# Patient Record
Sex: Male | Born: 1955
Health system: Southern US, Community
[De-identification: ages and names within clinical notes are randomized; demographics above are authoritative.]

## PROBLEM LIST (undated history)

## (undated) DIAGNOSIS — F32A Depression, unspecified: Secondary | ICD-10-CM

## (undated) DIAGNOSIS — M109 Gout, unspecified: Secondary | ICD-10-CM

## (undated) DIAGNOSIS — I2510 Atherosclerotic heart disease of native coronary artery without angina pectoris: Secondary | ICD-10-CM

## (undated) DIAGNOSIS — K8020 Calculus of gallbladder without cholecystitis without obstruction: Secondary | ICD-10-CM

## (undated) DIAGNOSIS — I480 Paroxysmal atrial fibrillation: Secondary | ICD-10-CM

## (undated) DIAGNOSIS — N189 Chronic kidney disease, unspecified: Secondary | ICD-10-CM

## (undated) DIAGNOSIS — K219 Gastro-esophageal reflux disease without esophagitis: Secondary | ICD-10-CM

## (undated) DIAGNOSIS — I10 Essential (primary) hypertension: Secondary | ICD-10-CM

## (undated) DIAGNOSIS — J45909 Unspecified asthma, uncomplicated: Secondary | ICD-10-CM

## (undated) DIAGNOSIS — F419 Anxiety disorder, unspecified: Secondary | ICD-10-CM

## (undated) DIAGNOSIS — M1990 Unspecified osteoarthritis, unspecified site: Secondary | ICD-10-CM

## (undated) DIAGNOSIS — T7840XA Allergy, unspecified, initial encounter: Secondary | ICD-10-CM

## (undated) DIAGNOSIS — E785 Hyperlipidemia, unspecified: Secondary | ICD-10-CM

## (undated) HISTORY — PX: CORONARY ANGIOPLASTY WITH STENT PLACEMENT: SHX49

## (undated) HISTORY — DX: Depression, unspecified: F32.A

## (undated) HISTORY — DX: Unspecified osteoarthritis, unspecified site: M19.90

## (undated) HISTORY — DX: Allergy, unspecified, initial encounter: T78.40XA

## (undated) HISTORY — DX: Unspecified asthma, uncomplicated: J45.909

## (undated) HISTORY — DX: Chronic kidney disease, unspecified: N18.9

## (undated) HISTORY — DX: Gastro-esophageal reflux disease without esophagitis: K21.9

## (undated) HISTORY — PX: TONSILLECTOMY: SUR1361

## (undated) HISTORY — PX: BLADDER REPAIR: SHX76

## (undated) HISTORY — DX: Anxiety disorder, unspecified: F41.9

---

## 2009-09-10 DIAGNOSIS — I219 Acute myocardial infarction, unspecified: Secondary | ICD-10-CM

## 2009-09-10 HISTORY — DX: Acute myocardial infarction, unspecified: I21.9

## 2013-11-16 ENCOUNTER — Emergency Department (HOSPITAL_BASED_OUTPATIENT_CLINIC_OR_DEPARTMENT_OTHER): Payer: 59

## 2013-11-16 ENCOUNTER — Emergency Department (HOSPITAL_BASED_OUTPATIENT_CLINIC_OR_DEPARTMENT_OTHER)
Admission: EM | Admit: 2013-11-16 | Discharge: 2013-11-16 | Disposition: A | Discharge status: Home / Self Care | Payer: 59 | Attending: Emergency Medicine | Admitting: Emergency Medicine

## 2013-11-16 ENCOUNTER — Encounter (HOSPITAL_BASED_OUTPATIENT_CLINIC_OR_DEPARTMENT_OTHER): Payer: Self-pay | Admitting: Emergency Medicine

## 2013-11-16 DIAGNOSIS — M79642 Pain in left hand: Secondary | ICD-10-CM

## 2013-11-16 DIAGNOSIS — M109 Gout, unspecified: Secondary | ICD-10-CM

## 2013-11-16 DIAGNOSIS — I10 Essential (primary) hypertension: Secondary | ICD-10-CM | POA: Insufficient documentation

## 2013-11-16 DIAGNOSIS — Z79899 Other long term (current) drug therapy: Secondary | ICD-10-CM | POA: Insufficient documentation

## 2013-11-16 HISTORY — DX: Essential (primary) hypertension: I10

## 2013-11-16 HISTORY — DX: Hyperlipidemia, unspecified: E78.5

## 2013-11-16 HISTORY — DX: Gout, unspecified: M10.9

## 2013-11-16 LAB — BASIC METABOLIC PANEL
BUN: 16 mg/dL (ref 6–23)
CO2: 25 mEq/L (ref 19–32)
Calcium: 10.1 mg/dL (ref 8.4–10.5)
Chloride: 101 mEq/L (ref 96–112)
Creatinine, Ser: 0.8 mg/dL (ref 0.50–1.35)
Glucose, Bld: 97 mg/dL (ref 70–99)
POTASSIUM: 4.7 meq/L (ref 3.7–5.3)
Sodium: 139 mEq/L (ref 137–147)

## 2013-11-16 MED ORDER — PREDNISONE 50 MG PO TABS: 60.0000 mg | ORAL_TABLET | Freq: Once | ORAL | Status: DC

## 2013-11-16 MED ORDER — PREDNISONE 20 MG PO TABS: ORAL_TABLET | ORAL | Status: DC

## 2013-11-16 MED ORDER — COLCHICINE 0.6 MG PO TABS: 1.2000 mg | ORAL_TABLET | Freq: Once | ORAL | Status: DC

## 2013-11-16 MED ORDER — PREDNISONE 50 MG PO TABS
60.0000 mg | ORAL_TABLET | Freq: Once | ORAL | Status: AC
  Administered 2013-11-16: 60 mg via ORAL
  Filled 2013-11-16 (×2): qty 1

## 2013-11-16 NOTE — ED Provider Notes (Signed)
CSN: [id number]     Arrival date & time 11/16/13  [id number] History   First MD Initiated Contact with Patient 11/16/13 [phone]     Chief Complaint  Patient presents with  . left hand pain ?gout      (Consider location/radiation/quality/duration/timing/severity/associated sxs/prior Treatment) HPI Comments: 58 year old male presents with 3 days of left hand and wrist pain and swelling. He states it feels like his prior gout. He's been diagnosed with gout typically has flares in his right foot in his left hand. States his head and his left hand similar to this about 4 or 5 times. He states the pain is currently a 10 out of 10. He has been taking Excedrin and ibuprofen. He's been trying to cut back on the ibuprofen. Typically ibuprofen is enough in his past flares. He states he's been to his doctor before and gotten an injection. Denies any injury. It hurts to move his hand. There's been no erythema or fevers. No wounds.   Past Medical History  Diagnosis Date  . Hypertension   . Gout   . Hyperlipidemia    History reviewed. No pertinent past surgical history. History reviewed. No pertinent family history. History  Substance Use Topics  . Smoking status: Never Smoker   . Smokeless tobacco: Not on file  . Alcohol Use: No    Review of Systems  Constitutional: Negative for fever.  Musculoskeletal: Positive for joint swelling.  Skin: Negative for color change and wound.      Allergies  Review of patient's allergies indicates no known allergies.  Home Medications   Current Outpatient Rx  Name  Route  Sig  Dispense  Refill  . losartan (COZAAR) 50 MG tablet   Oral   Take 50 mg by mouth 2 (two) times daily.          BP 140/92  Pulse 84  Temp(Src) 97.9 F (36.6 C) (Oral)  Resp 20  SpO2 98% Physical Exam  Nursing note and vitals reviewed. Constitutional: He is oriented to person, place, and time. He appears well-developed and well-nourished.  HENT:  Head: Normocephalic and  atraumatic.  Right Ear: External ear normal.  Left Ear: External ear normal.  Nose: Nose normal.  Eyes: Right eye exhibits no discharge. Left eye exhibits no discharge.  Cardiovascular: Normal rate and intact distal pulses.   Pulses:      Radial pulses are 2+ on the right side, and 2+ on the left side.  Pulmonary/Chest: Effort normal.  Abdominal: He exhibits no distension.  Musculoskeletal:       Left wrist: He exhibits tenderness and swelling (mild).       Left hand: He exhibits tenderness and swelling (diffuse).  Neurological: He is alert and oriented to person, place, and time.  Skin: Skin is warm and dry.    ED Course  Procedures (including critical care time) Labs Review Labs Reviewed  BASIC METABOLIC PANEL   Imaging Review Dg Forearm Left  11/16/2013   CLINICAL DATA:  Pain and swelling  EXAM: LEFT FOREARM - 2 VIEW  COMPARISON:  None.  FINDINGS: Frontal and lateral views were obtained. There is no fracture or dislocation. No abnormal periosteal reaction. There is slight osteoarthritic change in the wrist and elbow joints. No erosive change or intra-articular calcification.  IMPRESSION: Areas of mild osteoarthritic change. No erosive change or intra-articular calcification. No fracture or dislocation.   Electronically Signed   By: Bretta BangWilliam  Woodruff M.D.   On: 11/16/2013 09:59   Dg Hand Complete  Left  11/16/2013   CLINICAL DATA:  Pain and swelling  EXAM: LEFT HAND - COMPLETE 3+ VIEW  COMPARISON:  None.  FINDINGS: Frontal, oblique, and lateral views were obtained. There is no fracture or dislocation. There is mild osteoarthritic change in all PIP and DIP joints. There is also mild osteoarthritic change in the midcarpal -metacarpal region and in the radiocarpal joint. There is no erosive change or intra-articular calcification.  IMPRESSION: Multifocal osteoarthritic change. No erosive change or intra-articular calcification. No fracture or dislocation.   Electronically Signed   By: Bretta Bang M.D.   On: 11/16/2013 09:59     EKG Interpretation None      MDM   Final diagnoses:  Left hand pain  Gout    Patient's hand does not have clinical signs of infection. The patient states is a recurrent problem for him and states she's been told his gout has not had formal testing. I do not see any acute indication for arthrocentesis. There is no sign of any joint effusion. Given that his symptoms started over 72 hours ago I do not feel colchicine would be helpful. History taking NSAIDs without significant relief. At this time we'll give burst of prednisone and recommend close followup with his PCP.    Audree Camel, MD 11/16/13 [phone]

## 2013-11-16 NOTE — ED Notes (Signed)
Left hand pain and swelling onset Friday pt states history of gout and is always in left hand or right foot

## 2020-04-09 ENCOUNTER — Observation Stay (HOSPITAL_COMMUNITY)
Admission: EM | Admit: 2020-04-09 | Discharge: 2020-04-11 | Disposition: A | Discharge status: Home / Self Care | Payer: Medicare Other | Attending: Internal Medicine | Admitting: Internal Medicine

## 2020-04-09 ENCOUNTER — Other Ambulatory Visit: Payer: Self-pay

## 2020-04-09 ENCOUNTER — Encounter (HOSPITAL_COMMUNITY): Payer: Self-pay | Admitting: Emergency Medicine

## 2020-04-09 DIAGNOSIS — R1013 Epigastric pain: Secondary | ICD-10-CM | POA: Diagnosis present

## 2020-04-09 DIAGNOSIS — I10 Essential (primary) hypertension: Secondary | ICD-10-CM | POA: Insufficient documentation

## 2020-04-09 DIAGNOSIS — I2510 Atherosclerotic heart disease of native coronary artery without angina pectoris: Secondary | ICD-10-CM | POA: Insufficient documentation

## 2020-04-09 DIAGNOSIS — I160 Hypertensive urgency: Secondary | ICD-10-CM

## 2020-04-09 DIAGNOSIS — Z7982 Long term (current) use of aspirin: Secondary | ICD-10-CM | POA: Diagnosis not present

## 2020-04-09 DIAGNOSIS — R079 Chest pain, unspecified: Secondary | ICD-10-CM

## 2020-04-09 DIAGNOSIS — Z20822 Contact with and (suspected) exposure to covid-19: Secondary | ICD-10-CM | POA: Diagnosis not present

## 2020-04-09 DIAGNOSIS — K625 Hemorrhage of anus and rectum: Secondary | ICD-10-CM | POA: Diagnosis not present

## 2020-04-09 DIAGNOSIS — R109 Unspecified abdominal pain: Secondary | ICD-10-CM | POA: Diagnosis present

## 2020-04-09 HISTORY — DX: Paroxysmal atrial fibrillation: I48.0

## 2020-04-09 HISTORY — DX: Atherosclerotic heart disease of native coronary artery without angina pectoris: I25.10

## 2020-04-09 HISTORY — DX: Calculus of gallbladder without cholecystitis without obstruction: K80.20

## 2020-04-09 LAB — COMPREHENSIVE METABOLIC PANEL
ALT: 48 U/L — ABNORMAL HIGH (ref 0–44)
AST: 47 U/L — ABNORMAL HIGH (ref 15–41)
Albumin: 4 g/dL (ref 3.5–5.0)
Alkaline Phosphatase: 75 U/L (ref 38–126)
Anion gap: 12 (ref 5–15)
BUN: 33 mg/dL — ABNORMAL HIGH (ref 8–23)
CO2: 22 mmol/L (ref 22–32)
Calcium: 9.2 mg/dL (ref 8.9–10.3)
Chloride: 109 mmol/L (ref 98–111)
Creatinine, Ser: 2.06 mg/dL — ABNORMAL HIGH (ref 0.61–1.24)
GFR calc Af Amer: 38 mL/min — ABNORMAL LOW (ref >60)
GFR calc non Af Amer: 33 mL/min — ABNORMAL LOW (ref >60)
Glucose, Bld: 101 mg/dL — ABNORMAL HIGH (ref 70–99)
Potassium: 3.4 mmol/L — ABNORMAL LOW (ref 3.5–5.1)
Sodium: 143 mmol/L (ref 135–145)
Total Bilirubin: 0.8 mg/dL (ref 0.3–1.2)
Total Protein: 6.5 g/dL (ref 6.5–8.1)

## 2020-04-09 LAB — URINALYSIS, ROUTINE W REFLEX MICROSCOPIC
Bilirubin Urine: NEGATIVE
Glucose, UA: NEGATIVE mg/dL
Hgb urine dipstick: NEGATIVE
Ketones, ur: NEGATIVE mg/dL
Leukocytes,Ua: NEGATIVE
Nitrite: NEGATIVE
Protein, ur: NEGATIVE mg/dL
Specific Gravity, Urine: 1.026 (ref 1.005–1.030)
pH: 5 (ref 5.0–8.0)

## 2020-04-09 LAB — CBC
HCT: 45.3 % (ref 39.0–52.0)
Hemoglobin: 14.8 g/dL (ref 13.0–17.0)
MCH: 30 pg (ref 26.0–34.0)
MCHC: 32.7 g/dL (ref 30.0–36.0)
MCV: 91.9 fL (ref 80.0–100.0)
Platelets: 275 10*3/uL (ref 150–400)
RBC: 4.93 MIL/uL (ref 4.22–5.81)
RDW: 12.8 % (ref 11.5–15.5)
WBC: 9.4 10*3/uL (ref 4.0–10.5)
nRBC: 0 % (ref 0.0–0.2)

## 2020-04-09 LAB — LIPASE, BLOOD: Lipase: 27 U/L (ref 11–51)

## 2020-04-09 MED ORDER — SODIUM CHLORIDE 0.9% FLUSH
3.0000 mL | Freq: Once | INTRAVENOUS | Status: AC
  Administered 2020-04-10: 3 mL via INTRAVENOUS

## 2020-04-09 NOTE — ED Triage Notes (Signed)
Pt. Stated, Dean Morris been N/V and stomach pain for a week. My stomach is on fie and I have end stage renal failure nothing coming out but blood.

## 2020-04-10 ENCOUNTER — Encounter (HOSPITAL_COMMUNITY): Payer: Self-pay | Admitting: Internal Medicine

## 2020-04-10 ENCOUNTER — Observation Stay (HOSPITAL_BASED_OUTPATIENT_CLINIC_OR_DEPARTMENT_OTHER): Payer: Medicare Other

## 2020-04-10 ENCOUNTER — Emergency Department (HOSPITAL_COMMUNITY): Payer: Medicare Other

## 2020-04-10 ENCOUNTER — Other Ambulatory Visit: Payer: Self-pay

## 2020-04-10 DIAGNOSIS — E785 Hyperlipidemia, unspecified: Secondary | ICD-10-CM

## 2020-04-10 DIAGNOSIS — I160 Hypertensive urgency: Secondary | ICD-10-CM | POA: Diagnosis not present

## 2020-04-10 DIAGNOSIS — R109 Unspecified abdominal pain: Secondary | ICD-10-CM | POA: Diagnosis present

## 2020-04-10 DIAGNOSIS — K625 Hemorrhage of anus and rectum: Principal | ICD-10-CM

## 2020-04-10 DIAGNOSIS — R079 Chest pain, unspecified: Secondary | ICD-10-CM | POA: Diagnosis not present

## 2020-04-10 DIAGNOSIS — R1013 Epigastric pain: Secondary | ICD-10-CM

## 2020-04-10 DIAGNOSIS — R1030 Lower abdominal pain, unspecified: Secondary | ICD-10-CM | POA: Diagnosis not present

## 2020-04-10 DIAGNOSIS — K8080 Other cholelithiasis without obstruction: Secondary | ICD-10-CM

## 2020-04-10 LAB — CREATININE, SERUM
Creatinine, Ser: 1.51 mg/dL — ABNORMAL HIGH (ref 0.61–1.24)
GFR calc Af Amer: 55 mL/min — ABNORMAL LOW (ref >60)
GFR calc non Af Amer: 48 mL/min — ABNORMAL LOW (ref >60)

## 2020-04-10 LAB — TROPONIN I (HIGH SENSITIVITY)
Troponin I (High Sensitivity): 33 ng/L — ABNORMAL HIGH (ref <18)
Troponin I (High Sensitivity): 27 ng/L — ABNORMAL HIGH (ref <18)

## 2020-04-10 LAB — CBC
HCT: 43.6 % (ref 39.0–52.0)
Hemoglobin: 13.7 g/dL (ref 13.0–17.0)
MCH: 29.1 pg (ref 26.0–34.0)
MCHC: 31.4 g/dL (ref 30.0–36.0)
MCV: 92.6 fL (ref 80.0–100.0)
Platelets: 206 10*3/uL (ref 150–400)
RBC: 4.71 MIL/uL (ref 4.22–5.81)
RDW: 12.8 % (ref 11.5–15.5)
WBC: 6.6 10*3/uL (ref 4.0–10.5)
nRBC: 0 % (ref 0.0–0.2)

## 2020-04-10 LAB — ECHOCARDIOGRAM COMPLETE
AR max vel: 2.83 cm2
AV Area VTI: 2.47 cm2
AV Area mean vel: 2.88 cm2
AV Mean grad: 3.5 mmHg
AV Peak grad: 7.1 mmHg
Ao pk vel: 1.34 m/s
Area-P 1/2: 2.84 cm2
Calc EF: 64.4 %
S' Lateral: 2.5 cm
Single Plane A2C EF: 69.9 %
Single Plane A4C EF: 55.3 %

## 2020-04-10 LAB — PROTIME-INR
INR: 1 (ref 0.8–1.2)
Prothrombin Time: 12.7 seconds (ref 11.4–15.2)

## 2020-04-10 LAB — LACTIC ACID, PLASMA: Lactic Acid, Venous: 1.3 mmol/L (ref 0.5–1.9)

## 2020-04-10 LAB — BASIC METABOLIC PANEL
Anion gap: 9 (ref 5–15)
BUN: 26 mg/dL — ABNORMAL HIGH (ref 8–23)
CO2: 24 mmol/L (ref 22–32)
Calcium: 8.7 mg/dL — ABNORMAL LOW (ref 8.9–10.3)
Chloride: 109 mmol/L (ref 98–111)
Creatinine, Ser: 1.58 mg/dL — ABNORMAL HIGH (ref 0.61–1.24)
GFR calc Af Amer: 52 mL/min — ABNORMAL LOW (ref >60)
GFR calc non Af Amer: 45 mL/min — ABNORMAL LOW (ref >60)
Glucose, Bld: 94 mg/dL (ref 70–99)
Potassium: 4 mmol/L (ref 3.5–5.1)
Sodium: 142 mmol/L (ref 135–145)

## 2020-04-10 LAB — HEPATIC FUNCTION PANEL
ALT: 40 U/L (ref 0–44)
AST: 31 U/L (ref 15–41)
Albumin: 3.5 g/dL (ref 3.5–5.0)
Alkaline Phosphatase: 60 U/L (ref 38–126)
Bilirubin, Direct: 0.1 mg/dL (ref 0.0–0.2)
Indirect Bilirubin: 0.8 mg/dL (ref 0.3–0.9)
Total Bilirubin: 0.9 mg/dL (ref 0.3–1.2)
Total Protein: 5.8 g/dL — ABNORMAL LOW (ref 6.5–8.1)

## 2020-04-10 LAB — TYPE AND SCREEN
ABO/RH(D): O POS
Antibody Screen: NEGATIVE

## 2020-04-10 LAB — HIV ANTIBODY (ROUTINE TESTING W REFLEX): HIV Screen 4th Generation wRfx: NONREACTIVE

## 2020-04-10 LAB — SARS CORONAVIRUS 2 BY RT PCR (HOSPITAL ORDER, PERFORMED IN ~~LOC~~ HOSPITAL LAB): SARS Coronavirus 2: NEGATIVE

## 2020-04-10 LAB — ABO/RH: ABO/RH(D): O POS

## 2020-04-10 LAB — LIPASE, BLOOD: Lipase: 29 U/L (ref 11–51)

## 2020-04-10 LAB — TSH: TSH: 1.235 u[IU]/mL (ref 0.350–4.500)

## 2020-04-10 MED ORDER — ACETAMINOPHEN 325 MG PO TABS: 650.0000 mg | ORAL_TABLET | Freq: Four times a day (QID) | ORAL | Status: DC | PRN

## 2020-04-10 MED ORDER — MORPHINE SULFATE (PF) 4 MG/ML IV SOLN
4.0000 mg | Freq: Once | INTRAVENOUS | Status: AC
  Administered 2020-04-10: 4 mg via INTRAVENOUS
  Filled 2020-04-10: qty 1

## 2020-04-10 MED ORDER — SODIUM CHLORIDE 0.9 % IV BOLUS
500.0000 mL | Freq: Once | INTRAVENOUS | Status: AC
  Administered 2020-04-10: 500 mL via INTRAVENOUS

## 2020-04-10 MED ORDER — ENOXAPARIN SODIUM 40 MG/0.4ML ~~LOC~~ SOLN
40.0000 mg | SUBCUTANEOUS | Status: DC
  Administered 2020-04-10: 40 mg via SUBCUTANEOUS
  Filled 2020-04-10: qty 0.4

## 2020-04-10 MED ORDER — ONDANSETRON HCL 4 MG PO TABS: 4.0000 mg | ORAL_TABLET | Freq: Four times a day (QID) | ORAL | Status: DC | PRN

## 2020-04-10 MED ORDER — ONDANSETRON HCL 4 MG/2ML IJ SOLN: 4.0000 mg | Freq: Four times a day (QID) | INTRAMUSCULAR | Status: DC | PRN

## 2020-04-10 MED ORDER — AMLODIPINE BESYLATE 5 MG PO TABS
5.0000 mg | ORAL_TABLET | Freq: Every day | ORAL | Status: DC
  Administered 2020-04-10: 5 mg via ORAL
  Filled 2020-04-10 (×2): qty 1

## 2020-04-10 MED ORDER — PANTOPRAZOLE SODIUM 40 MG IV SOLR
40.0000 mg | Freq: Two times a day (BID) | INTRAVENOUS | Status: DC
  Administered 2020-04-10 – 2020-04-11 (×3): 40 mg via INTRAVENOUS
  Filled 2020-04-10 (×3): qty 40

## 2020-04-10 MED ORDER — SODIUM CHLORIDE 0.9 % IV SOLN: INTRAVENOUS | Status: AC

## 2020-04-10 MED ORDER — ONDANSETRON HCL 4 MG/2ML IJ SOLN
4.0000 mg | Freq: Once | INTRAMUSCULAR | Status: AC
Start: 2020-04-10 — End: 2020-04-10
  Administered 2020-04-10: 4 mg via INTRAVENOUS
  Filled 2020-04-10: qty 2

## 2020-04-10 MED ORDER — ACETAMINOPHEN 650 MG RE SUPP: 650.0000 mg | Freq: Four times a day (QID) | RECTAL | Status: DC | PRN

## 2020-04-10 MED ORDER — SODIUM CHLORIDE 0.9% FLUSH
3.0000 mL | Freq: Two times a day (BID) | INTRAVENOUS | Status: DC
  Administered 2020-04-10 – 2020-04-11 (×2): 3 mL via INTRAVENOUS

## 2020-04-10 MED ORDER — LABETALOL HCL 5 MG/ML IV SOLN: 10.0000 mg | Freq: Once | INTRAVENOUS | Status: DC

## 2020-04-10 NOTE — Progress Notes (Signed)
  Echocardiogram 2D Echocardiogram has been performed.  Janalyn Harder 04/10/2020, 11:16 AM

## 2020-04-10 NOTE — H&P (Signed)
History and Physical    Dean Morris OMV:[id number] DOB: 1956/08/13 DOA: 04/09/2020  PCP: Patient, No Pcp Per Patient coming from: Home Chief Complaint: Abdominal pain  HPI: Dean Morris is a 64 y.o. male with medical history significant of paroxysmal Afib, heart disease with stent in place, HTN, HLD, gout, cholelithiasis who presents for abdominal pain.  Of note, patient has not been seen by a physician for about 1 year and has been off of all medications beyond a baby aspirin since that time.  [name] reports that he presented to the ED this evening for stomach pain for about 5 days.  The pain is sharp in nature and in the lower quadrants of his abdomen.  He has further had emesis and retching over that time along with fatigue.  He reports night sweats and a 60# weight loss in the last 3 months.  He has also had rectal bleeding, bright red blood which he noted in the toilet bowel.  He has chronic constipation.  He notes that he further has "kidney pain" which causes him to "walk crooked" in the morning.  It takes him most of the day to straighten out.  He attributes this to his chronic kidney disease.  He notes that he used to take too much ibuprofen and this is why he has kidney disease.  He denies fever, chills, emesis while in the ED, heartburn.  He reported to the EDP that he was having chest pain, however, he denied this to me.  He noted some epigastric pain and some right sided chest pain at times, but none at this time.  Of note, his history is tangential and difficult to follow.  He notes recent pain medication as reason for his foggy headedness.  He denies dizziness, headache, rash or wounds on his skin.   ED Course: In the ED, he was noted to have a K of 3.4, BUN of 33, Cr of 2.06 (unknown baseline, 2018 Cr was 1.26 in Care Everywhere), Normal WBC and H/H.  TnI was 33 --> 27.  CT abdomen showed gallstones and small hernia.  US of the abdomen showed gallstones.  EKG showed RBBB and LAFB, no  previous to compare.    Review of Systems: As per HPI otherwise all other systems reviewed and are negative.   Past Medical History:  Diagnosis Date  . CAD (coronary artery disease)    stent  . Cholelithiasis   . Gout   . Hyperlipidemia   . Hypertension   . Paroxysmal A-fib [facility])     Past Surgical History:  Procedure Laterality Date  . BLADDER REPAIR     unknown procedure  . CORONARY ANGIOPLASTY WITH STENT PLACEMENT      Social History  reports that he has never smoked. He has never used smokeless tobacco. He reports that he does not drink alcohol and does not use drugs.  No Known Allergies  Family History  Problem Relation Age of Onset  . Heart failure Mother   . Heart failure Brother    Medications Aspirin 81mg  daily  Physical Exam: Vitals:   04/10/20 0300 04/10/20 0315 04/10/20 0415 04/10/20 0515  BP: (!) 148/90 (!) 141/92 (!) 146/99 (!) 146/96  Pulse: 73 66 66 65  Resp: 17 (!) 10 13 14   Temp:      SpO2: 100% 99% 100% 100%    Constitutional: NAD, calm, comfortable, lethargic Eyes: PERRL, conjunctival injection bilaterally, no icterus ENMT: Mucous membranes are mildly dry Neck: normal, supple Respiratory:  CTAB, no wheezing or rales, normal effort Cardiovascular: RR, NR, no murmur, no peripheral edema Abdomen: + TTP over the epigastrium, lower quadrants, +BS.  He has a small umbilical hernia which is not reducible, tender when palpated.  Musculoskeletal: no clubbing / cyanosis. No contractures.  Skin: no rashes, lesions, ulcers on exposed skin.  Rosacea changes on nose.  Neurologic: Grossly intact, moving easily in bed.  Psychiatric: Poor insight.  Unclear if this is baseline or related to medications.  Alert and oriented.    Labs on Admission: I have personally reviewed following labs and imaging studies  CBC: Recent Labs  Lab 04/09/20 1348  WBC 9.4  HGB 14.8  HCT 45.3  MCV 91.9  PLT 275    Basic Metabolic Panel: Recent Labs  Lab  04/09/20 1348  NA 143  K 3.4*  CL 109  CO2 22  GLUCOSE 101*  BUN 33*  CREATININE 2.06*  CALCIUM 9.2    GFR: CrCl cannot be calculated (Unknown ideal weight.).  Liver Function Tests: Recent Labs  Lab 04/09/20 1348  AST 47*  ALT 48*  ALKPHOS 75  BILITOT 0.8  PROT 6.5  ALBUMIN 4.0    Urine analysis:    Component Value Date/Time   COLORURINE YELLOW 04/09/2020 2027   APPEARANCEUR CLEAR 04/09/2020 2027   LABSPEC 1.026 04/09/2020 2027   PHURINE 5.0 04/09/2020 2027   GLUCOSEU NEGATIVE 04/09/2020 2027   HGBUR NEGATIVE 04/09/2020 2027   BILIRUBINUR NEGATIVE 04/09/2020 2027   KETONESUR NEGATIVE 04/09/2020 2027   PROTEINUR NEGATIVE 04/09/2020 2027   NITRITE NEGATIVE 04/09/2020 2027   LEUKOCYTESUR NEGATIVE 04/09/2020 2027    Radiological Exams on Admission: CT ABDOMEN PELVIS WO CONTRAST  Result Date: 04/10/2020 CLINICAL DATA:  Abdominal pain EXAM: CT ABDOMEN AND PELVIS WITHOUT CONTRAST TECHNIQUE: Multidetector CT imaging of the abdomen and pelvis was performed following the standard protocol without IV contrast. COMPARISON:  None. FINDINGS: Lower chest: The visualized heart size within normal limits. No pericardial fluid/thickening. No hiatal hernia. The visualized portions of the lungs are clear. Hepatobiliary: Although limited due to the lack of intravenous contrast, normal in appearance without gross focal abnormality. Layering calcified gallstones are present. Pancreas:  Unremarkable.  No surrounding inflammatory changes. Spleen: Normal in size. Although limited due to the lack of intravenous contrast, normal in appearance. Adrenals/Urinary Tract: Both adrenal glands appear normal. A low-density 3 cm lesion seen in the upper pole the left kidney. No renal or collecting system calculi are noted. The bladder is unremarkable. Stomach/Bowel: The stomach, small bowel, and colon are normal in appearance. No inflammatory changes or obstructive findings. appendix is normal.  Vascular/Lymphatic: There are no enlarged abdominal or pelvic lymph nodes. Scattered dense aortic atherosclerosis is seen. Reproductive: The prostate is unremarkable. Other: There is a small right-sided fat containing inguinal hernia present. A small anterior umbilical hernia containing fat is noted. Musculoskeletal: No acute or significant osseous findings. IMPRESSION: No acute intra-abdominal or pelvic pathology to explain the patient's symptoms. Cholelithiasis Small fat containing anterior abdominal wall hernia and right inguinal hernia. Aortic Atherosclerosis (ICD10-I70.0). Electronically Signed   By: Jonna Clark M.D.   On: 04/10/2020 02:28   DG Chest Port 1 View  Result Date: 04/10/2020 CLINICAL DATA:  Chest pain EXAM: PORTABLE CHEST 1 VIEW COMPARISON:  09/11/2016 FINDINGS: The heart size and mediastinal contours are within normal limits. Both lungs are clear. The visualized skeletal structures are unremarkable. IMPRESSION: No active disease. Electronically Signed   By: Alcide Clever M.D.   On: 04/10/2020  00:59   US ABDOMEN LIMITED RUQ  Result Date: 04/10/2020 CLINICAL DATA:  Follow-up cholelithiasis and abdominal pain EXAM: ULTRASOUND ABDOMEN LIMITED RIGHT UPPER QUADRANT COMPARISON:  CT from earlier in the same day. FINDINGS: Gallbladder: Gallbladder is well distended with multiple gallstones within. No wall thickening or pericholecystic fluid is noted. Negative sonographic Murphy's sign is elicited. Common bile duct: Diameter: 5.2 mm. Liver: Diffusely increased in echogenicity consistent with fatty infiltration. Portal vein is patent on color Doppler imaging with normal direction of blood flow towards the liver. Other: None. IMPRESSION: Cholelithiasis without complicating factors. Fatty infiltration of the liver. Electronically Signed   By: Alcide Clever M.D.   On: 04/10/2020 03:59    EKG: Independently reviewed. RBBB, LAFB.  Unknown age of changes  Assessment/Plan  Abdominal pain Rectal  bleeding Weight loss - Unclear cause, CT abdomen does not show an anatomical reason - Consider outpatient GI consult for colonoscopy given age - Hemocult was negative in the ED - IVF with NS at 100cc/hr - Nausea control with Zofran - Trend H/H - Lipase normal - Very mild bump in ALT, AST noted  Heart disease Reported chest pain - EKG with changes, unknown age of onset - Troponin flat - Monitor on telemetry overnight  CKD, ? Acute on chronic - IVF with NS at 100cc/hr - UA to check for protein - Outpatient follow up  Hypertensive urgency - Initially very high, EDP saw numbers as high as 220 systolic - Possible chest pain or worsening AKI associated - Start low dose lisinopril and monitor - Set up with a PCP  HLD - Check lipids - Consider starting statin  Gallstones - Consider OP surgery consult  If patient doing well after breakfast, can likely return home with new PCP assigned and assistance getting his medications.    DVT prophylaxis: Lovenox  Code Status:   Full Disposition Plan:   Patient is from:  Home  Anticipated DC to:  Home  Anticipated DC date:  04/10/2020  Anticipated DC barriers: None  Consults called:  None Admission status:  Obs, Tele   Severity of Illness: The appropriate patient status for this patient is OBSERVATION. Observation status is judged to be reasonable and necessary in order to provide the required intensity of service to ensure the patient's safety. The patient's presenting symptoms, physical exam findings, and initial radiographic and laboratory data in the context of their medical condition is felt to place them at decreased risk for further clinical deterioration. Furthermore, it is anticipated that the patient will be medically stable for discharge from the hospital within 2 midnights of admission. The following factors support the patient status of observation.   " The patient's presenting symptoms include abdominal pain. " The physical  exam findings include abdominal pain. " The initial radiographic and laboratory data are CKD, possibly acute on chronic CKD.      Debe Coder MD [facility]  How to contact the [facility] Attending or Consulting provider 7A - 7P or covering provider during after hours 7P -7A, for this patient?   1. Check the care team in [facility] and look for a) attending/consulting [facility] provider listed and b) the [facility] team listed 2. Log into [email or web address] and use [place]'s universal password to access. If you do not have the password, please contact the hospital operator. 3. Locate the [facility] provider you are looking for under [facility] and page to a number that you can be directly reached. 4. If you still have difficulty reaching the  provider, please page the [facility] (Director on Call) for the Hospitalists listed on amion for assistance.  04/10/2020, 6:03 AM

## 2020-04-10 NOTE — ED Notes (Signed)
Patient transported to Ultrasound 

## 2020-04-10 NOTE — ED Provider Notes (Signed)
[facility] EMERGENCY DEPARTMENT Provider Note   CSN: [id number] Arrival date & time: 04/09/20  1305     History Chief Complaint  Patient presents with  . Emesis  . Nausea  . Back Pain  . Abdominal Pain    Dean Morris is a 64 y.o. male.  Patient presents to the emergency department for evaluation of abdominal pain.  Patient reports that has been hurting for approximately a week.  He reports a constant burning pain and distention across his abdomen.  He has had nausea and vomiting.  Vomit has been nonbloody, no coffee grounds.  He has, however, for the last 2 or 3 days been passing bright red blood per rectum.  He reports that he has been having intermittent substernal chest pain as well.  He has a history of hypertension but has been off of his medications for some time because of insurance issues.  He only takes a single low-dose aspirin daily.        Past Medical History:  Diagnosis Date  . Gout   . Hyperlipidemia   . Hypertension     There are no problems to display for this patient.   History reviewed. No pertinent surgical history.     No family history on file.  Social History   Tobacco Use  . Smoking status: Never Smoker  . Smokeless tobacco: Never Used  Substance Use Topics  . Alcohol use: No  . Drug use: No    Home Medications Prior to Admission medications   Medication Sig Start Date End Date Taking? Authorizing Provider  losartan (COZAAR) 50 MG tablet Take 50 mg by mouth 2 (two) times daily.    [provider]  predniSONE (DELTASONE) 20 MG tablet 2 tabs po daily x 4 days 11/17/13   Pricilla Loveless, MD    Allergies    Patient has no known allergies.  Review of Systems   Review of Systems  Cardiovascular: Positive for chest pain.  Gastrointestinal: Positive for abdominal pain, blood in stool, nausea and vomiting.  All other systems reviewed and are negative.   Physical Exam Updated Vital Signs BP (!) 146/99    Pulse 66   Temp 99 F (37.2 C)   Resp 13   SpO2 100%   Physical Exam Vitals and nursing note reviewed.  Constitutional:      General: He is not in acute distress.    Appearance: Normal appearance. He is well-developed.  HENT:     Head: Normocephalic and atraumatic.     Right Ear: Hearing normal.     Left Ear: Hearing normal.     Nose: Nose normal.  Eyes:     Conjunctiva/sclera: Conjunctivae normal.     Pupils: Pupils are equal, round, and reactive to light.  Cardiovascular:     Rate and Rhythm: Regular rhythm.     Heart sounds: S1 normal and S2 normal. No murmur heard.  No friction rub. No gallop.   Pulmonary:     Effort: Pulmonary effort is normal. No respiratory distress.     Breath sounds: Normal breath sounds.  Chest:     Chest wall: No tenderness.  Abdominal:     General: Abdomen is protuberant. Bowel sounds are decreased.     Palpations: Abdomen is soft.     Tenderness: There is generalized abdominal tenderness. There is no guarding or rebound. Negative signs include Murphy's sign and McBurney's sign.     Hernia: A hernia is present. Hernia is present  in the umbilical area.  Genitourinary:    Prostate: Normal.     Rectum: Guaiac result negative. No mass or tenderness.  Musculoskeletal:        General: Normal range of motion.     Cervical back: Normal range of motion and neck supple.  Skin:    General: Skin is warm and dry.     Findings: No rash.  Neurological:     Mental Status: He is alert and oriented to person, place, and time.     GCS: GCS eye subscore is 4. GCS verbal subscore is 5. GCS motor subscore is 6.     Cranial Nerves: No cranial nerve deficit.     Sensory: No sensory deficit.     Coordination: Coordination normal.  Psychiatric:        Speech: Speech normal.        Behavior: Behavior normal.        Thought Content: Thought content normal.     ED Results / Procedures / Treatments   Labs (all labs ordered are listed, but only abnormal results  are displayed) Labs Reviewed  COMPREHENSIVE METABOLIC PANEL - Abnormal; Notable for the following components:      Result Value   Potassium 3.4 (*)    Glucose, Bld 101 (*)    BUN 33 (*)    Creatinine, Ser 2.06 (*)    AST 47 (*)    ALT 48 (*)    GFR calc non Af Amer 33 (*)    GFR calc Af Amer 38 (*)    All other components within normal limits  TROPONIN I (HIGH SENSITIVITY) - Abnormal; Notable for the following components:   Troponin I (High Sensitivity) 33 (*)    All other components within normal limits  TROPONIN I (HIGH SENSITIVITY) - Abnormal; Notable for the following components:   Troponin I (High Sensitivity) 27 (*)    All other components within normal limits  SARS CORONAVIRUS 2 BY RT PCR (HOSPITAL ORDER, PERFORMED IN [facility])  LIPASE, BLOOD  CBC  URINALYSIS, ROUTINE W REFLEX MICROSCOPIC  LACTIC ACID, PLASMA  PROTIME-INR  POC OCCULT BLOOD, ED  TYPE AND SCREEN  ABO/RH    EKG EKG Interpretation  Date/Time:  Sunday April 10 2020 01:14:44 EDT Ventricular Rate:  69 PR Interval:    QRS Duration: 127 QT Interval:  432 QTC Calculation: 463 R Axis:   -41 Text Interpretation: Sinus rhythm Ventricular premature complex RBBB and LAFB Abnrm T, consider ischemia, anterolateral lds No previous tracing Confirmed by Gilda Crease [phone]) on 04/10/2020 2:51:49 AM   Radiology CT ABDOMEN PELVIS WO CONTRAST  Result Date: 04/10/2020 CLINICAL DATA:  Abdominal pain EXAM: CT ABDOMEN AND PELVIS WITHOUT CONTRAST TECHNIQUE: Multidetector CT imaging of the abdomen and pelvis was performed following the standard protocol without IV contrast. COMPARISON:  None. FINDINGS: Lower chest: The visualized heart size within normal limits. No pericardial fluid/thickening. No hiatal hernia. The visualized portions of the lungs are clear. Hepatobiliary: Although limited due to the lack of intravenous contrast, normal in appearance without gross focal abnormality. Layering calcified  gallstones are present. Pancreas:  Unremarkable.  No surrounding inflammatory changes. Spleen: Normal in size. Although limited due to the lack of intravenous contrast, normal in appearance. Adrenals/Urinary Tract: Both adrenal glands appear normal. A low-density 3 cm lesion seen in the upper pole the left kidney. No renal or collecting system calculi are noted. The bladder is unremarkable. Stomach/Bowel: The stomach, small bowel, and colon are  normal in appearance. No inflammatory changes or obstructive findings. appendix is normal. Vascular/Lymphatic: There are no enlarged abdominal or pelvic lymph nodes. Scattered dense aortic atherosclerosis is seen. Reproductive: The prostate is unremarkable. Other: There is a small right-sided fat containing inguinal hernia present. A small anterior umbilical hernia containing fat is noted. Musculoskeletal: No acute or significant osseous findings. IMPRESSION: No acute intra-abdominal or pelvic pathology to explain the patient's symptoms. Cholelithiasis Small fat containing anterior abdominal wall hernia and right inguinal hernia. Aortic Atherosclerosis (ICD10-I70.0). Electronically Signed   By: Jonna Clark M.D.   On: 04/10/2020 02:28   DG Chest Port 1 View  Result Date: 04/10/2020 CLINICAL DATA:  Chest pain EXAM: PORTABLE CHEST 1 VIEW COMPARISON:  09/11/2016 FINDINGS: The heart size and mediastinal contours are within normal limits. Both lungs are clear. The visualized skeletal structures are unremarkable. IMPRESSION: No active disease. Electronically Signed   By: Alcide Clever M.D.   On: 04/10/2020 00:59   US ABDOMEN LIMITED RUQ  Result Date: 04/10/2020 CLINICAL DATA:  Follow-up cholelithiasis and abdominal pain EXAM: ULTRASOUND ABDOMEN LIMITED RIGHT UPPER QUADRANT COMPARISON:  CT from earlier in the same day. FINDINGS: Gallbladder: Gallbladder is well distended with multiple gallstones within. No wall thickening or pericholecystic fluid is noted. Negative sonographic  Murphy's sign is elicited. Common bile duct: Diameter: 5.2 mm. Liver: Diffusely increased in echogenicity consistent with fatty infiltration. Portal vein is patent on color Doppler imaging with normal direction of blood flow towards the liver. Other: None. IMPRESSION: Cholelithiasis without complicating factors. Fatty infiltration of the liver. Electronically Signed   By: Alcide Clever M.D.   On: 04/10/2020 03:59    Procedures Procedures (including critical care time)  Medications Ordered in ED Medications  labetalol (NORMODYNE) injection 10 mg (0 mg Intravenous Hold 04/10/20 0108)  sodium chloride flush (NS) 0.9 % injection 3 mL (3 mLs Intravenous Given 04/10/20 0119)  sodium chloride 0.9 % bolus 500 mL (0 mLs Intravenous Stopped 04/10/20 0256)  morphine 4 MG/ML injection 4 mg (4 mg Intravenous Given 04/10/20 0119)  ondansetron (ZOFRAN) injection 4 mg (4 mg Intravenous Given 04/10/20 0119)    ED Course  I have reviewed the triage vital signs and the nursing notes.  Pertinent labs & imaging results that were available during my care of the patient were reviewed by me and considered in my medical decision making (see chart for details).    MDM Rules/Calculators/A&P                          Patient presents to the emergency department for evaluation of nausea, vomiting, rectal bleeding.  Symptoms been ongoing for several days.  Patient now experiencing chest pain.  Patient does have a history of coronary artery disease, previous stenting to OM1 as well as hypertension and paroxysmal atrial fibrillation.  He is not anticoagulated, takes aspirin daily.  He has been off of his blood pressure medications.  Patient noted to be very hypertensive at arrival.  Highest blood pressure I saw was 230/120.  This was treated with pain management and labetalol.  Patient's troponins are slightly elevated but somewhat flat.  Elevations potentially secondary to elevated blood pressures, but with his previous CAD, will  require further monitoring.  He reports rectal bleeding for several days.  Hemoglobin is stable.  No previous history of GI bleeding, according to patient.  Rectal exam was negative for occult blood.  Stool was black but he has been using Pepto-Bismol.  Patient indicates that he has not had any bowel movement while here in the ED which is a number of hours.  Patient complaining of diffuse lower abdominal discomfort.  CT scan does not show obvious abnormality.  No sign of colitis/diverticulitis, no bowel obstruction.  Umbilical hernia only contains fat.  He does have a gallstone noted by ultrasound does not show any evidence of cholecystitis.  Final Clinical Impression(s) / ED Diagnoses Final diagnoses:  Chest pain, unspecified type  Hypertensive urgency    Rx / DC Orders ED Discharge Orders    None       Analese Sovine, Canary Brimhristopher J, MD 04/10/20 [phone]

## 2020-04-10 NOTE — Progress Notes (Signed)
PROGRESS NOTE    Dean Morris  HFW:[id number] DOB: 08-02-1956 DOA: 04/09/2020 PCP: Patient, No Pcp Per   Brief Narrative: 64 year old with past medical history significant for A. fib, heart disease a status post stent in place, hypertension, hyperlipidemia, gout, cholelithiasis who presents complaining of abdominal pain, lower quadrant, worse after he eats, he report weight loss, small amount of blood in the stool.  He does not have insurance, he does not have a PCP.  Assessment & Plan:   Active Problems:   Abdominal pain   1-abdominal pain lower quadrant, small amount of rectal bleeding, weight loss. The abdomen and pelvis negative. I have consulted GI unclear if patient will be able to follow-up with wine as an outpatient. Lipase normal.  2-history of CAD, status post a stent, reported chest pain. Troponin flat. EKG with mild changes Check echocardiogram. Could be related to hypertension  AKI CKD unclear if acute on chronic: Continue with IV fluids. Check UA. Repeat be met today. Creatinine peaked to 2.  Has decreased to 1.5.  Hypertensive urgency: Lisinopril due to AKI.  I have started Norvasc  Hyperlipidemia Check lipid panel  Gallstone: Need to follow-up with surgery  There is no height or weight on file to calculate BMI.   DVT prophylaxis: SCDs Code Status: Full code Family Communication: Care discussed with patient Disposition Plan:  Status is: Observation  The patient remains OBS appropriate and will d/c before 2 midnights.  Dispo: The patient is from: Home              Anticipated d/c is to: Home              Anticipated d/c date is: 2 days              Patient currently is not medically stable to d/c.        Consultants:   GI  Procedures:   Echo  Antimicrobials:    Subjective: He reports blood in the stool. He report weight loss, lower quadrant abdominal pain.  He reports abdominal pain gets worse after he  eats.  Objective: Vitals:   04/10/20 1231 04/10/20 1330 04/10/20 1415 04/10/20 1430  BP: (!) 132/74 (!) 153/80 (!) 145/83 (!) 142/77  Pulse: 80 73 68 70  Resp: '18 12 15 16  ' Temp:      SpO2: 100% 100% 97% 98%    Intake/Output Summary (Last 24 hours) at 04/10/2020 1517 Last data filed at 04/10/2020 0256 Gross per 24 hour  Intake 500 ml  Output --  Net 500 ml   There were no vitals filed for this visit.  Examination:  General exam: Appears calm and comfortable  Respiratory system: Clear to auscultation. Respiratory effort normal. Cardiovascular system: S1 & S2 heard, RRR. No JVD, murmurs, rubs, gallops or clicks. No pedal edema. Gastrointestinal system: Abdomen is nondistended, soft and nontender. No organomegaly or masses felt. Normal bowel sounds heard. Central nervous system: Alert and oriented. No focal neurological deficits. Extremities: Symmetric 5 x 5 power. Skin: No rashes, lesions or ulcers Psychiatry: Judgement and insight appear normal. Mood & affect appropriate.     Data Reviewed: I have personally reviewed following labs and imaging studies  CBC: Recent Labs  Lab 04/09/20 1348 04/10/20 0822  WBC 9.4 6.6  HGB 14.8 13.7  HCT 45.3 43.6  MCV 91.9 92.6  PLT 275 027   Basic Metabolic Panel: Recent Labs  Lab 04/09/20 1348 04/10/20 0822 04/10/20 1130  NA 143 142  --  K 3.4* 4.0  --   CL 109 109  --   CO2 22 24  --   GLUCOSE 101* 94  --   BUN 33* 26*  --   CREATININE 2.06* 1.58* 1.51*  CALCIUM 9.2 8.7*  --    GFR: CrCl cannot be calculated (Unknown ideal weight.). Liver Function Tests: Recent Labs  Lab 04/09/20 1348 04/10/20 0822  AST 47* 31  ALT 48* 40  ALKPHOS 75 60  BILITOT 0.8 0.9  PROT 6.5 5.8*  ALBUMIN 4.0 3.5   Recent Labs  Lab 04/09/20 1348 04/10/20 1130  LIPASE 27 29   No results for input(s): AMMONIA in the last 168 hours. Coagulation Profile: Recent Labs  Lab 04/10/20 0036  INR 1.0   Cardiac Enzymes: No results for  input(s): CKTOTAL, CKMB, CKMBINDEX, TROPONINI in the last 168 hours. BNP (last 3 results) No results for input(s): PROBNP in the last 8760 hours. HbA1C: No results for input(s): HGBA1C in the last 72 hours. CBG: No results for input(s): GLUCAP in the last 168 hours. Lipid Profile: No results for input(s): CHOL, HDL, LDLCALC, TRIG, CHOLHDL, LDLDIRECT in the last 72 hours. Thyroid Function Tests: Recent Labs    04/10/20 1130  TSH 1.235   Anemia Panel: No results for input(s): VITAMINB12, FOLATE, FERRITIN, TIBC, IRON, RETICCTPCT in the last 72 hours. Sepsis Labs: Recent Labs  Lab 04/10/20 0036  LATICACIDVEN 1.3    Recent Results (from the past 240 hour(s))  SARS Coronavirus 2 by RT PCR (hospital order, performed in [facility] hospital lab) Nasopharyngeal Nasopharyngeal Swab     Status: None   Collection Time: 04/10/20  5:09 AM   Specimen: Nasopharyngeal Swab  Result Value Ref Range Status   SARS Coronavirus 2 NEGATIVE NEGATIVE Final    Comment: (NOTE) SARS-CoV-2 target nucleic acids are NOT DETECTED.  The SARS-CoV-2 RNA is generally detectable in upper and lower respiratory specimens during the acute phase of infection. The lowest concentration of SARS-CoV-2 viral copies this assay can detect is 250 copies / mL. A negative result does not preclude SARS-CoV-2 infection and should not be used as the sole basis for treatment or other patient management decisions.  A negative result may occur with improper specimen collection / handling, submission of specimen other than nasopharyngeal swab, presence of viral mutation(s) within the areas targeted by this assay, and inadequate number of viral copies (<250 copies / mL). A negative result must be combined with clinical observations, patient history, and epidemiological information.  Fact Sheet for Patients:   StrictlyIdeas.no  Fact Sheet for Healthcare  Providers: BankingDealers.co.za  This test is not yet approved or  cleared by the [place] FDA and has been authorized for detection and/or diagnosis of SARS-CoV-2 by FDA under an Emergency Use Authorization (EUA).  This EUA will remain in effect (meaning this test can be used) for the duration of the COVID-19 declaration under Section 564(b)(1) of the Act, 21 U.S.C. section 360bbb-3(b)(1), unless the authorization is terminated or revoked sooner.  Performed at [facility], [place] [address], [place], [place] [address]          Radiology Studies: CT ABDOMEN PELVIS WO CONTRAST  Result Date: 04/10/2020 CLINICAL DATA:  Abdominal pain EXAM: CT ABDOMEN AND PELVIS WITHOUT CONTRAST TECHNIQUE: Multidetector CT imaging of the abdomen and pelvis was performed following the standard protocol without IV contrast. COMPARISON:  None. FINDINGS: Lower chest: The visualized heart size within normal limits. No pericardial fluid/thickening. No hiatal hernia. The visualized  portions of the lungs are clear. Hepatobiliary: Although limited due to the lack of intravenous contrast, normal in appearance without gross focal abnormality. Layering calcified gallstones are present. Pancreas:  Unremarkable.  No surrounding inflammatory changes. Spleen: Normal in size. Although limited due to the lack of intravenous contrast, normal in appearance. Adrenals/Urinary Tract: Both adrenal glands appear normal. A low-density 3 cm lesion seen in the upper pole the left kidney. No renal or collecting system calculi are noted. The bladder is unremarkable. Stomach/Bowel: The stomach, small bowel, and colon are normal in appearance. No inflammatory changes or obstructive findings. appendix is normal. Vascular/Lymphatic: There are no enlarged abdominal or pelvic lymph nodes. Scattered dense aortic atherosclerosis is seen. Reproductive: The prostate is unremarkable. Other: There is a small right-sided fat  containing inguinal hernia present. A small anterior umbilical hernia containing fat is noted. Musculoskeletal: No acute or significant osseous findings. IMPRESSION: No acute intra-abdominal or pelvic pathology to explain the patient's symptoms. Cholelithiasis Small fat containing anterior abdominal wall hernia and right inguinal hernia. Aortic Atherosclerosis (ICD10-I70.0). Electronically Signed   By: Prudencio Pair M.D.   On: 04/10/2020 02:28   DG Chest Port 1 View  Result Date: 04/10/2020 CLINICAL DATA:  Chest pain EXAM: PORTABLE CHEST 1 VIEW COMPARISON:  09/11/2016 FINDINGS: The heart size and mediastinal contours are within normal limits. Both lungs are clear. The visualized skeletal structures are unremarkable. IMPRESSION: No active disease. Electronically Signed   By: Inez Catalina M.D.   On: 04/10/2020 00:59   ECHOCARDIOGRAM COMPLETE  Result Date: 04/10/2020    ECHOCARDIOGRAM REPORT   Patient Name:   ELERY CADENHEAD Date of Exam: 04/10/2020 Medical Rec #:  [id number]    Height:       65.0 in Accession #:    [id number]   Weight:       230.0 lb Date of Birth:  10-28-1955    BSA:          2.099 m Patient Age:    39 years     BP:           159/87 mmHg Patient Gender: M            HR:           65 bpm. Exam Location:  Inpatient Procedure: 2D Echo, Cardiac Doppler and Color Doppler Indications:    Elevated troponin.  History:        Patient has no prior history of Echocardiogram examinations.                 CAD, Arrythmias:Atrial Fibrillation, Signs/Symptoms:Chest Pain;                 Risk Factors:Hypertension and Dyslipidemia. Elevated troponin.  Sonographer:    Roseanna Rainbow RDCS Referring Phys: [id number] [facility] A [employer] Agrusa  Sonographer Comments: Technically difficult study due to poor echo windows. IMPRESSIONS  1. Left ventricular ejection fraction, by estimation, is 60 to 65%. The left ventricle has normal function. The left ventricle has no regional wall motion abnormalities. There is severe left ventricular  hypertrophy. Left ventricular diastolic parameters  are indeterminate.  2. Right ventricular systolic function is normal. The right ventricular size is normal. There is normal pulmonary artery systolic pressure.  3. The mitral valve is normal in structure. No evidence of mitral valve regurgitation. No evidence of mitral stenosis.  4. The aortic valve is tricuspid. Aortic valve regurgitation is not visualized. No aortic stenosis is present.  5. Aortic dilatation noted. There is mild dilatation  of the ascending aorta measuring 40 mm.  6. The inferior vena cava is normal in size with greater than 50% respiratory variability, suggesting right atrial pressure of 3 mmHg. FINDINGS  Left Ventricle: Left ventricular ejection fraction, by estimation, is 60 to 65%. The left ventricle has normal function. The left ventricle has no regional wall motion abnormalities. The left ventricular internal cavity size was normal in size. There is  severe left ventricular hypertrophy. Left ventricular diastolic parameters are indeterminate. Right Ventricle: The right ventricular size is normal. No increase in right ventricular wall thickness. Right ventricular systolic function is normal. There is normal pulmonary artery systolic pressure. The tricuspid regurgitant velocity is 2.05 m/s, and  with an assumed right atrial pressure of 3 mmHg, the estimated right ventricular systolic pressure is 46.2 mmHg. Left Atrium: Left atrial size was normal in size. Right Atrium: Right atrial size was normal in size. Pericardium: There is no evidence of pericardial effusion. Mitral Valve: The mitral valve is normal in structure. No evidence of mitral valve regurgitation. No evidence of mitral valve stenosis. Tricuspid Valve: The tricuspid valve is normal in structure. Tricuspid valve regurgitation is trivial. No evidence of tricuspid stenosis. Aortic Valve: The aortic valve is tricuspid. . There is mild thickening and mild calcification of the aortic  valve. Aortic valve regurgitation is not visualized. No aortic stenosis is present. Mild aortic valve annular calcification. There is mild thickening of the aortic valve. There is mild calcification of the aortic valve. Aortic valve mean gradient measures 3.5 mmHg. Aortic valve peak gradient measures 7.1 mmHg. Aortic valve area, by VTI measures 2.47 cm. Pulmonic Valve: The pulmonic valve was not well visualized. Pulmonic valve regurgitation is not visualized. No evidence of pulmonic stenosis. Aorta: Aortic dilatation noted and the aortic root is normal in size and structure. 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 Pulmonary Artery: Indeterminate PASP, inadequate TR jet. Venous: The inferior vena cava is normal in size with greater than 50% respiratory variability, suggesting right atrial pressure of 3 mmHg. IAS/Shunts: No atrial level shunt detected by color flow Doppler.  LEFT VENTRICLE PLAX 2D LVIDd:         3.90 cm     Diastology LVIDs:         2.50 cm     LV e' lateral:   8.16 cm/s LV PW:         1.60 cm     LV E/e' lateral: 9.1 LV IVS:        1.70 cm     LV e' medial:    7.72 cm/s LVOT diam:     2.10 cm     LV E/e' medial:  9.6 LV SV:         64 LV SV Index:   31 LVOT Area:     3.46 cm  LV Volumes (MOD) LV vol d, MOD A2C: 57.2 ml LV vol d, MOD A4C: 59.7 ml LV vol s, MOD A2C: 17.2 ml LV vol s, MOD A4C: 26.7 ml LV SV MOD A2C:     40.0 ml LV SV MOD A4C:     59.7 ml LV SV MOD BP:      40.2 ml RIGHT VENTRICLE             IVC RV S prime:     11.50 cm/s  IVC diam: 2.00 cm TAPSE (M-mode): 2.4 cm LEFT ATRIUM             Index  RIGHT ATRIUM           Index LA diam:        2.90 cm 1.38 cm/m  RA Area:     13.50 cm LA Vol (A2C):   23.9 ml 11.39 ml/m RA Volume:   29.60 ml  14.10 ml/m LA Vol (A4C):   23.3 ml 11.10 ml/m LA Biplane Vol: 23.8 ml 11.34 ml/m  AORTIC VALVE AV Area (Vmax):    2.83 cm AV Area (Vmean):   2.88 cm AV Area (VTI):     2.47 cm AV Vmax:           133.57 cm/s AV  Vmean:          86.247 cm/s AV VTI:            0.261 m AV Peak Grad:      7.1 mmHg AV Mean Grad:      3.5 mmHg LVOT Vmax:         109.00 cm/s LVOT Vmean:        71.600 cm/s LVOT VTI:          0.186 m LVOT/AV VTI ratio: 0.71  AORTA Ao Root diam: 3.60 cm MITRAL VALVE               TRICUSPID VALVE MV Area (PHT): 2.84 cm    TR Peak grad:   16.8 mmHg MV Decel Time: 268 msec    TR Vmax:        205.00 cm/s MV E velocity: 74.10 cm/s MV A velocity: 60.00 cm/s  SHUNTS MV E/A ratio:  1.24        Systemic VTI:  0.19 m                            Systemic Diam: 2.10 cm Carlyle Dolly MD Electronically signed by Carlyle Dolly MD Signature Date/Time: 04/10/2020/11:23:04 AM    Final    US ABDOMEN LIMITED RUQ  Result Date: 04/10/2020 CLINICAL DATA:  Follow-up cholelithiasis and abdominal pain EXAM: ULTRASOUND ABDOMEN LIMITED RIGHT UPPER QUADRANT COMPARISON:  CT from earlier in the same day. FINDINGS: Gallbladder: Gallbladder is well distended with multiple gallstones within. No wall thickening or pericholecystic fluid is noted. Negative sonographic Murphy's sign is elicited. Common bile duct: Diameter: 5.2 mm. Liver: Diffusely increased in echogenicity consistent with fatty infiltration. Portal vein is patent on color Doppler imaging with normal direction of blood flow towards the liver. Other: None. IMPRESSION: Cholelithiasis without complicating factors. Fatty infiltration of the liver. Electronically Signed   By: Inez Catalina M.D.   On: 04/10/2020 03:59        Scheduled Meds: . amLODipine  5 mg Oral Daily  . enoxaparin (LOVENOX) injection  40 mg Subcutaneous Q24H  . pantoprazole (PROTONIX) IV  40 mg Intravenous Q12H  . sodium chloride flush  3 mL Intravenous Q12H   Continuous Infusions: . sodium chloride 100 mL/hr at 04/10/20 0835     LOS: 0 days    Time spent: 35 minutes.     Elmarie Shiley, MD [facility]   If 7PM-7AM, please contact night-coverage [email or web address]  04/10/2020, 3:17 PM

## 2020-04-10 NOTE — Consult Note (Signed)
Reason for Consult: Abdominal pain bright red blood per rectum Referring Physician: Hospital team  Dean Morris is an 64 y.o. male.  HPI: Patient seen and examined and case discussed with the hospital team and hospital computer chart reviewed and he says he has been trying to lose weight and has lost 30 pounds and he does not work because of his back and he has known he has  gallstones for 10 years and has vague changing abdominal pains which come and go and he says he is ready to have his gallbladder out and his bowels are fairly regular for him and occasionally will see some bright red blood and he says a lot of things run in the family and has never had a GI work-up and he has no other complaints and he is eating fine without problems and has no other complaints  Past Medical History:  Diagnosis Date  . CAD (coronary artery disease)    stent  . Cholelithiasis   . Gout   . Hyperlipidemia   . Hypertension   . Paroxysmal A-fib [facility])     Past Surgical History:  Procedure Laterality Date  . BLADDER REPAIR     unknown procedure  . CORONARY ANGIOPLASTY WITH STENT PLACEMENT      Family History  Problem Relation Age of Onset  . Heart failure Mother   . Heart failure Brother     Social History:  reports that he has never smoked. He has never used smokeless tobacco. He reports that he does not drink alcohol and does not use drugs.  Allergies: No Known Allergies  Medications: I have reviewed the patient's current medications.  Results for orders placed or performed during the hospital encounter of 04/09/20 (from the past 48 hour(s))  Lipase, blood     Status: None   Collection Time: 04/09/20  1:48 PM  Result Value Ref Range   Lipase 27 11 - 51 U/L    Comment: Performed at [facility], [address], [place], [place] [address]  Comprehensive metabolic panel     Status: Abnormal   Collection Time: 04/09/20  1:48 PM  Result Value Ref Range   Sodium 143 135 - 145 mmol/L    Potassium 3.4 (L) 3.5 - 5.1 mmol/L   Chloride 109 98 - 111 mmol/L   CO2 22 22 - 32 mmol/L   Glucose, Bld 101 (H) 70 - 99 mg/dL    Comment: Glucose reference range applies only to samples taken after fasting for at least 8 hours.   BUN 33 (H) 8 - 23 mg/dL   Creatinine, Ser 0.17 (H) 0.61 - 1.24 mg/dL   Calcium 9.2 8.9 - 49.4 mg/dL   Total Protein 6.5 6.5 - 8.1 g/dL   Albumin 4.0 3.5 - 5.0 g/dL   AST 47 (H) 15 - 41 U/L   ALT 48 (H) 0 - 44 U/L   Alkaline Phosphatase 75 38 - 126 U/L   Total Bilirubin 0.8 0.3 - 1.2 mg/dL   GFR calc non Af Amer 33 (L) >60 mL/min   GFR calc Af Amer 38 (L) >60 mL/min   Anion gap 12 5 - 15    Comment: Performed at [facility], [address] [place], [place] [address]  CBC     Status: None   Collection Time: 04/09/20  1:48 PM  Result Value Ref Range   WBC 9.4 4.0 - 10.5 K/uL   RBC 4.93 4.22 - 5.81 MIL/uL  Hemoglobin 14.8 13.0 - 17.0 g/dL   HCT 40.9 39 - 52 %   MCV 91.9 80.0 - 100.0 fL   MCH 30.0 26.0 - 34.0 pg   MCHC 32.7 30.0 - 36.0 g/dL   RDW 81.1 91.4 - 78.2 %   Platelets 275 150 - 400 K/uL   nRBC 0.0 0.0 - 0.2 %    Comment: Performed at [facility], [address], [place], [place] [address]  Urinalysis, Routine w reflex microscopic     Status: None   Collection Time: 04/09/20  8:27 PM  Result Value Ref Range   Color, Urine YELLOW YELLOW   APPearance CLEAR CLEAR   Specific Gravity, Urine 1.026 1.005 - 1.030   pH 5.0 5.0 - 8.0   Glucose, UA NEGATIVE NEGATIVE mg/dL   Hgb urine dipstick NEGATIVE NEGATIVE   Bilirubin Urine NEGATIVE NEGATIVE   Ketones, ur NEGATIVE NEGATIVE mg/dL   Protein, ur NEGATIVE NEGATIVE mg/dL   Nitrite NEGATIVE NEGATIVE   Leukocytes,Ua NEGATIVE NEGATIVE    Comment: Performed at [facility], [address], [place], [place] [address]  Lactic acid, plasma     Status: None   Collection Time: 04/10/20 12:36 AM  Result Value Ref Range   Lactic Acid, Venous 1.3 0.5 - 1.9 mmol/L    Comment: Performed at  [facility], [address] [place], [place] [address]  Troponin I (High Sensitivity)     Status: Abnormal   Collection Time: 04/10/20 12:36 AM  Result Value Ref Range   Troponin I (High Sensitivity) 33 (H) <18 ng/L    Comment: (NOTE) Elevated high sensitivity troponin I (hsTnI) values and significant  changes across serial measurements may suggest ACS but many other  chronic and acute conditions are known to elevate hsTnI results.  Refer to the "Links" section for chest pain algorithms and additional  guidance. Performed at [facility], [address] [place], [place] [address]   Protime-INR     Status: None   Collection Time: 04/10/20 12:36 AM  Result Value Ref Range   Prothrombin Time 12.7 11.4 - 15.2 seconds   INR 1.0 0.8 - 1.2    Comment: (NOTE) INR goal varies based on device and disease states. Performed at [facility], [address] [place], [place] [address]   Type and screen     Status: None   Collection Time: 04/10/20 12:38 AM  Result Value Ref Range   ABO/RH(D) O POS    Antibody Screen NEG    Sample Expiration      04/13/2020,2359 Performed at [facility], [address] [place], [place] [address]   Troponin I (High Sensitivity)     Status: Abnormal   Collection Time: 04/10/20  2:55 AM  Result Value Ref Range   Troponin I (High Sensitivity) 27 (H) <18 ng/L    Comment: (NOTE) Elevated high sensitivity troponin I (hsTnI) values and significant  changes across serial measurements may suggest ACS but many other  chronic and acute conditions are known to elevate hsTnI results.  Refer to the "Links" section for chest pain algorithms and additional  guidance. Performed at [facility], [address], [place], [place] [address]   SARS Coronavirus 2 by RT PCR (hospital order, performed in [facility] hospital lab) Nasopharyngeal Nasopharyngeal Swab     Status: None   Collection Time: 04/10/20  5:09 AM   Specimen:  Nasopharyngeal Swab  Result Value  Ref Range   SARS Coronavirus 2 NEGATIVE NEGATIVE    Comment: (NOTE) SARS-CoV-2 target nucleic acids are NOT DETECTED.  The SARS-CoV-2 RNA is generally detectable in upper and lower respiratory specimens during the acute phase of infection. The lowest concentration of SARS-CoV-2 viral copies this assay can detect is 250 copies / mL. A negative result does not preclude SARS-CoV-2 infection and should not be used as the sole basis for treatment or other patient management decisions.  A negative result may occur with improper specimen collection / handling, submission of specimen other than nasopharyngeal swab, presence of viral mutation(s) within the areas targeted by this assay, and inadequate number of viral copies (<250 copies / mL). A negative result must be combined with clinical observations, patient history, and epidemiological information.  Fact Sheet for Patients:   BoilerBrush.com.cy  Fact Sheet for Healthcare Providers: https://pope.com/  This test is not yet approved or  cleared by the [place] FDA and has been authorized for detection and/or diagnosis of SARS-CoV-2 by FDA under an Emergency Use Authorization (EUA).  This EUA will remain in effect (meaning this test can be used) for the duration of the COVID-19 declaration under Section 564(b)(1) of the Act, 21 U.S.C. section 360bbb-3(b)(1), unless the authorization is terminated or revoked sooner.  Performed at [facility], [address], [place], [place] [address]   ABO/Rh     Status: None   Collection Time: 04/10/20  8:22 AM  Result Value Ref Range   ABO/RH(D)      O POS Performed at [facility], [address] [place], [place] [address]   CBC     Status: None   Collection Time: 04/10/20  8:22 AM  Result Value Ref Range   WBC 6.6 4.0 - 10.5 K/uL   RBC 4.71 4.22 - 5.81 MIL/uL   Hemoglobin 13.7 13.0 - 17.0  g/dL   HCT 09.8 39 - 52 %   MCV 92.6 80.0 - 100.0 fL   MCH 29.1 26.0 - 34.0 pg   MCHC 31.4 30.0 - 36.0 g/dL   RDW 11.9 14.7 - 82.9 %   Platelets 206 150 - 400 K/uL   nRBC 0.0 0.0 - 0.2 %    Comment: Performed at [facility], [address], [place], [place] [address]  Basic metabolic panel     Status: Abnormal   Collection Time: 04/10/20  8:22 AM  Result Value Ref Range   Sodium 142 135 - 145 mmol/L   Potassium 4.0 3.5 - 5.1 mmol/L   Chloride 109 98 - 111 mmol/L   CO2 24 22 - 32 mmol/L   Glucose, Bld 94 70 - 99 mg/dL    Comment: Glucose reference range applies only to samples taken after fasting for at least 8 hours.   BUN 26 (H) 8 - 23 mg/dL   Creatinine, Ser 0.86 (H) 0.61 - 1.24 mg/dL   Calcium 8.7 (L) 8.9 - 10.3 mg/dL   GFR calc non Af Amer 45 (L) >60 mL/min   GFR calc Af Amer 52 (L) >60 mL/min   Anion gap 9 5 - 15    Comment: Performed at [facility], [address] [place], [place] [address]  Hepatic function panel     Status: Abnormal   Collection Time: 04/10/20  8:22 AM  Result Value Ref Range   Total Protein 5.8 (L) 6.5 - 8.1 g/dL   Albumin 3.5 3.5 - 5.0 g/dL   AST  31 15 - 41 U/L   ALT 40 0 - 44 U/L   Alkaline Phosphatase 60 38 - 126 U/L   Total Bilirubin 0.9 0.3 - 1.2 mg/dL   Bilirubin, Direct 0.1 0.0 - 0.2 mg/dL   Indirect Bilirubin 0.8 0.3 - 0.9 mg/dL    Comment: Performed at [facility], [address], [place], [place] [address]  HIV Antibody (routine testing w rflx)     Status: None   Collection Time: 04/10/20 11:30 AM  Result Value Ref Range   HIV Screen 4th Generation wRfx Non Reactive Non Reactive    Comment: Performed at [facility], [address], [place], [place] [address]  Creatinine, serum     Status: Abnormal   Collection Time: 04/10/20 11:30 AM  Result Value Ref Range   Creatinine, Ser 1.51 (H) 0.61 - 1.24 mg/dL   GFR calc non Af Amer 48 (L) >60 mL/min   GFR calc Af Amer 55 (L) >60 mL/min    Comment: Performed at  [facility], [address] [place], [place] [address]  TSH     Status: None   Collection Time: 04/10/20 11:30 AM  Result Value Ref Range   TSH 1.235 0.350 - 4.500 uIU/mL    Comment: Performed by a 3rd Generation assay with a functional sensitivity of <=0.01 uIU/mL. Performed at [facility], [address] [place], [place] [address]   Lipase, blood     Status: None   Collection Time: 04/10/20 11:30 AM  Result Value Ref Range   Lipase 29 11 - 51 U/L    Comment: Performed at [facility], [address], [place], [place] [address]    CT ABDOMEN PELVIS WO CONTRAST  Result Date: 04/10/2020 CLINICAL DATA:  Abdominal pain EXAM: CT ABDOMEN AND PELVIS WITHOUT CONTRAST TECHNIQUE: Multidetector CT imaging of the abdomen and pelvis was performed following the standard protocol without IV contrast. COMPARISON:  None. FINDINGS: Lower chest: The visualized heart size within normal limits. No pericardial fluid/thickening. No hiatal hernia. The visualized portions of the lungs are clear. Hepatobiliary: Although limited due to the lack of intravenous contrast, normal in appearance without gross focal abnormality. Layering calcified gallstones are present. Pancreas:  Unremarkable.  No surrounding inflammatory changes. Spleen: Normal in size. Although limited due to the lack of intravenous contrast, normal in appearance. Adrenals/Urinary Tract: Both adrenal glands appear normal. A low-density 3 cm lesion seen in the upper pole the left kidney. No renal or collecting system calculi are noted. The bladder is unremarkable. Stomach/Bowel: The stomach, small bowel, and colon are normal in appearance. No inflammatory changes or obstructive findings. appendix is normal. Vascular/Lymphatic: There are no enlarged abdominal or pelvic lymph nodes. Scattered dense aortic atherosclerosis is seen. Reproductive: The prostate is unremarkable. Other: There is a small right-sided fat containing inguinal hernia  present. A small anterior umbilical hernia containing fat is noted. Musculoskeletal: No acute or significant osseous findings. IMPRESSION: No acute intra-abdominal or pelvic pathology to explain the patient's symptoms. Cholelithiasis Small fat containing anterior abdominal wall hernia and right inguinal hernia. Aortic Atherosclerosis (ICD10-I70.0). Electronically Signed   By: Jonna Clark M.D.   On: 04/10/2020 02:28   DG Chest Port 1 View  Result Date: 04/10/2020 CLINICAL DATA:  Chest pain EXAM: PORTABLE CHEST 1 VIEW COMPARISON:  09/11/2016 FINDINGS: The heart size and mediastinal contours are within normal limits. Both lungs are clear. The visualized skeletal structures are unremarkable. IMPRESSION: No active disease.  Electronically Signed   By: Alcide Clever M.D.   On: 04/10/2020 00:59   ECHOCARDIOGRAM COMPLETE  Result Date: 04/10/2020    ECHOCARDIOGRAM REPORT   Patient Name:   Dean Morris Date of Exam: 04/10/2020 Medical Rec #:  [id number]    Height:       65.0 in Accession #:    [id number]   Weight:       230.0 lb Date of Birth:  07/24/1956    BSA:          2.099 m Patient Age:    64 years     BP:           159/87 mmHg Patient Gender: M            HR:           65 bpm. Exam Location:  Inpatient Procedure: 2D Echo, Cardiac Doppler and Color Doppler Indications:    Elevated troponin.  History:        Patient has no prior history of Echocardiogram examinations.                 CAD, Arrythmias:Atrial Fibrillation, Signs/Symptoms:Chest Pain;                 Risk Factors:Hypertension and Dyslipidemia. Elevated troponin.  Sonographer:    Sheralyn Boatman RDCS Referring Phys: [id number] [facility] A [place]  Sonographer Comments: Technically difficult study due to poor echo windows. IMPRESSIONS  1. Left ventricular ejection fraction, by estimation, is 60 to 65%. The left ventricle has normal function. The left ventricle has no regional wall motion abnormalities. There is severe left ventricular hypertrophy. Left ventricular  diastolic parameters  are indeterminate.  2. Right ventricular systolic function is normal. The right ventricular size is normal. There is normal pulmonary artery systolic pressure.  3. The mitral valve is normal in structure. No evidence of mitral valve regurgitation. No evidence of mitral stenosis.  4. The aortic valve is tricuspid. Aortic valve regurgitation is not visualized. No aortic stenosis is present.  5. Aortic dilatation noted. 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  6. The inferior vena cava is normal in size with greater than 50% respiratory variability, suggesting right atrial pressure of 3 mmHg. FINDINGS  Left Ventricle: Left ventricular ejection fraction, by estimation, is 60 to 65%. The left ventricle has normal function. The left ventricle has no regional wall motion abnormalities. The left ventricular internal cavity size was normal in size. There is  severe left ventricular hypertrophy. Left ventricular diastolic parameters are indeterminate. Right Ventricle: The right ventricular size is normal. No increase in right ventricular wall thickness. Right ventricular systolic function is normal. There is normal pulmonary artery systolic pressure. The tricuspid regurgitant velocity is 2.05 m/s, and  with an assumed right atrial pressure of 3 mmHg, the estimated right ventricular systolic pressure is 19.8 mmHg. Left Atrium: Left atrial size was normal in size. Right Atrium: Right atrial size was normal in size. Pericardium: There is no evidence of pericardial effusion. Mitral Valve: The mitral valve is normal in structure. No evidence of mitral valve regurgitation. No evidence of mitral valve stenosis. Tricuspid Valve: The tricuspid valve is normal in structure. Tricuspid valve regurgitation is trivial. No evidence of tricuspid stenosis. Aortic Valve: The aortic valve is tricuspid. . There is mild thickening and mild calcification of the aortic valve. Aortic valve regurgitation  is not visualized. No aortic stenosis is present. Mild aortic valve annular calcification. There is mild thickening  of the aortic valve. There is mild calcification of the aortic valve. Aortic valve mean gradient measures 3.5 mmHg. Aortic valve peak gradient measures 7.1 mmHg. Aortic valve area, by VTI measures 2.47 cm. Pulmonic Valve: The pulmonic valve was not well visualized. Pulmonic valve regurgitation is not visualized. No evidence of pulmonic stenosis. Aorta: Aortic dilatation noted and the aortic root is normal in size and structure. 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 Pulmonary Artery: Indeterminate PASP, inadequate TR jet. Venous: The inferior vena cava is normal in size with greater than 50% respiratory variability, suggesting right atrial pressure of 3 mmHg. IAS/Shunts: No atrial level shunt detected by color flow Doppler.  LEFT VENTRICLE PLAX 2D LVIDd:         3.90 cm     Diastology LVIDs:         2.50 cm     LV e' lateral:   8.16 cm/s LV PW:         1.60 cm     LV E/e' lateral: 9.1 LV IVS:        1.70 cm     LV e' medial:    7.72 cm/s LVOT diam:     2.10 cm     LV E/e' medial:  9.6 LV SV:         64 LV SV Index:   31 LVOT Area:     3.46 cm  LV Volumes (MOD) LV vol d, MOD A2C: 57.2 ml LV vol d, MOD A4C: 59.7 ml LV vol s, MOD A2C: 17.2 ml LV vol s, MOD A4C: 26.7 ml LV SV MOD A2C:     40.0 ml LV SV MOD A4C:     59.7 ml LV SV MOD BP:      40.2 ml RIGHT VENTRICLE             IVC RV S prime:     11.50 cm/s  IVC diam: 2.00 cm TAPSE (M-mode): 2.4 cm LEFT ATRIUM             Index       RIGHT ATRIUM           Index LA diam:        2.90 cm 1.38 cm/m  RA Area:     13.50 cm LA Vol (A2C):   23.9 ml 11.39 ml/m RA Volume:   29.60 ml  14.10 ml/m LA Vol (A4C):   23.3 ml 11.10 ml/m LA Biplane Vol: 23.8 ml 11.34 ml/m  AORTIC VALVE AV Area (Vmax):    2.83 cm AV Area (Vmean):   2.88 cm AV Area (VTI):     2.47 cm AV Vmax:           133.57 cm/s AV Vmean:          86.247 cm/s AV VTI:             0.261 m AV Peak Grad:      7.1 mmHg AV Mean Grad:      3.5 mmHg LVOT Vmax:         109.00 cm/s LVOT Vmean:        71.600 cm/s LVOT VTI:          0.186 m LVOT/AV VTI ratio: 0.71  AORTA Ao Root diam: 3.60 cm MITRAL VALVE               TRICUSPID VALVE MV Area (PHT): 2.84 cm    TR Peak grad:  16.8 mmHg MV Decel Time: 268 msec    TR Vmax:        205.00 cm/s MV E velocity: 74.10 cm/s MV A velocity: 60.00 cm/s  SHUNTS MV E/A ratio:  1.24        Systemic VTI:  0.19 m                            Systemic Diam: 2.10 cm Dina RichJonathan Branch MD Electronically signed by Dina RichJonathan Branch MD Signature Date/Time: 04/10/2020/11:23:04 AM    Final    US ABDOMEN LIMITED RUQ  Result Date: 04/10/2020 CLINICAL DATA:  Follow-up cholelithiasis and abdominal pain EXAM: ULTRASOUND ABDOMEN LIMITED RIGHT UPPER QUADRANT COMPARISON:  CT from earlier in the same day. FINDINGS: Gallbladder: Gallbladder is well distended with multiple gallstones within. No wall thickening or pericholecystic fluid is noted. Negative sonographic Murphy's sign is elicited. Common bile duct: Diameter: 5.2 mm. Liver: Diffusely increased in echogenicity consistent with fatty infiltration. Portal vein is patent on color Doppler imaging with normal direction of blood flow towards the liver. Other: None. IMPRESSION: Cholelithiasis without complicating factors. Fatty infiltration of the liver. Electronically Signed   By: Alcide CleverMark  Lukens M.D.   On: 04/10/2020 03:59    Review of Systems negative except above Blood pressure (!) 142/77, pulse 70, temperature 99 F (37.2 C), resp. rate 16, SpO2 98 %. Physical Exam vital signs stable afebrile no acute distress patient sitting comfortably in the bed abdomen is soft no guarding or rebound no obvious tenderness CT normal except gallstones liver tests lipase normal CBC normal  Assessment/Plan: Changing abdominal pain in patient with gallstones and some self-limited occasional bright red blood per rectum Plan: No obvious  reason for inpatient GI work-up happy to see back as outpatient to set up colonoscopy might consider surgical consult for cholecystectomy since it sounds like he is ready for that and please call my hospital partner this week if any further question or problem from a GI standpoint  Tyia Binford E 04/10/2020, 3:20 PM

## 2020-04-11 ENCOUNTER — Other Ambulatory Visit (HOSPITAL_COMMUNITY): Payer: Self-pay | Admitting: Internal Medicine

## 2020-04-11 DIAGNOSIS — I160 Hypertensive urgency: Secondary | ICD-10-CM | POA: Diagnosis not present

## 2020-04-11 LAB — URINE CULTURE: Culture: NO GROWTH

## 2020-04-11 LAB — CBC
HCT: 42.8 % (ref 39.0–52.0)
Hemoglobin: 13.8 g/dL (ref 13.0–17.0)
MCH: 29.1 pg (ref 26.0–34.0)
MCHC: 32.2 g/dL (ref 30.0–36.0)
MCV: 90.3 fL (ref 80.0–100.0)
Platelets: 219 10*3/uL (ref 150–400)
RBC: 4.74 MIL/uL (ref 4.22–5.81)
RDW: 12.6 % (ref 11.5–15.5)
WBC: 7.2 10*3/uL (ref 4.0–10.5)
nRBC: 0 % (ref 0.0–0.2)

## 2020-04-11 LAB — BASIC METABOLIC PANEL
Anion gap: 9 (ref 5–15)
BUN: 17 mg/dL (ref 8–23)
CO2: 23 mmol/L (ref 22–32)
Calcium: 9.3 mg/dL (ref 8.9–10.3)
Chloride: 109 mmol/L (ref 98–111)
Creatinine, Ser: 1.26 mg/dL — ABNORMAL HIGH (ref 0.61–1.24)
GFR calc Af Amer: >60 mL/min (ref >60)
GFR calc non Af Amer: 59 mL/min — ABNORMAL LOW (ref >60)
Glucose, Bld: 102 mg/dL — ABNORMAL HIGH (ref 70–99)
Potassium: 4.2 mmol/L (ref 3.5–5.1)
Sodium: 141 mmol/L (ref 135–145)

## 2020-04-11 LAB — GLUCOSE, CAPILLARY: Glucose-Capillary: 78 mg/dL (ref 70–99)

## 2020-04-11 MED ORDER — AMLODIPINE BESYLATE 10 MG PO TABS
10.0000 mg | ORAL_TABLET | Freq: Every day | ORAL | Status: DC
  Administered 2020-04-11: 10 mg via ORAL
  Filled 2020-04-11: qty 1

## 2020-04-11 MED ORDER — AMLODIPINE BESYLATE 10 MG PO TABS: 10.0000 mg | ORAL_TABLET | Freq: Every day | ORAL | 0 refills | Status: DC

## 2020-04-11 MED ORDER — BREO ELLIPTA 100-25 MCG/INH IN AEPB: 1.0000 | INHALATION_SPRAY | Freq: Every day | RESPIRATORY_TRACT | 0 refills | Status: DC

## 2020-04-11 MED ORDER — PANTOPRAZOLE SODIUM 40 MG PO TBEC
40.0000 mg | DELAYED_RELEASE_TABLET | Freq: Every day | ORAL | 1 refills | Status: DC
Start: 2020-04-11 — End: 2020-08-22

## 2020-04-11 MED FILL — BREO ELLIPTA 100-25 MCG INH: 100-25 | 30 days supply | Qty: 60 | Fill #0

## 2020-04-11 MED FILL — AMLODIPINE BESYLATE 10 MG T: 10 | 30 days supply | Qty: 30 | Fill #0

## 2020-04-11 MED FILL — PANTOPRAZOLE SOD DR 40 MG T: 40 | 30 days supply | Qty: 30 | Fill #0

## 2020-04-11 NOTE — Consult Note (Signed)
[facility] Surgery Consult Note  Cardell Rachel July 23, 1956  [id number].    Requesting MD: Sunnie Nielsen, MD Chief Complaint/Reason for Consult: cholelithiasis, abdominal pain  HPI:  Mr. Zabriskie is a 64 y/o M who presented to the hospital with CC 5-7 days of abdominal pain. Pain described as sharp, lower abdominal pain. Denies similar pain in the past. Associated sxs include nausea, emesis, and dry heaves which occurred about 3 days ago. Also endorses melena. States he was diagnosed with gallstones in [place] about 10 years ago, at that time he was told that he couldn't get a cholecystectomy because there were too many emergent OR cases ahead of him. Since that time he denies RUQ abdominal pain, post-prandial pain, or post-prandial nausea/emeses. He has chronic back pain. Denies history of colonoscopy or upper endoscopy. Reports recent intentional weight loss. States he is a former smoker. states he used to drink beer regularly but has not in about 2 years. Also states he used to smoke marijuana to help with his pain but no longer does. Is currently unemployed which he says is due to back pain caused by his kidneys. States he was living with some "kin folk" but was kicked out so now he is going to have to live in a shelter.   ROS: Review of Systems  All other systems reviewed and are negative.   Family History  Problem Relation Age of Onset  . Heart failure Mother   . Heart failure Brother     Past Medical History:  Diagnosis Date  . CAD (coronary artery disease)    stent  . Cholelithiasis   . Gout   . Hyperlipidemia   . Hypertension   . Paroxysmal A-fib [facility])     Past Surgical History:  Procedure Laterality Date  . BLADDER REPAIR     unknown procedure  . CORONARY ANGIOPLASTY WITH STENT PLACEMENT      Social History:  reports that he has never smoked. He has never used smokeless tobacco. He reports that he does not drink alcohol and does not use drugs.  Allergies:  No Known Allergies  Medications Prior to Admission  Medication Sig Dispense Refill  . aspirin EC 81 MG tablet Take 81 mg by mouth daily. Swallow whole.    . fluticasone furoate-vilanterol (BREO ELLIPTA) 100-25 MCG/INH AEPB Inhale 1 puff into the lungs daily.    Marland Kitchen ibuprofen (ADVIL) 200 MG tablet Take 200 mg by mouth every 6 (six) hours as needed for moderate pain.    . predniSONE (DELTASONE) 20 MG tablet 2 tabs po daily x 4 days (Patient not taking: Reported on 04/10/2020) 8 tablet 0    Blood pressure (!) 155/94, pulse 66, temperature 98.7 F (37.1 C), temperature source Oral, resp. rate 18, height 5\' 6"  (1.676 m), weight (!) 96.8 kg, SpO2 97 %. Physical Exam: Constitutional: NAD; conversant; no deformities Eyes: Moist conjunctiva; no lid lag; anicteric; PERRL Neck: Trachea midline; no thyromegaly Lungs: Normal respiratory effort; no tactile fremitus CV: RRR; no palpable thrills; no pitting edema GI: Abd soft, TTP epigastric region and RLQ without guarding or rebound tenderness; no palpable hepatosplenomegaly MSK: Normal gait; no clubbing/cyanosis Psychiatric: Appropriate affect; alert and oriented x3 Lymphatic: No palpable cervical or axillary lymphadenopathy  Results for orders placed or performed during the hospital encounter of 04/09/20 (from the past 48 hour(s))  Lipase, blood     Status: None   Collection Time: 04/09/20  1:48 PM  Result Value Ref Range   Lipase 27  11 - 51 U/L    Comment: Performed at [facility], [address] [place], [place] [address]  Comprehensive metabolic panel     Status: Abnormal   Collection Time: 04/09/20  1:48 PM  Result Value Ref Range   Sodium 143 135 - 145 mmol/L   Potassium 3.4 (L) 3.5 - 5.1 mmol/L   Chloride 109 98 - 111 mmol/L   CO2 22 22 - 32 mmol/L   Glucose, Bld 101 (H) 70 - 99 mg/dL    Comment: Glucose reference range applies only to samples taken after fasting for at least 8 hours.   BUN 33 (H) 8 - 23 mg/dL   Creatinine, Ser  8.112.06 (H) 0.61 - 1.24 mg/dL   Calcium 9.2 8.9 - 91.410.3 mg/dL   Total Protein 6.5 6.5 - 8.1 g/dL   Albumin 4.0 3.5 - 5.0 g/dL   AST 47 (H) 15 - 41 U/L   ALT 48 (H) 0 - 44 U/L   Alkaline Phosphatase 75 38 - 126 U/L   Total Bilirubin 0.8 0.3 - 1.2 mg/dL   GFR calc non Af Amer 33 (L) >60 mL/min   GFR calc Af Amer 38 (L) >60 mL/min   Anion gap 12 5 - 15    Comment: Performed at [facility], [address] [place], [place] [address]  CBC     Status: None   Collection Time: 04/09/20  1:48 PM  Result Value Ref Range   WBC 9.4 4.0 - 10.5 K/uL   RBC 4.93 4.22 - 5.81 MIL/uL   Hemoglobin 14.8 13.0 - 17.0 g/dL   HCT 62.145.3 39 - 52 %   MCV 91.9 80.0 - 100.0 fL   MCH 30.0 26.0 - 34.0 pg   MCHC 32.7 30.0 - 36.0 g/dL   RDW 30.812.8 65.711.5 - 84.615.5 %   Platelets 275 150 - 400 K/uL   nRBC 0.0 0.0 - 0.2 %    Comment: Performed at [facility], [address] [place], [place] [address]  Urinalysis, Routine w reflex microscopic     Status: None   Collection Time: 04/09/20  8:27 PM  Result Value Ref Range   Color, Urine YELLOW YELLOW   APPearance CLEAR CLEAR   Specific Gravity, Urine 1.026 1.005 - 1.030   pH 5.0 5.0 - 8.0   Glucose, UA NEGATIVE NEGATIVE mg/dL   Hgb urine dipstick NEGATIVE NEGATIVE   Bilirubin Urine NEGATIVE NEGATIVE   Ketones, ur NEGATIVE NEGATIVE mg/dL   Protein, ur NEGATIVE NEGATIVE mg/dL   Nitrite NEGATIVE NEGATIVE   Leukocytes,Ua NEGATIVE NEGATIVE    Comment: Performed at [facility], [address] [place], [place] [address]  Lactic acid, plasma     Status: None   Collection Time: 04/10/20 12:36 AM  Result Value Ref Range   Lactic Acid, Venous 1.3 0.5 - 1.9 mmol/L    Comment: Performed at [facility], [address] [place], [place] [address]  Troponin I (High Sensitivity)     Status: Abnormal   Collection Time: 04/10/20 12:36 AM  Result Value Ref Range   Troponin I (High Sensitivity) 33 (H) <18 ng/L    Comment: (NOTE) Elevated high sensitivity  troponin I (hsTnI) values and significant  changes across serial measurements may suggest ACS but many other  chronic and acute conditions are known to elevate hsTnI results.  Refer to the "Links" section for chest pain algorithms and additional  guidance. Performed at [facility], [address] [place], [place] [address]   Protime-INR     Status: None   Collection Time: 04/10/20 12:36 AM  Result Value Ref Range   Prothrombin Time 12.7 11.4 - 15.2 seconds   INR 1.0 0.8 - 1.2    Comment: (NOTE) INR goal varies based on device and disease states. Performed at [facility], [address], [place], [place] [address]   Type and screen     Status: None   Collection Time: 04/10/20 12:38 AM  Result Value Ref Range   ABO/RH(D) O POS    Antibody Screen NEG    Sample Expiration      04/13/2020,2359 Performed at [facility], [address], [place], [place] [address]   Troponin I (High Sensitivity)     Status: Abnormal   Collection Time: 04/10/20  2:55 AM  Result Value Ref Range   Troponin I (High Sensitivity) 27 (H) <18 ng/L    Comment: (NOTE) Elevated high sensitivity troponin I (hsTnI) values and significant  changes across serial measurements may suggest ACS but many other  chronic and acute conditions are known to elevate hsTnI results.  Refer to the "Links" section for chest pain algorithms and additional  guidance. Performed at [facility], [address], [place], [place] [address]   SARS Coronavirus 2 by RT PCR (hospital order, performed in [facility] hospital lab) Nasopharyngeal Nasopharyngeal Swab     Status: None   Collection Time: 04/10/20  5:09 AM   Specimen: Nasopharyngeal Swab  Result Value Ref Range   SARS Coronavirus 2 NEGATIVE NEGATIVE    Comment: (NOTE) SARS-CoV-2 target nucleic acids are NOT DETECTED.  The SARS-CoV-2 RNA is generally detectable in upper and lower respiratory specimens during the acute phase of infection. The  lowest concentration of SARS-CoV-2 viral copies this assay can detect is 250 copies / mL. A negative result does not preclude SARS-CoV-2 infection and should not be used as the sole basis for treatment or other patient management decisions.  A negative result may occur with improper specimen collection / handling, submission of specimen other than nasopharyngeal swab, presence of viral mutation(s) within the areas targeted by this assay, and inadequate number of viral copies (<250 copies / mL). A negative result must be combined with clinical observations, patient history, and epidemiological information.  Fact Sheet for Patients:   BoilerBrush.com.cy  Fact Sheet for Healthcare Providers: https://pope.com/  This test is not yet approved or  cleared by the [place] FDA and has been authorized for detection and/or diagnosis of SARS-CoV-2 by FDA under an Emergency Use Authorization (EUA).  This EUA will remain in effect (meaning this test can be used) for the duration of the COVID-19 declaration under Section 564(b)(1) of the Act, 21 U.S.C. section 360bbb-3(b)(1), unless the authorization is terminated or revoked sooner.  Performed at [facility], [address], [place], [place] [address]   ABO/Rh     Status: None   Collection Time: 04/10/20  8:22 AM  Result Value Ref Range   ABO/RH(D)      O POS Performed at [facility], [address], [place], [place] [address]   CBC     Status: None   Collection Time: 04/10/20  8:22 AM  Result Value Ref Range   WBC 6.6 4.0 - 10.5 K/uL   RBC 4.71 4.22 - 5.81 MIL/uL   Hemoglobin 13.7 13.0 - 17.0 g/dL   HCT  43.6 39 - 52 %   MCV 92.6 80.0 - 100.0 fL   MCH 29.1 26.0 - 34.0 pg   MCHC 31.4 30.0 - 36.0 g/dL   RDW 01.0 27.2 - 53.6 %   Platelets 206 150 - 400 K/uL   nRBC 0.0 0.0 - 0.2 %    Comment: Performed at [facility], [address], [place], [place] [address]   Basic metabolic panel     Status: Abnormal   Collection Time: 04/10/20  8:22 AM  Result Value Ref Range   Sodium 142 135 - 145 mmol/L   Potassium 4.0 3.5 - 5.1 mmol/L   Chloride 109 98 - 111 mmol/L   CO2 24 22 - 32 mmol/L   Glucose, Bld 94 70 - 99 mg/dL    Comment: Glucose reference range applies only to samples taken after fasting for at least 8 hours.   BUN 26 (H) 8 - 23 mg/dL   Creatinine, Ser 4.74 (H) 0.61 - 1.24 mg/dL   Calcium 8.7 (L) 8.9 - 10.3 mg/dL   GFR calc non Af Amer 45 (L) >60 mL/min   GFR calc Af Amer 52 (L) >60 mL/min   Anion gap 9 5 - 15    Comment: Performed at [facility], [address] [place], [place] [address]  Hepatic function panel     Status: Abnormal   Collection Time: 04/10/20  8:22 AM  Result Value Ref Range   Total Protein 5.8 (L) 6.5 - 8.1 g/dL   Albumin 3.5 3.5 - 5.0 g/dL   AST 31 15 - 41 U/L   ALT 40 0 - 44 U/L   Alkaline Phosphatase 60 38 - 126 U/L   Total Bilirubin 0.9 0.3 - 1.2 mg/dL   Bilirubin, Direct 0.1 0.0 - 0.2 mg/dL   Indirect Bilirubin 0.8 0.3 - 0.9 mg/dL    Comment: Performed at [facility], [address], [place], [place] [address]  Urine Culture     Status: None   Collection Time: 04/10/20  8:43 AM   Specimen: Urine, Random  Result Value Ref Range   Specimen Description URINE, RANDOM    Special Requests NONE    Culture      NO GROWTH Performed at [facility], [address], [place], [place] [address]    Report Status 04/11/2020 FINAL   HIV Antibody (routine testing w rflx)     Status: None   Collection Time: 04/10/20 11:30 AM  Result Value Ref Range   HIV Screen 4th Generation wRfx Non Reactive Non Reactive    Comment: Performed at [facility], [address] [place], [place] [address]  Creatinine, serum     Status: Abnormal   Collection Time: 04/10/20 11:30 AM  Result Value Ref Range   Creatinine, Ser 1.51 (H) 0.61 - 1.24 mg/dL   GFR calc non Af Amer 48 (L) >60 mL/min   GFR calc Af Amer  55 (L) >60 mL/min    Comment: Performed at [facility], [address], [place], [place] [address]  TSH     Status: None   Collection Time: 04/10/20 11:30 AM  Result Value Ref Range   TSH 1.235 0.350 - 4.500 uIU/mL    Comment: Performed by a 3rd Generation assay with a functional sensitivity of <=0.01 uIU/mL. Performed at [facility], [address], [place], [place] [address]   Lipase, blood     Status: None  Collection Time: 04/10/20 11:30 AM  Result Value Ref Range   Lipase 29 11 - 51 U/L    Comment: Performed at [facility], [address], [place], [place] [address]  Glucose, capillary     Status: None   Collection Time: 04/11/20  4:29 AM  Result Value Ref Range   Glucose-Capillary 78 70 - 99 mg/dL    Comment: Glucose reference range applies only to samples taken after fasting for at least 8 hours.   CT ABDOMEN PELVIS WO CONTRAST  Result Date: 04/10/2020 CLINICAL DATA:  Abdominal pain EXAM: CT ABDOMEN AND PELVIS WITHOUT CONTRAST TECHNIQUE: Multidetector CT imaging of the abdomen and pelvis was performed following the standard protocol without IV contrast. COMPARISON:  None. FINDINGS: Lower chest: The visualized heart size within normal limits. No pericardial fluid/thickening. No hiatal hernia. The visualized portions of the lungs are clear. Hepatobiliary: Although limited due to the lack of intravenous contrast, normal in appearance without gross focal abnormality. Layering calcified gallstones are present. Pancreas:  Unremarkable.  No surrounding inflammatory changes. Spleen: Normal in size. Although limited due to the lack of intravenous contrast, normal in appearance. Adrenals/Urinary Tract: Both adrenal glands appear normal. A low-density 3 cm lesion seen in the upper pole the left kidney. No renal or collecting system calculi are noted. The bladder is unremarkable. Stomach/Bowel: The stomach, small bowel, and colon are normal in appearance. No inflammatory changes  or obstructive findings. appendix is normal. Vascular/Lymphatic: There are no enlarged abdominal or pelvic lymph nodes. Scattered dense aortic atherosclerosis is seen. Reproductive: The prostate is unremarkable. Other: There is a small right-sided fat containing inguinal hernia present. A small anterior umbilical hernia containing fat is noted. Musculoskeletal: No acute or significant osseous findings. IMPRESSION: No acute intra-abdominal or pelvic pathology to explain the patient's symptoms. Cholelithiasis Small fat containing anterior abdominal wall hernia and right inguinal hernia. Aortic Atherosclerosis (ICD10-I70.0). Electronically Signed   By: Jonna Clark M.D.   On: 04/10/2020 02:28   DG Chest Port 1 View  Result Date: 04/10/2020 CLINICAL DATA:  Chest pain EXAM: PORTABLE CHEST 1 VIEW COMPARISON:  09/11/2016 FINDINGS: The heart size and mediastinal contours are within normal limits. Both lungs are clear. The visualized skeletal structures are unremarkable. IMPRESSION: No active disease. Electronically Signed   By: Alcide Clever M.D.   On: 04/10/2020 00:59   ECHOCARDIOGRAM COMPLETE  Result Date: 04/10/2020    ECHOCARDIOGRAM REPORT   Patient Name:   Dean Morris Date of Exam: 04/10/2020 Medical Rec #:  [id number]    Height:       65.0 in Accession #:    [id number]   Weight:       230.0 lb Date of Birth:  Feb 06, 1956    BSA:          2.099 m Patient Age:    64 years     BP:           159/87 mmHg Patient Gender: M            HR:           65 bpm. Exam Location:  Inpatient Procedure: 2D Echo, Cardiac Doppler and Color Doppler Indications:    Elevated troponin.  History:        Patient has no prior history of Echocardiogram examinations.                 CAD, Arrythmias:Atrial Fibrillation, Signs/Symptoms:Chest Pain;  Risk Factors:Hypertension and Dyslipidemia. Elevated troponin.  Sonographer:    Sheralyn Boatman RDCS Referring Phys: [id number] [facility] A [place]  Sonographer Comments: Technically difficult study  due to poor echo windows. IMPRESSIONS  1. Left ventricular ejection fraction, by estimation, is 60 to 65%. The left ventricle has normal function. The left ventricle has no regional wall motion abnormalities. There is severe left ventricular hypertrophy. Left ventricular diastolic parameters  are indeterminate.  2. Right ventricular systolic function is normal. The right ventricular size is normal. There is normal pulmonary artery systolic pressure.  3. The mitral valve is normal in structure. No evidence of mitral valve regurgitation. No evidence of mitral stenosis.  4. The aortic valve is tricuspid. Aortic valve regurgitation is not visualized. No aortic stenosis is present.  5. Aortic dilatation noted. 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  6. The inferior vena cava is normal in size with greater than 50% respiratory variability, suggesting right atrial pressure of 3 mmHg. FINDINGS  Left Ventricle: Left ventricular ejection fraction, by estimation, is 60 to 65%. The left ventricle has normal function. The left ventricle has no regional wall motion abnormalities. The left ventricular internal cavity size was normal in size. There is  severe left ventricular hypertrophy. Left ventricular diastolic parameters are indeterminate. Right Ventricle: The right ventricular size is normal. No increase in right ventricular wall thickness. Right ventricular systolic function is normal. There is normal pulmonary artery systolic pressure. The tricuspid regurgitant velocity is 2.05 m/s, and  with an assumed right atrial pressure of 3 mmHg, the estimated right ventricular systolic pressure is 19.8 mmHg. Left Atrium: Left atrial size was normal in size. Right Atrium: Right atrial size was normal in size. Pericardium: There is no evidence of pericardial effusion. Mitral Valve: The mitral valve is normal in structure. No evidence of mitral valve regurgitation. No evidence of mitral valve stenosis.  Tricuspid Valve: The tricuspid valve is normal in structure. Tricuspid valve regurgitation is trivial. No evidence of tricuspid stenosis. Aortic Valve: The aortic valve is tricuspid. . There is mild thickening and mild calcification of the aortic valve. Aortic valve regurgitation is not visualized. No aortic stenosis is present. Mild aortic valve annular calcification. There is mild thickening of the aortic valve. There is mild calcification of the aortic valve. Aortic valve mean gradient measures 3.5 mmHg. Aortic valve peak gradient measures 7.1 mmHg. Aortic valve area, by VTI measures 2.47 cm. Pulmonic Valve: The pulmonic valve was not well visualized. Pulmonic valve regurgitation is not visualized. No evidence of pulmonic stenosis. Aorta: Aortic dilatation noted and the aortic root is normal in size and structure. 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 Pulmonary Artery: Indeterminate PASP, inadequate TR jet. Venous: The inferior vena cava is normal in size with greater than 50% respiratory variability, suggesting right atrial pressure of 3 mmHg. IAS/Shunts: No atrial level shunt detected by color flow Doppler.  LEFT VENTRICLE PLAX 2D LVIDd:         3.90 cm     Diastology LVIDs:         2.50 cm     LV e' lateral:   8.16 cm/s LV PW:         1.60 cm     LV E/e' lateral: 9.1 LV IVS:        1.70 cm     LV e' medial:    7.72 cm/s LVOT diam:     2.10 cm  LV E/e' medial:  9.6 LV SV:         64 LV SV Index:   31 LVOT Area:     3.46 cm  LV Volumes (MOD) LV vol d, MOD A2C: 57.2 ml LV vol d, MOD A4C: 59.7 ml LV vol s, MOD A2C: 17.2 ml LV vol s, MOD A4C: 26.7 ml LV SV MOD A2C:     40.0 ml LV SV MOD A4C:     59.7 ml LV SV MOD BP:      40.2 ml RIGHT VENTRICLE             IVC RV S prime:     11.50 cm/s  IVC diam: 2.00 cm TAPSE (M-mode): 2.4 cm LEFT ATRIUM             Index       RIGHT ATRIUM           Index LA diam:        2.90 cm 1.38 cm/m  RA Area:     13.50 cm LA Vol (A2C):   23.9 ml 11.39  ml/m RA Volume:   29.60 ml  14.10 ml/m LA Vol (A4C):   23.3 ml 11.10 ml/m LA Biplane Vol: 23.8 ml 11.34 ml/m  AORTIC VALVE AV Area (Vmax):    2.83 cm AV Area (Vmean):   2.88 cm AV Area (VTI):     2.47 cm AV Vmax:           133.57 cm/s AV Vmean:          86.247 cm/s AV VTI:            0.261 m AV Peak Grad:      7.1 mmHg AV Mean Grad:      3.5 mmHg LVOT Vmax:         109.00 cm/s LVOT Vmean:        71.600 cm/s LVOT VTI:          0.186 m LVOT/AV VTI ratio: 0.71  AORTA Ao Root diam: 3.60 cm MITRAL VALVE               TRICUSPID VALVE MV Area (PHT): 2.84 cm    TR Peak grad:   16.8 mmHg MV Decel Time: 268 msec    TR Vmax:        205.00 cm/s MV E velocity: 74.10 cm/s MV A velocity: 60.00 cm/s  SHUNTS MV E/A ratio:  1.24        Systemic VTI:  0.19 m                            Systemic Diam: 2.10 cm Dina Rich MD Electronically signed by Dina Rich MD Signature Date/Time: 04/10/2020/11:23:04 AM    Final    US ABDOMEN LIMITED RUQ  Result Date: 04/10/2020 CLINICAL DATA:  Follow-up cholelithiasis and abdominal pain EXAM: ULTRASOUND ABDOMEN LIMITED RIGHT UPPER QUADRANT COMPARISON:  CT from earlier in the same day. FINDINGS: Gallbladder: Gallbladder is well distended with multiple gallstones within. No wall thickening or pericholecystic fluid is noted. Negative sonographic Murphy's sign is elicited. Common bile duct: Diameter: 5.2 mm. Liver: Diffusely increased in echogenicity consistent with fatty infiltration. Portal vein is patent on color Doppler imaging with normal direction of blood flow towards the liver. Other: None. IMPRESSION: Cholelithiasis without complicating factors. Fatty infiltration of the liver. Electronically Signed   By: Alcide Clever M.D.   On: 04/10/2020 03:59  Assessment/Plan Paroxysmal a.fib CAD s/p stent -echo this admission w/ EF 60-65% AKI  HTN HLD Lower abdominal pain Melena   Cholelithiasis without evidence of cholecystitis  This patient has cholelithiasis without  evidence of significant biliary colic or cholecystitis. I agree with GI that he would benefit from an outpatient GI workup including upper and lower endoscopy. At this time I am not convinced that removal of his gallbladder would improve his abdominal sxs. I will arrange follow up in our [facility] office to discuss elective cholecystectomy.  Adam Phenix, PA-C [facility] Please see Amion for pager number during day hours 7:00am-4:30pm 04/11/2020, 9:05 AM

## 2020-04-11 NOTE — Discharge Summary (Signed)
Physician Discharge Summary  Dean Morris YNW:[id number] DOB: July 24, 1956 DOA: 04/09/2020  PCP: Patient, No Pcp Per  Admit date: 04/09/2020 Discharge date: 04/11/2020  Admitted From: Home  Disposition: Home   Recommendations for Outpatient Follow-up:  1. Follow up with PCP in 1-2 weeks 2. Please obtain BMP/CBC in one week 3. Needs to follow-up with Dr. Andrey Campanile, general surgery for further discussion for elective cholecystectomy 4. Need to follow-up with Dr. Glade Nurse for further evaluation of hematochezia and lower quadrant abdominal pain   Discharge Condition: Stable.  CODE STATUS: Full code Diet recommendation: Heart Healthy  Brief/Interim Summary: 64 year old with past medical history significant for A. fib, heart disease a status post stent in place, hypertension, hyperlipidemia, gout, cholelithiasis who presents complaining of abdominal pain, lower quadrant, worse after he eats, he report weight loss, small amount of blood in the stool.  He does not have insurance, he does not have a PCP.  1-Abdominal pain lower quadrant, small amount of rectal bleeding, weight loss. The abdomen and pelvis negative. I have consulted GI unclear if patient will be able to follow-up with wine as an outpatient. Lipase normal. Dr Ewing Schlein is recommending out patient evaluation.   2-History of CAD, status post a stent, reported chest pain. Troponin flat. EKG with mild changes ECHO normal  Could be related to hypertension  AKI CKD unclear if acute on chronic: Continue with IV fluids Resolved/  Creatinine peaked to 2.  Has decreased to 1.5.--1.3  Hypertensive urgency: Lisinopril due to AKI.  I have started Norvasc Improved.   Hyperlipidemia Needs  lipid panel outpatient.   Fatty liver; advised about weight loss and low fat diet.   Gallstone: Need to follow-up with surgery for elective cholecystectomy.   Discharge Diagnoses:  Active Problems:   Abdominal pain    Discharge  Instructions  Discharge Instructions    Diet - low sodium heart healthy   Complete by: As directed    Increase activity slowly   Complete by: As directed      Allergies as of 04/11/2020   No Known Allergies     Medication List    STOP taking these medications   ibuprofen 200 MG tablet Commonly known as: ADVIL   predniSONE 20 MG tablet Commonly known as: DELTASONE     TAKE these medications   amLODipine 10 MG tablet Commonly known as: NORVASC Take 1 tablet (10 mg total) by mouth daily. Start taking on: April 12, 2020   aspirin EC 81 MG tablet Take 81 mg by mouth daily. Swallow whole.   Breo Ellipta 100-25 MCG/INH Aepb Generic drug: fluticasone furoate-vilanterol Inhale 1 puff into the lungs daily. What changed: when to take this   pantoprazole 40 MG tablet Commonly known as: Protonix Take 1 tablet (40 mg total) by mouth daily.       Follow-up Information    Gaynelle Adu, MD. Go to.   Specialty: General Surgery Why: Please call to confirm your appointment time. We are working hard to make this appointment for you.  Contact information: [address] [place] [address] [phone]              No Known Allergies  Consultations:  GI  Surgery    Procedures/Studies: CT ABDOMEN PELVIS WO CONTRAST  Result Date: 04/10/2020 CLINICAL DATA:  Abdominal pain EXAM: CT ABDOMEN AND PELVIS WITHOUT CONTRAST TECHNIQUE: Multidetector CT imaging of the abdomen and pelvis was performed following the standard protocol without IV contrast. COMPARISON:  None. FINDINGS: Lower  chest: The visualized heart size within normal limits. No pericardial fluid/thickening. No hiatal hernia. The visualized portions of the lungs are clear. Hepatobiliary: Although limited due to the lack of intravenous contrast, normal in appearance without gross focal abnormality. Layering calcified gallstones are present. Pancreas:  Unremarkable.  No surrounding inflammatory changes.  Spleen: Normal in size. Although limited due to the lack of intravenous contrast, normal in appearance. Adrenals/Urinary Tract: Both adrenal glands appear normal. A low-density 3 cm lesion seen in the upper pole the left kidney. No renal or collecting system calculi are noted. The bladder is unremarkable. Stomach/Bowel: The stomach, small bowel, and colon are normal in appearance. No inflammatory changes or obstructive findings. appendix is normal. Vascular/Lymphatic: There are no enlarged abdominal or pelvic lymph nodes. Scattered dense aortic atherosclerosis is seen. Reproductive: The prostate is unremarkable. Other: There is a small right-sided fat containing inguinal hernia present. A small anterior umbilical hernia containing fat is noted. Musculoskeletal: No acute or significant osseous findings. IMPRESSION: No acute intra-abdominal or pelvic pathology to explain the patient's symptoms. Cholelithiasis Small fat containing anterior abdominal wall hernia and right inguinal hernia. Aortic Atherosclerosis (ICD10-I70.0). Electronically Signed   By: Jonna Clark M.D.   On: 04/10/2020 02:28   DG Chest Port 1 View  Result Date: 04/10/2020 CLINICAL DATA:  Chest pain EXAM: PORTABLE CHEST 1 VIEW COMPARISON:  09/11/2016 FINDINGS: The heart size and mediastinal contours are within normal limits. Both lungs are clear. The visualized skeletal structures are unremarkable. IMPRESSION: No active disease. Electronically Signed   By: Alcide Clever M.D.   On: 04/10/2020 00:59   ECHOCARDIOGRAM COMPLETE  Result Date: 04/10/2020    ECHOCARDIOGRAM REPORT   Patient Name:   Dean Morris Date of Exam: 04/10/2020 Medical Rec #:  [id number]    Height:       65.0 in Accession #:    [id number]   Weight:       230.0 lb Date of Birth:  06-03-1956    BSA:          2.099 m Patient Age:    64 years     BP:           159/87 mmHg Patient Gender: M            HR:           65 bpm. Exam Location:  Inpatient Procedure: 2D Echo, Cardiac Doppler and  Color Doppler Indications:    Elevated troponin.  History:        Patient has no prior history of Echocardiogram examinations.                 CAD, Arrythmias:Atrial Fibrillation, Signs/Symptoms:Chest Pain;                 Risk Factors:Hypertension and Dyslipidemia. Elevated troponin.  Sonographer:    Sheralyn Boatman RDCS Referring Phys: [id number] [facility] A Thy Gullikson  Sonographer Comments: Technically difficult study due to poor echo windows. IMPRESSIONS  1. Left ventricular ejection fraction, by estimation, is 60 to 65%. The left ventricle has normal function. The left ventricle has no regional wall motion abnormalities. There is severe left ventricular hypertrophy. Left ventricular diastolic parameters  are indeterminate.  2. Right ventricular systolic function is normal. The right ventricular size is normal. There is normal pulmonary artery systolic pressure.  3. The mitral valve is normal in structure. No evidence of mitral valve regurgitation. No evidence of mitral stenosis.  4. The aortic valve is tricuspid. Aortic valve regurgitation is  not visualized. No aortic stenosis is present.  5. Aortic dilatation noted. 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  6. The inferior vena cava is normal in size with greater than 50% respiratory variability, suggesting right atrial pressure of 3 mmHg. FINDINGS  Left Ventricle: Left ventricular ejection fraction, by estimation, is 60 to 65%. The left ventricle has normal function. The left ventricle has no regional wall motion abnormalities. The left ventricular internal cavity size was normal in size. There is  severe left ventricular hypertrophy. Left ventricular diastolic parameters are indeterminate. Right Ventricle: The right ventricular size is normal. No increase in right ventricular wall thickness. Right ventricular systolic function is normal. There is normal pulmonary artery systolic pressure. The tricuspid regurgitant velocity is 2.05 m/s, and  with an  assumed right atrial pressure of 3 mmHg, the estimated right ventricular systolic pressure is 19.8 mmHg. Left Atrium: Left atrial size was normal in size. Right Atrium: Right atrial size was normal in size. Pericardium: There is no evidence of pericardial effusion. Mitral Valve: The mitral valve is normal in structure. No evidence of mitral valve regurgitation. No evidence of mitral valve stenosis. Tricuspid Valve: The tricuspid valve is normal in structure. Tricuspid valve regurgitation is trivial. No evidence of tricuspid stenosis. Aortic Valve: The aortic valve is tricuspid. . There is mild thickening and mild calcification of the aortic valve. Aortic valve regurgitation is not visualized. No aortic stenosis is present. Mild aortic valve annular calcification. There is mild thickening of the aortic valve. There is mild calcification of the aortic valve. Aortic valve mean gradient measures 3.5 mmHg. Aortic valve peak gradient measures 7.1 mmHg. Aortic valve area, by VTI measures 2.47 cm. Pulmonic Valve: The pulmonic valve was not well visualized. Pulmonic valve regurgitation is not visualized. No evidence of pulmonic stenosis. Aorta: Aortic dilatation noted and the aortic root is normal in size and structure. 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 Pulmonary Artery: Indeterminate PASP, inadequate TR jet. Venous: The inferior vena cava is normal in size with greater than 50% respiratory variability, suggesting right atrial pressure of 3 mmHg. IAS/Shunts: No atrial level shunt detected by color flow Doppler.  LEFT VENTRICLE PLAX 2D LVIDd:         3.90 cm     Diastology LVIDs:         2.50 cm     LV e' lateral:   8.16 cm/s LV PW:         1.60 cm     LV E/e' lateral: 9.1 LV IVS:        1.70 cm     LV e' medial:    7.72 cm/s LVOT diam:     2.10 cm     LV E/e' medial:  9.6 LV SV:         64 LV SV Index:   31 LVOT Area:     3.46 cm  LV Volumes (MOD) LV vol d, MOD A2C: 57.2 ml LV vol d, MOD A4C:  59.7 ml LV vol s, MOD A2C: 17.2 ml LV vol s, MOD A4C: 26.7 ml LV SV MOD A2C:     40.0 ml LV SV MOD A4C:     59.7 ml LV SV MOD BP:      40.2 ml RIGHT VENTRICLE             IVC RV S prime:     11.50 cm/s  IVC diam: 2.00 cm TAPSE (M-mode):  2.4 cm LEFT ATRIUM             Index       RIGHT ATRIUM           Index LA diam:        2.90 cm 1.38 cm/m  RA Area:     13.50 cm LA Vol (A2C):   23.9 ml 11.39 ml/m RA Volume:   29.60 ml  14.10 ml/m LA Vol (A4C):   23.3 ml 11.10 ml/m LA Biplane Vol: 23.8 ml 11.34 ml/m  AORTIC VALVE AV Area (Vmax):    2.83 cm AV Area (Vmean):   2.88 cm AV Area (VTI):     2.47 cm AV Vmax:           133.57 cm/s AV Vmean:          86.247 cm/s AV VTI:            0.261 m AV Peak Grad:      7.1 mmHg AV Mean Grad:      3.5 mmHg LVOT Vmax:         109.00 cm/s LVOT Vmean:        71.600 cm/s LVOT VTI:          0.186 m LVOT/AV VTI ratio: 0.71  AORTA Ao Root diam: 3.60 cm MITRAL VALVE               TRICUSPID VALVE MV Area (PHT): 2.84 cm    TR Peak grad:   16.8 mmHg MV Decel Time: 268 msec    TR Vmax:        205.00 cm/s MV E velocity: 74.10 cm/s MV A velocity: 60.00 cm/s  SHUNTS MV E/A ratio:  1.24        Systemic VTI:  0.19 m                            Systemic Diam: 2.10 cm Dina Rich MD Electronically signed by Dina Rich MD Signature Date/Time: 04/10/2020/11:23:04 AM    Final    US ABDOMEN LIMITED RUQ  Result Date: 04/10/2020 CLINICAL DATA:  Follow-up cholelithiasis and abdominal pain EXAM: ULTRASOUND ABDOMEN LIMITED RIGHT UPPER QUADRANT COMPARISON:  CT from earlier in the same day. FINDINGS: Gallbladder: Gallbladder is well distended with multiple gallstones within. No wall thickening or pericholecystic fluid is noted. Negative sonographic Murphy's sign is elicited. Common bile duct: Diameter: 5.2 mm. Liver: Diffusely increased in echogenicity consistent with fatty infiltration. Portal vein is patent on color Doppler imaging with normal direction of blood flow towards the liver. Other:  None. IMPRESSION: Cholelithiasis without complicating factors. Fatty infiltration of the liver. Electronically Signed   By: Alcide Clever M.D.   On: 04/10/2020 03:59      Subjective: He report lower quadrant abdominal pain. Tolerating diet.   Discharge Exam: Vitals:   04/11/20 0808 04/11/20 1101  BP: (!) 155/94 (!) 153/105  Pulse: 66 79  Resp: 18 18  Temp: 98.7 F (37.1 C) 98.6 F (37 C)  SpO2: 97% 100%     General: Pt is alert, awake, not in acute distress Cardiovascular: RRR, S1/S2 +, no rubs, no gallops Respiratory: CTA bilaterally, no wheezing, no rhonchi Abdominal: Soft, NT, ND, bowel sounds + Extremities: no edema, no cyanosis    The results of significant diagnostics from this hospitalization (including imaging, microbiology, ancillary and laboratory) are listed below for reference.     Microbiology: Recent Results (from the past  240 hour(s))  SARS Coronavirus 2 by RT PCR (hospital order, performed in [facility] hospital lab) Nasopharyngeal Nasopharyngeal Swab     Status: None   Collection Time: 04/10/20  5:09 AM   Specimen: Nasopharyngeal Swab  Result Value Ref Range Status   SARS Coronavirus 2 NEGATIVE NEGATIVE Final    Comment: (NOTE) SARS-CoV-2 target nucleic acids are NOT DETECTED.  The SARS-CoV-2 RNA is generally detectable in upper and lower respiratory specimens during the acute phase of infection. The lowest concentration of SARS-CoV-2 viral copies this assay can detect is 250 copies / mL. A negative result does not preclude SARS-CoV-2 infection and should not be used as the sole basis for treatment or other patient management decisions.  A negative result may occur with improper specimen collection / handling, submission of specimen other than nasopharyngeal swab, presence of viral mutation(s) within the areas targeted by this assay, and inadequate number of viral copies (<250 copies / mL). A negative result must be combined with  clinical observations, patient history, and epidemiological information.  Fact Sheet for Patients:   BoilerBrush.com.cy  Fact Sheet for Healthcare Providers: https://pope.com/  This test is not yet approved or  cleared by the [place] FDA and has been authorized for detection and/or diagnosis of SARS-CoV-2 by FDA under an Emergency Use Authorization (EUA).  This EUA will remain in effect (meaning this test can be used) for the duration of the COVID-19 declaration under Section 564(b)(1) of the Act, 21 U.S.C. section 360bbb-3(b)(1), unless the authorization is terminated or revoked sooner.  Performed at [facility], [address], [place], [place] [address]   Urine Culture     Status: None   Collection Time: 04/10/20  8:43 AM   Specimen: Urine, Random  Result Value Ref Range Status   Specimen Description URINE, RANDOM  Final   Special Requests NONE  Final   Culture   Final    NO GROWTH Performed at [facility], [address] [place], [place] [address]    Report Status 04/11/2020 FINAL  Final     Labs: BNP (last 3 results) No results for input(s): BNP in the last 8760 hours. Basic Metabolic Panel: Recent Labs  Lab 04/09/20 1348 04/10/20 0822 04/10/20 1130 04/11/20 0908  NA 143 142  --  141  K 3.4* 4.0  --  4.2  CL 109 109  --  109  CO2 22 24  --  23  GLUCOSE 101* 94  --  102*  BUN 33* 26*  --  17  CREATININE 2.06* 1.58* 1.51* 1.26*  CALCIUM 9.2 8.7*  --  9.3   Liver Function Tests: Recent Labs  Lab 04/09/20 1348 04/10/20 0822  AST 47* 31  ALT 48* 40  ALKPHOS 75 60  BILITOT 0.8 0.9  PROT 6.5 5.8*  ALBUMIN 4.0 3.5   Recent Labs  Lab 04/09/20 1348 04/10/20 1130  LIPASE 27 29   No results for input(s): AMMONIA in the last 168 hours. CBC: Recent Labs  Lab 04/09/20 1348 04/10/20 0822 04/11/20 0908  WBC 9.4 6.6 7.2  HGB 14.8 13.7 13.8  HCT 45.3 43.6 42.8  MCV 91.9 92.6 90.3   PLT 275 206 219   Cardiac Enzymes: No results for input(s): CKTOTAL, CKMB, CKMBINDEX, TROPONINI in the last 168 hours. BNP: Invalid input(s): POCBNP CBG: Recent Labs  Lab 04/11/20 0429  GLUCAP 78   D-Dimer No results for input(s): DDIMER in the last 72 hours. Hgb A1c No results  for input(s): HGBA1C in the last 72 hours. Lipid Profile No results for input(s): CHOL, HDL, LDLCALC, TRIG, CHOLHDL, LDLDIRECT in the last 72 hours. Thyroid function studies Recent Labs    04/10/20 1130  TSH 1.235   Anemia work up No results for input(s): VITAMINB12, FOLATE, FERRITIN, TIBC, IRON, RETICCTPCT in the last 72 hours. Urinalysis    Component Value Date/Time   COLORURINE YELLOW 04/09/2020 2027   APPEARANCEUR CLEAR 04/09/2020 2027   LABSPEC 1.026 04/09/2020 2027   PHURINE 5.0 04/09/2020 2027   GLUCOSEU NEGATIVE 04/09/2020 2027   HGBUR NEGATIVE 04/09/2020 2027   BILIRUBINUR NEGATIVE 04/09/2020 2027   KETONESUR NEGATIVE 04/09/2020 2027   PROTEINUR NEGATIVE 04/09/2020 2027   NITRITE NEGATIVE 04/09/2020 2027   LEUKOCYTESUR NEGATIVE 04/09/2020 2027   Sepsis Labs Invalid input(s): PROCALCITONIN,  WBC,  LACTICIDVEN Microbiology Recent Results (from the past 240 hour(s))  SARS Coronavirus 2 by RT PCR (hospital order, performed in [facility] hospital lab) Nasopharyngeal Nasopharyngeal Swab     Status: None   Collection Time: 04/10/20  5:09 AM   Specimen: Nasopharyngeal Swab  Result Value Ref Range Status   SARS Coronavirus 2 NEGATIVE NEGATIVE Final    Comment: (NOTE) SARS-CoV-2 target nucleic acids are NOT DETECTED.  The SARS-CoV-2 RNA is generally detectable in upper and lower respiratory specimens during the acute phase of infection. The lowest concentration of SARS-CoV-2 viral copies this assay can detect is 250 copies / mL. A negative result does not preclude SARS-CoV-2 infection and should not be used as the sole basis for treatment or other patient management decisions.  A  negative result may occur with improper specimen collection / handling, submission of specimen other than nasopharyngeal swab, presence of viral mutation(s) within the areas targeted by this assay, and inadequate number of viral copies (<250 copies / mL). A negative result must be combined with clinical observations, patient history, and epidemiological information.  Fact Sheet for Patients:   BoilerBrush.com.cy  Fact Sheet for Healthcare Providers: https://pope.com/  This test is not yet approved or  cleared by the [place] FDA and has been authorized for detection and/or diagnosis of SARS-CoV-2 by FDA under an Emergency Use Authorization (EUA).  This EUA will remain in effect (meaning this test can be used) for the duration of the COVID-19 declaration under Section 564(b)(1) of the Act, 21 U.S.C. section 360bbb-3(b)(1), unless the authorization is terminated or revoked sooner.  Performed at [facility], [address] [place], [place] [address]   Urine Culture     Status: None   Collection Time: 04/10/20  8:43 AM   Specimen: Urine, Random  Result Value Ref Range Status   Specimen Description URINE, RANDOM  Final   Special Requests NONE  Final   Culture   Final    NO GROWTH Performed at [facility], [address], [place], [place] [address]    Report Status 04/11/2020 FINAL  Final     Time coordinating discharge: 40 minutes  SIGNED:   Alba Cory, MD  [facility]

## 2020-04-11 NOTE — Care Management Obs Status (Signed)
MEDICARE OBSERVATION STATUS NOTIFICATION   Patient Details  Name: Quenten Nawaz MRN: [id number] Date of Birth: 20-Mar-1956   Medicare Observation Status Notification Given:  Yes    Leone Haven, RN 04/11/2020, 2:37 PM

## 2020-04-11 NOTE — TOC Transition Note (Signed)
Transition of Care [facility]) - CM/SW Discharge Note   Patient Details  Name: Dean Morris MRN: [id number] Date of Birth: Sep 04, 1956  [facility]) CM/SW Contact:  Leone Haven, RN Phone Number: 04/11/2020, 3:06 PM   Clinical Narrative:    Patient is for dc today, NCM spoke with patient at bedside, he states he is homeless , NCM gave him a shelter/housing list of resouces.  TOC is filling his medications, he states he has medicaid and he has some money to pay for his medications.  He states he will need transportation ast, NCM asked what address is he going to , he states to the [facility] [address] [place], [place] [address] [phone].  Patient signed waiver for transportation.    Final next level of care: Homeless Shelter Barriers to Discharge: No Barriers Identified   Patient Goals and CMS Choice     Choice offered to / list presented to : NA  Discharge Placement                       Discharge Plan and Services                  DME Agency: NA       HH Arranged: NA          Social Determinants of Health (SDOH) Interventions     Readmission Risk Interventions No flowsheet data found.

## 2020-04-29 ENCOUNTER — Ambulatory Visit: Payer: Medicare Other | Admitting: Nurse Practitioner

## 2020-06-17 MED FILL — PANTOPRAZOLE SOD DR 40 MG T: 40 | 30 days supply | Qty: 30 | Fill #0

## 2020-06-22 ENCOUNTER — Ambulatory Visit (INDEPENDENT_AMBULATORY_CARE_PROVIDER_SITE_OTHER): Payer: Medicare Other | Admitting: Family Medicine

## 2020-06-22 ENCOUNTER — Other Ambulatory Visit: Payer: Self-pay

## 2020-06-22 ENCOUNTER — Encounter: Payer: Self-pay | Admitting: Family Medicine

## 2020-06-22 VITALS — BP 126/80 | HR 134 | Ht 66.0 in | Wt 223.4 lb

## 2020-06-22 DIAGNOSIS — K8020 Calculus of gallbladder without cholecystitis without obstruction: Secondary | ICD-10-CM | POA: Diagnosis not present

## 2020-06-22 DIAGNOSIS — R7309 Other abnormal glucose: Secondary | ICD-10-CM

## 2020-06-22 DIAGNOSIS — R1084 Generalized abdominal pain: Secondary | ICD-10-CM

## 2020-06-22 DIAGNOSIS — Z8719 Personal history of other diseases of the digestive system: Secondary | ICD-10-CM

## 2020-06-22 DIAGNOSIS — I10 Essential (primary) hypertension: Secondary | ICD-10-CM

## 2020-06-22 DIAGNOSIS — I480 Paroxysmal atrial fibrillation: Secondary | ICD-10-CM | POA: Diagnosis not present

## 2020-06-22 DIAGNOSIS — Z7689 Persons encountering health services in other specified circumstances: Secondary | ICD-10-CM

## 2020-06-22 LAB — POCT GLYCOSYLATED HEMOGLOBIN (HGB A1C): Hemoglobin A1C: 5.6 % (ref 4.0–5.6)

## 2020-06-22 NOTE — Patient Instructions (Addendum)
It was good to see you today.  Thank you for coming in.  I am putting in a referal for you to see GI for a colonoscopy.  They should reach out to you.  If you do not hear back from them in the next 2 weeks, please give [place] a call at [phone].   I am also getting blood tests today to test your blood, kidneys, thyroid and blood sugar and I will call you with the results.  I would like top see you again in 6 weeks.  Be Well, Dr Pecola Leisure

## 2020-06-23 LAB — CBC
Hematocrit: 44.5 % (ref 37.5–51.0)
Hemoglobin: 14.7 g/dL (ref 13.0–17.7)
MCH: 30.1 pg (ref 26.6–33.0)
MCHC: 33 g/dL (ref 31.5–35.7)
MCV: 91 fL (ref 79–97)
Platelets: 301 10*3/uL (ref 150–450)
RBC: 4.88 x10E6/uL (ref 4.14–5.80)
RDW: 12.7 % (ref 11.6–15.4)
WBC: 10.5 10*3/uL (ref 3.4–10.8)

## 2020-06-23 LAB — LIPID PANEL
Chol/HDL Ratio: 9.6 ratio — ABNORMAL HIGH (ref 0.0–5.0)
Cholesterol, Total: 250 mg/dL — ABNORMAL HIGH (ref 100–199)
HDL: 26 mg/dL — ABNORMAL LOW (ref >39)
LDL Chol Calc (NIH): 119 mg/dL — ABNORMAL HIGH (ref 0–99)
Triglycerides: 587 mg/dL (ref 0–149)
VLDL Cholesterol Cal: 105 mg/dL — ABNORMAL HIGH (ref 5–40)

## 2020-06-23 LAB — BASIC METABOLIC PANEL
BUN/Creatinine Ratio: 17 (ref 10–24)
BUN: 19 mg/dL (ref 8–27)
CO2: 21 mmol/L (ref 20–29)
Calcium: 9.4 mg/dL (ref 8.6–10.2)
Chloride: 104 mmol/L (ref 96–106)
Creatinine, Ser: 1.11 mg/dL (ref 0.76–1.27)
GFR calc Af Amer: 80 mL/min/{1.73_m2} (ref >59)
GFR calc non Af Amer: 69 mL/min/{1.73_m2} (ref >59)
Glucose: 88 mg/dL (ref 65–99)
Potassium: 4.6 mmol/L (ref 3.5–5.2)
Sodium: 142 mmol/L (ref 134–144)

## 2020-06-23 LAB — TSH: TSH: 2.06 u[IU]/mL (ref 0.450–4.500)

## 2020-06-26 ENCOUNTER — Encounter: Payer: Self-pay | Admitting: Family Medicine

## 2020-06-26 DIAGNOSIS — I10 Essential (primary) hypertension: Secondary | ICD-10-CM | POA: Insufficient documentation

## 2020-06-26 DIAGNOSIS — I2510 Atherosclerotic heart disease of native coronary artery without angina pectoris: Secondary | ICD-10-CM | POA: Insufficient documentation

## 2020-06-26 DIAGNOSIS — I480 Paroxysmal atrial fibrillation: Secondary | ICD-10-CM | POA: Insufficient documentation

## 2020-06-26 DIAGNOSIS — K8020 Calculus of gallbladder without cholecystitis without obstruction: Secondary | ICD-10-CM | POA: Insufficient documentation

## 2020-06-26 NOTE — Assessment & Plan Note (Signed)
Seen on abdominal CT in hospital in August.  No chnages since hospitalization. concerning findings for Cholecystitis. - Will likely refer to surgery at next visit for possible cholecystectomy at next visit

## 2020-06-28 NOTE — Progress Notes (Signed)
    SUBJECTIVE:   CHIEF COMPLAINT / HPI: Establish care, Chest/Abdominal Pain  Patient indicates he has had chest and abdominal pain for several months now.    Denies any n/v/d or constipation.  Denies difficulty eating or swallowing  Has not had repeat episodes of bleed since leaving hospital.  Indicates pain does not change with position or exertion.  Is exacerbated by foods such as sausage biscuit and drinking with coffee.    Patient seen in August hospital for BRBPR and abdominal/chest pain. Was seen by GI in hospital as well as surgery and recommended to f/u with both outpatient as well as establish care with new PCP.  Indicates was supposed to go to [facility] for colonoscopy but never heard back from them.  Wishes to see different GI than [facility].  Has not had furether episodes since seeing.  PERTINENT  PMH / PSH: Paroxysmal A-Fib, CAD s/p Stent placement, HTN, Cholelithiasis   OBJECTIVE:   BP 126/80   Pulse (!) 134   Ht 5\' 6"  (1.676 m)   Wt 223 lb 6.4 oz (101.3 kg)   SpO2 97%   BMI 36.06 kg/m    Physical Exam Constitutional:      General: He is not in acute distress.    Appearance: Normal appearance. He is not ill-appearing.  Cardiovascular:     Rate and Rhythm: Normal rate. Rhythm irregular.  Pulmonary:     Effort: Pulmonary effort is normal.     Breath sounds: Normal breath sounds.  Abdominal:     General: Abdomen is flat.     Palpations: Abdomen is soft.     Tenderness: There is abdominal tenderness.     Hernia: A hernia is present.     Comments: RUQ tenderness, Murphy's sign negative  Skin:    General: Skin is warm.     Capillary Refill: Capillary refill takes less than 2 seconds.  Neurological:     Mental Status: He is alert.  Psychiatric:        Mood and Affect: Mood normal.        Behavior: Behavior normal.   ASSESSMENT/PLAN:   Cholelithiasis Seen on abdominal CT in hospital in August.  No chnages since hospitalization. concerning findings for  Cholecystitis. - Will likely refer to surgery at next visit for possible cholecystectomy at next visit  Abdominal pain Pain has been consistent over past few months.  Does not change with exertion. Given symptoms and exacerbation with food, likely partially due to GERD and Cholelithiasis.  Less concern for MI or other Cardiac proccess, though patient has had history of Stent placed 10 years ago and previously diagnosed with Paroxysmal A-Fib.  Echo obtained in August normal.  Concern for malignancy given previous bleed and no previous colonoscopy.  Also concern for Thyroid issue given weight gain, fatigue, and temperature insensitivities. - Will refer to GI for Colonoscopy OutPt, and make note that patient does not wish to be seen by [facility] - At next visit, will refer to Cardiology as it has been several years since patient has been seen by - Obtain TSH   Establish Care  - Obtain BMP, A1C, CBC and Lipid Panel  September, MD [facility]

## 2020-06-28 NOTE — Assessment & Plan Note (Signed)
Pain has been consistent over past few months.  Does not change with exertion. Given symptoms and exacerbation with food, likely partially due to GERD and Cholelithiasis.  Less concern for MI or other Cardiac proccess, though patient has had history of Stent placed 10 years ago and previously diagnosed with Paroxysmal A-Fib.  Echo obtained in August normal.  Concern for malignancy given previous bleed and no previous colonoscopy.  Also concern for Thyroid issue given weight gain, fatigue, and temperature insensitivities. - Will refer to GI for Colonoscopy OutPt, and make note that patient does not wish to be seen by [facility] - At next visit, will refer to Cardiology as it has been several years since patient has been seen by - Obtain TSH

## 2020-07-04 ENCOUNTER — Telehealth: Payer: Self-pay

## 2020-07-04 NOTE — Telephone Encounter (Signed)
Patient calls nurse line requesting lab results from 10/13. Please advise.

## 2020-07-06 ENCOUNTER — Encounter: Payer: Self-pay | Admitting: Internal Medicine

## 2020-07-06 NOTE — Telephone Encounter (Signed)
Patient returns call to nurse line requesting to speak with provider regarding lab results. To PCP  Veronda Prude, RN

## 2020-07-06 NOTE — Telephone Encounter (Signed)
Called patient and informed of lab results.  Told to make f/u appointment.

## 2020-07-22 ENCOUNTER — Other Ambulatory Visit: Payer: Self-pay

## 2020-07-22 ENCOUNTER — Encounter: Payer: Self-pay | Admitting: Family Medicine

## 2020-07-22 ENCOUNTER — Other Ambulatory Visit: Payer: Self-pay | Admitting: Family Medicine

## 2020-07-22 ENCOUNTER — Ambulatory Visit (INDEPENDENT_AMBULATORY_CARE_PROVIDER_SITE_OTHER): Payer: Medicare Other | Admitting: Family Medicine

## 2020-07-22 VITALS — BP 134/80 | HR 92 | Wt 231.0 lb

## 2020-07-22 DIAGNOSIS — K8020 Calculus of gallbladder without cholecystitis without obstruction: Secondary | ICD-10-CM

## 2020-07-22 DIAGNOSIS — Z23 Encounter for immunization: Secondary | ICD-10-CM

## 2020-07-22 DIAGNOSIS — I2510 Atherosclerotic heart disease of native coronary artery without angina pectoris: Secondary | ICD-10-CM | POA: Diagnosis not present

## 2020-07-22 MED ORDER — ATORVASTATIN CALCIUM 40 MG PO TABS: 40.0000 mg | ORAL_TABLET | Freq: Every day | ORAL | 3 refills | Status: DC

## 2020-07-22 MED FILL — ATORVASTATIN 40 MG TABLET: 40 | 90 days supply | Qty: 90 | Fill #0

## 2020-07-22 NOTE — Patient Instructions (Addendum)
It was good to see you today.  Thank you for coming in.  I think you have high cholesterol.      I am prescribing you Atorvastatin 40 mg to take every morning.  I am also putting in a referral to Cardiology.  They should call you with an appointment.  I would like to see you again in 1 month.  Please come back sooner if you have any significant chest pain or stomach pain or severe nausea and vomiting.  Be Well, Dean Morris

## 2020-07-24 NOTE — Assessment & Plan Note (Signed)
Seen on abdominal CT in hospital in August.  Denies any abdominal pain at this time.  Non-tender to palpation.   - Will likely refer to surgery outpatient at a later date - f/u in 1 month, return sooner if significant abdominal pain

## 2020-07-24 NOTE — Progress Notes (Signed)
    SUBJECTIVE:   CHIEF COMPLAINT / HPI: f/u appointment  Patient seen 1 month ago after establishing care here to follow-up on multiple medical problems.  Had hospitalization in August for BRBPR and abdominal pain.  Discovered patient had cholelithiasis.  Now has appointment scheduled for later this month for colonoscopy with GI.  Paitient has history of CAD with Stent placement but has not been seen by Cardiologist in 10 years.  Currently denies any abdominal pain or any other issues.    PERTINENT  PMH / PSH: CAD w/ Stent placement, Paroxysmal A-Fib  OBJECTIVE:   BP 134/80   Pulse 92   Wt 231 lb (104.8 kg)   SpO2 96%   BMI 37.28 kg/m    Physical Exam Constitutional:      Appearance: Normal appearance.  HENT:     Head: Normocephalic and atraumatic.     Mouth/Throat:     Mouth: Mucous membranes are moist.  Cardiovascular:     Rate and Rhythm: Normal rate and regular rhythm.     Pulses: Normal pulses.  Pulmonary:     Effort: Pulmonary effort is normal.     Breath sounds: Normal breath sounds.  Abdominal:     General: Abdomen is flat. Bowel sounds are normal. There is no distension.     Palpations: Abdomen is soft.     Tenderness: There is no abdominal tenderness.  Neurological:     Mental Status: He is alert.     ASSESSMENT/PLAN:   CAD (coronary artery disease) Patient has history of CAD and had stent placement 10 years ago and history of paroxysmal A-Fib.  Though he denies any symptoms of chest pain, palpitations, or shortness of breath that would be cause for acute concern, he has not seen cardiologist in over 10 years and feel it would be appropriate.  LDL last month 120, Triglycerides>500 (though not fasting) - Ambulatory referral to Cardiology - Will start Atorvastatin 40 mg  Cholelithiasis Seen on abdominal CT in hospital in August.  Denies any abdominal pain at this time.  Non-tender to palpation.   - Will likely refer to surgery outpatient at a later date -  f/u in 1 month, return sooner if significant abdominal pain     Jovita Kussmaul, MD [facility]

## 2020-07-24 NOTE — Assessment & Plan Note (Signed)
Patient has history of CAD and had stent placement 10 years ago and history of paroxysmal A-Fib.  Though he denies any symptoms of chest pain, palpitations, or shortness of breath that would be cause for acute concern, he has not seen cardiologist in over 10 years and feel it would be appropriate.  LDL last month 120, Triglycerides>500 (though not fasting) - Ambulatory referral to Cardiology - Will start Atorvastatin 40 mg

## 2020-08-17 ENCOUNTER — Ambulatory Visit (INDEPENDENT_AMBULATORY_CARE_PROVIDER_SITE_OTHER): Payer: Medicare Other | Admitting: Cardiology

## 2020-08-17 ENCOUNTER — Other Ambulatory Visit: Payer: Self-pay

## 2020-08-17 ENCOUNTER — Encounter: Payer: Self-pay | Admitting: Cardiology

## 2020-08-17 VITALS — BP 150/91 | HR 75 | Temp 98.4°F | Ht 66.0 in | Wt 237.4 lb

## 2020-08-17 DIAGNOSIS — Z7189 Other specified counseling: Secondary | ICD-10-CM | POA: Diagnosis not present

## 2020-08-17 DIAGNOSIS — E782 Mixed hyperlipidemia: Secondary | ICD-10-CM | POA: Insufficient documentation

## 2020-08-17 DIAGNOSIS — I10 Essential (primary) hypertension: Secondary | ICD-10-CM

## 2020-08-17 DIAGNOSIS — Z955 Presence of coronary angioplasty implant and graft: Secondary | ICD-10-CM | POA: Diagnosis not present

## 2020-08-17 DIAGNOSIS — I480 Paroxysmal atrial fibrillation: Secondary | ICD-10-CM

## 2020-08-17 DIAGNOSIS — I2510 Atherosclerotic heart disease of native coronary artery without angina pectoris: Secondary | ICD-10-CM | POA: Diagnosis not present

## 2020-08-17 NOTE — Progress Notes (Signed)
Cardiology Office Note:    Date:  08/17/2020   ID:  Dean Morris, DOB 1956-05-20, MRN [id number]  PCP:  Dean Kussmaul, MD  Cardiologist:  Dean Red, MD  Referring MD: Dean Morris, *   CC: new patient evaluation for history of CAD, atrial fibrillation  History of Present Illness:    Dean Morris is a 64 y.o. male with a hx of paroxysmal atrial fibrillation, coronary artery diease s/p stent in distant past, hypertension, hyperlipidemia who is seen as a new consult at the request of Dean Morris, * for the evaluation and management of atrial fibrillation, coronary artery disease.  Note from Dr. Pecola Morris, precepted by Dr. Deirdre Morris, reviewed from 07/22/20. Hasn't seen cardiologist in >10 years, referred to establish care. Lipids noted to be elevated, started on atorvastatin 40 mg daily at that visit.  I was able to review some records in Care Everywhere. Last seen 10/09/2016 at [facility] cardiology. Noted to have OM stent in 2013. Was in NSR but noted prior history of afib, only on aspirin. Per Care Everywhere, had afib RVR initially diagnosed 09/2016 when he was very ill.   Today: Generally doing well. Intermittent gallbladder pain, but no chest pain. Limited activity, walks with a cane. Notes palpitations about once every 2 mos, lasts only seconds. Doesn't recall what his afib felt like before when was in RVR. Has never been on anticoagulation.  Denies chest pain, shortness of breath at rest or with normal exertion. No PND, orthopnea, LE edema or unexpected weight gain. No syncope or palpitations.  Took his statin, aspirin, and amlodipine this AM. Has pain this AM. BP recently well controlled at office visit.  No family history of heart disease that he knows of.  No GI bleeding since the event that brought him into the hospital.  Past Medical History:  Diagnosis Date  . CAD (coronary artery disease)    stent  . Cholelithiasis   . Gout   . Hyperlipidemia   .  Hypertension   . Paroxysmal A-fib [facility])     Past Surgical History:  Procedure Laterality Date  . BLADDER REPAIR     unknown procedure  . CORONARY ANGIOPLASTY WITH STENT PLACEMENT      Current Medications: Current Outpatient Medications on File Prior to Visit  Medication Sig  . amLODipine (NORVASC) 10 MG tablet Take 1 tablet (10 mg total) by mouth daily.  Marland Kitchen aspirin EC 81 MG tablet Take 81 mg by mouth daily. Swallow whole.  Marland Kitchen atorvastatin (LIPITOR) 40 MG tablet Take 1 tablet (40 mg total) by mouth daily.  . fluticasone furoate-vilanterol (BREO ELLIPTA) 100-25 MCG/INH AEPB Inhale 1 puff into the lungs daily.  . pantoprazole (PROTONIX) 40 MG tablet Take 1 tablet (40 mg total) by mouth daily.   No current facility-administered medications on file prior to visit.     Allergies:   Patient has no known allergies.   Social History   Tobacco Use  . Smoking status: Former Smoker    Packs/day: 0.10    Years: 30.00    Pack years: 3.00    Types: Cigarettes    Quit date: 12/10/2019    Years since quitting: 0.6  . Smokeless tobacco: Never Used  Substance Use Topics  . Alcohol use: No  . Drug use: No    Family History: family history includes Heart failure in his brother and mother.  ROS:   Please see the history of present illness.  Additional pertinent ROS: Constitutional: Negative for chills, fever,  night sweats, unintentional weight loss  HENT: Negative for ear pain and hearing loss.   Eyes: Negative for loss of vision and eye pain.  Respiratory: Negative for cough, sputum, wheezing.   Cardiovascular: See HPI. Gastrointestinal: Negative for abdominal pain, melena, and hematochezia.  Genitourinary: Negative for dysuria and hematuria.  Musculoskeletal: Negative for falls and myalgias.  Skin: Negative for itching and rash.  Neurological: Negative for focal weakness, focal sensory changes and loss of consciousness.  Endo/Heme/Allergies: Does not bruise/bleed easily.      EKGs/Labs/Other Studies Reviewed:    The following studies were reviewed today: Echo 04/10/20 1. Left ventricular ejection fraction, by estimation, is 60 to 65%. The  left ventricle has normal function. The left ventricle has no regional  wall motion abnormalities. There is severe left ventricular hypertrophy.  Left ventricular diastolic parameters  are indeterminate.  2. Right ventricular systolic function is normal. The right ventricular  size is normal. There is normal pulmonary artery systolic pressure.  3. The mitral valve is normal in structure. No evidence of mitral valve  regurgitation. No evidence of mitral stenosis.  4. The aortic valve is tricuspid. Aortic valve regurgitation is not  visualized. No aortic stenosis is present.  5. Aortic dilatation noted. 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  6. The inferior vena cava is normal in size with greater than 50%  respiratory variability, suggesting right atrial pressure of 3 mmHg.   EKG:  EKG is personally reviewed.  The ekg ordered today demonstrates NSR at 75 bpm, IVCD, nonspecific ST-T wave pattern. Similar to ECG from 04/11/20. Of note, the ECG from 04/11/20 is labeled atrial fibrillation, but it is NSR with PVC  Recent Labs: 04/10/2020: ALT 40 06/22/2020: BUN 19; Creatinine, Ser 1.11; Hemoglobin 14.7; Platelets 301; Potassium 4.6; Sodium 142; TSH 2.060  Recent Lipid Panel    Component Value Date/Time   CHOL 250 (H) 06/22/2020 1503   TRIG 587 (HH) 06/22/2020 1503   HDL 26 (L) 06/22/2020 1503   CHOLHDL 9.6 (H) 06/22/2020 1503   LDLCALC 119 (H) 06/22/2020 1503    Physical Exam:    VS:  BP (!) 150/91   Pulse 75   Temp 98.4 F (36.9 C)   Ht 5\' 6"  (1.676 m)   Wt 237 lb 6.4 oz (107.7 kg)   SpO2 93%   BMI 38.32 kg/m     Wt Readings from Last 3 Encounters:  08/17/20 237 lb 6.4 oz (107.7 kg)  07/22/20 231 lb (104.8 kg)  06/22/20 223 lb 6.4 oz (101.3 kg)    GEN: Well nourished, well  developed in no acute distress HEENT: Normal, moist mucous membranes NECK: No JVD CARDIAC: regular rhythm, normal S1 and S2, no rubs or gallops. No murmur. VASCULAR: Radial and DP pulses 2+ bilaterally. No carotid bruits RESPIRATORY:  Clear to auscultation without rales, wheezing or rhonchi  ABDOMEN: Soft, non-tender, non-distended MUSCULOSKELETAL:  Ambulates independently SKIN: Warm and dry, no edema NEUROLOGIC:  Alert and oriented x 3. No focal neuro deficits noted. PSYCHIATRIC:  Normal affect    ASSESSMENT:    1. Paroxysmal A-fib (HCC)   2. Coronary artery disease involving native coronary artery of native heart without angina pectoris   3. History of coronary angioplasty with insertion of stent   4. Cardiac risk counseling   5. Counseling on health promotion and disease prevention   6. Mixed hyperlipidemia   7. Essential hypertension    PLAN:    History of paroxysmal  atrial fibrillation CHA2DS2/VAS Stroke Risk Points=3 -he reports only being told of the single episode when he was very ill and in the hospital -rare fleeting palpitations, very brief -we discussed the following: Afib is abnormal rhythm of the top chamber of the heart. It can be triggered by many different things, including stress, infection, surgery, age, etc. The rhythm itself, when not fast, is not life threatening. We make sure the heart rate is not fast, and we talk about the risk of small blood clots forming in the heart. When afib lasts for >48 hours, there is a risk of small clots forming and breaking loose, causing a stroke. We prevent this with blood thinning medication, but these medications also increase risk for bleeding. We discuss the balance between bleeding and stroke with each individual patient. -we discussed options, including empiric anticoagulation, 30 day monitor, or no change to therapy. He would like to continue with his current medical therapies, but if he has recurrent or persistent events he  will contact me.  History of CAD s/p OM stent in 2013 -continue aspirin 81 mg -recently started on atorvastatin 40 mg daily. Recheck lipids at his PCP follow up -normal EF as per echo -reviewed Morris flag warning signs that need immediate medical attention  Mixed hyperlipidemia: -recent lipid panel reviewed. Unknown if fasting. Notable for TG 587, LDL 119. -he was started on atorvastatin recently. For his recheck, recommend fasting labs given elevated TG -we discussed diet and exercise recommendations, as below  Hypertension: -elevated today, but reports he is in pain this AM -goal <130/80 -recent office checks have been better -Continue amlodipine, monitor  Cardiac risk counseling and prevention recommendations: -recommend heart healthy/Mediterranean diet, with whole grains, fruits, vegetable, fish, lean meats, nuts, and olive oil. Limit salt. -recommend moderate walking, 3-5 times/week for 30-50 minutes each session. Aim for at least 150 minutes.week. Goal should be pace of 3 miles/hours, or walking 1.5 miles in 30 minutes -recommend avoidance of tobacco products. Avoid excess alcohol. -Additional risk factor control:  -Diabetes risk: A1c is 5.6  -Weight: BMI 38, would benefit from weight loss  Plan for follow up: 1 year or sooner as needed  Dean Red, MD, PhD [place]  [facility]    Medication Adjustments/Labs and Tests Ordered: Current medicines are reviewed at length with the patient today.  Concerns regarding medicines are outlined above.  Orders Placed This Encounter  Procedures  . EKG 12-Lead   No orders of the defined types were placed in this encounter.   Patient Instructions  Medication Instructions:  Your Physician recommend you continue on your current medication as directed.    *If you need a refill on your cardiac medications before your next appointment, please call your pharmacy*   Lab  Work: None   Testing/Procedures: None   Follow-Up: At [facility], you and your health needs are our priority.  As part of our continuing mission to provide you with exceptional heart care, we have created designated Provider Care Teams.  These Care Teams include your primary Cardiologist (physician) and Advanced Practice Providers (APPs -  Physician Assistants and Nurse Practitioners) who all work together to provide you with the care you need, when you need it.  We recommend signing up for the patient portal called "MyChart".  Sign up information is provided on this After Visit Summary.  MyChart is used to connect with patients for Virtual Visits (Telemedicine).  Patients are able to view lab/test results, encounter notes, upcoming appointments, etc.  Non-urgent messages can be sent to your provider as well.   To learn more about what you can do with MyChart, go to [email or web address].    Your next appointment:   1 year(s)  The format for your next appointment:   In Person  Provider:   Jodelle Red, MD     Signed, Dean Red, MD PhD 08/17/2020 12:44 PM    [facility]

## 2020-08-17 NOTE — Patient Instructions (Signed)

## 2020-08-22 ENCOUNTER — Ambulatory Visit (AMBULATORY_SURGERY_CENTER): Payer: Self-pay

## 2020-08-22 ENCOUNTER — Ambulatory Visit (INDEPENDENT_AMBULATORY_CARE_PROVIDER_SITE_OTHER): Payer: Medicare Other | Admitting: Family Medicine

## 2020-08-22 ENCOUNTER — Encounter: Payer: Self-pay | Admitting: Family Medicine

## 2020-08-22 ENCOUNTER — Other Ambulatory Visit: Payer: Self-pay | Admitting: Internal Medicine

## 2020-08-22 ENCOUNTER — Other Ambulatory Visit: Payer: Self-pay

## 2020-08-22 VITALS — Ht 66.0 in | Wt 238.0 lb

## 2020-08-22 VITALS — BP 136/84 | HR 100 | Ht 66.0 in | Wt 240.4 lb

## 2020-08-22 DIAGNOSIS — K8020 Calculus of gallbladder without cholecystitis without obstruction: Secondary | ICD-10-CM

## 2020-08-22 DIAGNOSIS — T148XXA Other injury of unspecified body region, initial encounter: Secondary | ICD-10-CM

## 2020-08-22 DIAGNOSIS — K219 Gastro-esophageal reflux disease without esophagitis: Secondary | ICD-10-CM

## 2020-08-22 DIAGNOSIS — I2510 Atherosclerotic heart disease of native coronary artery without angina pectoris: Secondary | ICD-10-CM

## 2020-08-22 DIAGNOSIS — S39012A Strain of muscle, fascia and tendon of lower back, initial encounter: Secondary | ICD-10-CM | POA: Diagnosis not present

## 2020-08-22 DIAGNOSIS — Z1211 Encounter for screening for malignant neoplasm of colon: Secondary | ICD-10-CM

## 2020-08-22 MED ORDER — DICLOFENAC SODIUM 1 % EX GEL: 2.0000 g | Freq: Every day | CUTANEOUS | 0 refills | Status: DC | PRN

## 2020-08-22 MED ORDER — DICLOFENAC SODIUM 1 % EX GEL: 2.0000 g | Freq: Every day | CUTANEOUS | Status: DC | PRN

## 2020-08-22 MED ORDER — NA SULFATE-K SULFATE-MG SULF 17.5-3.13-1.6 GM/177ML PO SOLN: 1.0000 | Freq: Once | ORAL | 0 refills | Status: DC

## 2020-08-22 MED ORDER — PANTOPRAZOLE SODIUM 20 MG PO TBEC: 20.0000 mg | DELAYED_RELEASE_TABLET | Freq: Every day | ORAL | 5 refills | Status: DC

## 2020-08-22 MED FILL — PANTOPRAZOLE SOD DR 20 MG T: 20 | 30 days supply | Qty: 30 | Fill #0

## 2020-08-22 MED FILL — DICLOFENAC SODIUM 1 % GEL: 1 | 50 days supply | Qty: 100 | Fill #0

## 2020-08-22 NOTE — Progress Notes (Signed)
    SUBJECTIVE:   CHIEF COMPLAINT / HPI: Back Pain  Dean Morris is coming in today due to back pain.  Indicates it has been going on for 2 weeks.  Was engaging in heavy lifting at the time it began.  Indicates the pain is located in his lower back bilaterally.  Denies ths pain radiating down his leg.  Is about a 6-7 when waking up in the morning then resolves throughout the day. No loss of bowel or bladder control.  Patient expresses worry about his kidenys due to AKI 4 months ago but reassured patient kidney function was normal at visit 2 months ago.  Patient also complaining of upper abdominal due to burning.  Indicates this was well controlled by Protonix, which he had been prescribed when leaving hospital, but worsened after running out recently.  PERTINENT  PMH / PSH: GERD  OBJECTIVE:   BP 136/84   Pulse 100   Ht 5\' 6"  (1.676 m)   Wt 240 lb 6.4 oz (109 kg)   SpO2 96%   BMI 38.80 kg/m    Physical Exam Constitutional:      General: He is not in acute distress.    Appearance: Normal appearance. He is not ill-appearing.  HENT:     Mouth/Throat:     Mouth: Mucous membranes are moist.  Cardiovascular:     Rate and Rhythm: Normal rate and regular rhythm.     Pulses: Normal pulses.  Pulmonary:     Effort: Pulmonary effort is normal.     Breath sounds: Normal breath sounds.  Abdominal:     General: Abdomen is flat. There is no distension.     Palpations: Abdomen is soft.     Tenderness: There is abdominal tenderness in the left upper quadrant. There is no guarding or rebound. Negative signs include Murphy's sign.  Musculoskeletal:     Lumbar back: Tenderness present. No swelling, lacerations or spasms. Decreased range of motion.     Comments: Bilateral tenderness to palpation in lumbar region, no tenderness over spine, pain with forward flexion, lateral flexion limited due to pain on both sides  Neurological:     Mental Status: He is alert.     ASSESSMENT/PLAN:   Acute  myofascial strain of lumbar region Likely diagnosis given specific history of lifting injury and tenderness to palpation.  Less likely spinal fracture, cauda equina syndrome or herniated disc.  Do not feel warrants any imaging at this time.  Want to avoid NSAID use given history of CAD. - Use Tylenol PO 1000 mg and topical Voltaren gel as needed in morning to manage pain - Return in 2 weeks if pain not improving, or sooner if pain significantly worsening  GERD (gastroesophageal reflux disease) Pain previously well controlled with Protonix 40 mg. - Start patient on Protonix 20 mg daily, can increase dose if continues to have reflux - f/u in 6 months  Cholelithiasis Seen on Abdominal CT and RUQ while in hospital.  No signs of chloecystitis. Recommended following up with Surgery otpatient No RUQ tnederness to palpation or Murphy's sign.  - Referall to surgery for consideration of possible cholcystectomy      , MD [facility]

## 2020-08-22 NOTE — Progress Notes (Signed)
No egg or soy allergy known to patient  No issues with past sedation with any surgeries or procedures No intubation problems in the past  No FH of Malignant Hyperthermia No diet pills per patient No home 02 use per patient  No blood thinners per patient  Pt reports issues with constipation-will advise pt to take Miralax 5 days prior to prep  No A fib or A flutter  COVID 19 guidelines implemented in PV today with Pt and RN  Coupon given to pt in PV today , Code to Pharmacy  COVID screening scheduled on 09/01/2020 at  Due to the COVID-19 pandemic we are asking patients to follow these guidelines. Please only bring one care partner. Please be aware that your care partner may wait in the car in the parking lot or if they feel like they will be too hot to wait in the car, they may wait in the lobby on the 4th floor. All care partners are required to wear a mask the entire time (we do not have any that we can provide them), they need to practice social distancing, and we will do a Covid check for all patient's and care partners when you arrive. Also we will check their temperature and your temperature. If the care partner waits in their car they need to stay in the parking lot the entire time and we will call them on their cell phone when the patient is ready for discharge so they can bring the car to the front of the building. Also all patient's will need to wear a mask into building.

## 2020-08-22 NOTE — Assessment & Plan Note (Signed)
Seen on Abdominal CT and RUQ while in hospital.  No signs of chloecystitis. Recommended following up with Surgery otpatient No RUQ tnederness to palpation or Murphy's sign.  - Referall to surgery for consideration of possible cholcystectomy

## 2020-08-22 NOTE — Assessment & Plan Note (Signed)
Pain previously well controlled with Protonix 40 mg. - Start patient on Protonix 20 mg daily, can increase dose if continues to have reflux - f/u in 6 months

## 2020-08-22 NOTE — Patient Instructions (Addendum)
It was good to see you today.  Thank you for coming in.  I think you have muscle starin in your lower back.  I recommend taking Tylenol 1000 mg in the morning and rubbing Voltaren gel on your back.  If this does not get better in 2 weeks or starts to get worse, please come back to see me.  I will also prescribe you Protonix 20 mg to help with your acid reflux.  I am also putting in a referral to Surgery for them to call you.   Be Well, Dr Pecola Leisure

## 2020-08-22 NOTE — Assessment & Plan Note (Addendum)
Likely diagnosis given specific history of lifting injury and tenderness to palpation.  Less likely spinal fracture, cauda equina syndrome or herniated disc.  Do not feel warrants any imaging at this time.  Want to avoid NSAID use given history of CAD. - Use Tylenol PO 1000 mg and topical Voltaren gel as needed in morning to manage pain - Return in 2 weeks if pain not improving, or sooner if pain significantly worsening

## 2020-08-30 MED FILL — SUPREP BOWEL PREP KIT: 17.5-3.13-1 | 354 days supply | Qty: 354 | Fill #0

## 2020-09-01 ENCOUNTER — Other Ambulatory Visit: Payer: Self-pay | Admitting: Internal Medicine

## 2020-09-02 LAB — SARS CORONAVIRUS 2 (TAT 6-24 HRS): SARS Coronavirus 2: NEGATIVE

## 2020-09-05 ENCOUNTER — Telehealth: Payer: Self-pay | Admitting: Internal Medicine

## 2020-09-05 NOTE — Telephone Encounter (Signed)
Ok, thanks Building control surveyor for tomorrow, will need rescheduling

## 2020-09-05 NOTE — Telephone Encounter (Signed)
Hi Dr. Rhea Belton. This patient left a message with the answering service on 09/04/20 at 8:31am cancelling his procedure for tomorrow 09/06/20 because he has the flu. I spoke with him and he stated that he will call back to r/s. Thank you.

## 2020-09-05 NOTE — Telephone Encounter (Signed)
Pt states he will call to reschedule.

## 2020-09-06 ENCOUNTER — Encounter: Payer: Medicaid Other | Admitting: Internal Medicine

## 2021-06-08 ENCOUNTER — Ambulatory Visit: Payer: Medicare Other | Admitting: Family Medicine

## 2021-07-26 ENCOUNTER — Ambulatory Visit: Payer: Medicare (Managed Care)

## 2021-07-27 ENCOUNTER — Other Ambulatory Visit (HOSPITAL_COMMUNITY): Payer: Self-pay

## 2021-07-27 ENCOUNTER — Ambulatory Visit (INDEPENDENT_AMBULATORY_CARE_PROVIDER_SITE_OTHER): Payer: Medicare (Managed Care) | Admitting: Family Medicine

## 2021-07-27 ENCOUNTER — Ambulatory Visit (HOSPITAL_COMMUNITY)
Admission: RE | Admit: 2021-07-27 | Discharge: 2021-07-27 | Disposition: A | Discharge status: Home / Self Care | Payer: Medicare (Managed Care) | Source: Ambulatory Visit | Attending: Family Medicine | Admitting: Family Medicine

## 2021-07-27 ENCOUNTER — Other Ambulatory Visit: Payer: Self-pay

## 2021-07-27 ENCOUNTER — Encounter: Payer: Self-pay | Admitting: Family Medicine

## 2021-07-27 VITALS — BP 170/100 | HR 90 | Ht 66.0 in | Wt 221.8 lb

## 2021-07-27 DIAGNOSIS — M25461 Effusion, right knee: Secondary | ICD-10-CM | POA: Diagnosis not present

## 2021-07-27 DIAGNOSIS — M25561 Pain in right knee: Secondary | ICD-10-CM

## 2021-07-27 DIAGNOSIS — Z5982 Transportation insecurity: Secondary | ICD-10-CM

## 2021-07-27 DIAGNOSIS — Z1159 Encounter for screening for other viral diseases: Secondary | ICD-10-CM | POA: Diagnosis not present

## 2021-07-27 DIAGNOSIS — Z23 Encounter for immunization: Secondary | ICD-10-CM

## 2021-07-27 DIAGNOSIS — I10 Essential (primary) hypertension: Secondary | ICD-10-CM

## 2021-07-27 DIAGNOSIS — Z1211 Encounter for screening for malignant neoplasm of colon: Secondary | ICD-10-CM

## 2021-07-27 MED ORDER — DICLOFENAC SODIUM 1 % EX GEL
CUTANEOUS | 0 refills | Status: AC
  Filled 2021-07-27: qty 100, 50d supply, fill #0
  Filled 2021-07-27: qty 100, 10d supply, fill #0

## 2021-07-27 NOTE — Progress Notes (Signed)
    SUBJECTIVE:   Chief compliant/HPI: annual examination  Dean Morris is a 65 y.o. who presents today for an annual exam.   He reports his R knee has been swollen and painful for the past 5 days. No inciting injury or trauma . He states occasionally his knee feels hot. States worse at night. Denies radiation of pain though states he has R ankle pain as well. Has tried taking tylenol without relief. Denies fever, chills.   He also reports he has trouble breathing and feels stuff moving around in his chest. Notes he has felt that way for the past 5 days.   States he has been trying to watch what he eats. Denies exercise due to his arthritis   Denis smoking tobacco, recreational drugs, alcohol   History of hypertension. Stopped taking BP medication about 8 months ago. Family history of heart failure, brother died of CHF   OBJECTIVE:   BP (!) 170/100   Pulse 90   Ht 5\' 6"  (1.676 m)   Wt 221 lb 12.8 oz (100.6 kg)   SpO2 96%   BMI 35.80 kg/m    General: alert, pleasant, NAD CV: RRR no murmurs Resp: CTAB normal WOB GI: soft, non distended  MSK: R knee non non erythematous or warm to the touch. Moderately edematous. Tender to palpation primarily posterior to knee and calf . Mildly decreased ROM and strength  ASSESSMENT/PLAN:   No problem-specific Assessment & Plan notes found for this encounter.   Annual Examination  See AVS for age appropriate recommendations.  PHQ score 0, reviewed and discussed.  Blood pressure value is 170/100 and is not at goal, discussed. Denies CP, SOB, headaches, vision changes.  Considered the following screening exams based upon USPSTF recommendations: Diabetes screening:  not ordered, normal 1 year ago though should consider at follow up Screening for elevated cholesterol: ordered Hepatitis C: ordered Reviewed risk factors for latent tuberculosis and not indicated Colorectal cancer screening: discussed, colonoscopy ordered Lung cancer screening:  denies tobacco use   R knee pain and swelling Occurring for the past 5 days. No trauma or injury so unlikely to be fractured. Non erythematous or warm to touch, low likelihood of septic arthritis. Given tenderness posterior to knee and R calf, will get to assess for DVT although no significant risk factors. Pain and swelling likely due to arthritis but will also obtain XR. Recommended elevation, ice, Tylenol for pain. Also prescribed Voltaren gel for his knee pain.   HLD Given very elevated cholesterol, triglycerides a year ago recommended restarting his statin. Will check lipid panel today as well  Health maintenance - discussed colonnoscopy and placed referral.  - received flu vaccine   SDOH Patient does not have transportation. Wanted to talk to someone about transportation services. He may benefit from additional community resources so placed referral to care management   Return in 1 month with PCP for follow up on BP and knee pain   [place], DO [facility]

## 2021-07-27 NOTE — Patient Instructions (Signed)
It was great seeing you today!  Today you came in for your physical, and also with knee pain.  We are getting an ultrasound to check for clots, and an x-ray as well.   For concern for your breathing and wanting to check for potential COPD, we will place a consult to our pharmacy team for pulmonary function testing, and also assistance with your medication.  Today you received your flu vaccine. We gave you the form for your colonoscopy to call and schedule to get that done.  We also discussed needing your pneumonia vaccine, shingles vaccine, and tetanus.  The last 2 you can get at the pharmacy, and the pneumonia vaccine we can do for you at your next follow-up.  I placed a social work consult for help with transportation  For your blood pressure I would like you to take some measurements at home with a blood pressure cuff you can get at the pharmacy or on [employer].  Please check your blood pressure in the morning and at night for at least 3 days, and bring those values in to your next visit.  Please check-out at the front desk before leaving the clinic.  Schedule to see your PCP within the next 4 weeks to follow-up on blood pressure, knee pain, and other health maintenance items, but if you need to be seen earlier than that for any new issues we're happy to fit you in, just give [place] a call!  Visit Reminders: - Stop by the pharmacy to pick up your prescriptions  - Continue to work on your healthy eating habits and incorporating exercise into your daily life.   Feel free to call with any questions or concerns at any time, at [phone].   Take care,  Dr. Cora Collum [facility]

## 2021-07-28 LAB — CBC
Hematocrit: 43.6 % (ref 37.5–51.0)
Hemoglobin: 14.4 g/dL (ref 13.0–17.7)
MCH: 28.3 pg (ref 26.6–33.0)
MCHC: 33 g/dL (ref 31.5–35.7)
MCV: 86 fL (ref 79–97)
Platelets: 367 10*3/uL (ref 150–450)
RBC: 5.08 x10E6/uL (ref 4.14–5.80)
RDW: 12.6 % (ref 11.6–15.4)
WBC: 10.8 10*3/uL (ref 3.4–10.8)

## 2021-07-28 LAB — LIPID PANEL
Chol/HDL Ratio: 7.2 ratio — ABNORMAL HIGH (ref 0.0–5.0)
Cholesterol, Total: 245 mg/dL — ABNORMAL HIGH (ref 100–199)
HDL: 34 mg/dL — ABNORMAL LOW (ref >39)
LDL Chol Calc (NIH): 180 mg/dL — ABNORMAL HIGH (ref 0–99)
Triglycerides: 166 mg/dL — ABNORMAL HIGH (ref 0–149)
VLDL Cholesterol Cal: 31 mg/dL (ref 5–40)

## 2021-07-28 LAB — BASIC METABOLIC PANEL
BUN/Creatinine Ratio: 11 (ref 10–24)
BUN: 14 mg/dL (ref 8–27)
CO2: 23 mmol/L (ref 20–29)
Calcium: 10.3 mg/dL — ABNORMAL HIGH (ref 8.6–10.2)
Chloride: 98 mmol/L (ref 96–106)
Creatinine, Ser: 1.3 mg/dL — ABNORMAL HIGH (ref 0.76–1.27)
Glucose: 96 mg/dL (ref 70–99)
Potassium: 5.2 mmol/L (ref 3.5–5.2)
Sodium: 138 mmol/L (ref 134–144)
eGFR: 61 mL/min/{1.73_m2} (ref >59)

## 2021-07-28 LAB — HCV INTERPRETATION

## 2021-07-28 LAB — HCV AB W REFLEX TO QUANT PCR: HCV Ab: 0.1 s/co ratio (ref 0.0–0.9)

## 2021-07-31 ENCOUNTER — Other Ambulatory Visit: Payer: Self-pay | Admitting: Family Medicine

## 2021-08-01 ENCOUNTER — Telehealth: Payer: Self-pay

## 2021-08-01 NOTE — Telephone Encounter (Signed)
   Telephone encounter was:  Successful.  08/01/2021 Name: Dean Morris MRN: [id number] DOB: July 21, 1956  Dean Morris is a 65 y.o. year old male who is a primary care patient of [name], DO . The community resource team was consulted for assistance with Transportation Needs   Care guide performed the following interventions:  Pt advised his next appt is 08/31/2021. He walks with a cane but may need some assistance getting in and out of vehicle due to weakness in legs. .  Follow Up Plan:  Care guide will follow up with patient by phone over the next few days to provide next steps.  [facility] Guide, Embedded Care Coordination [facility]  [place], [place] [address]  Main Phone: [phone]  E-mail: [email or web address]  Website: [email or web address]

## 2021-08-07 ENCOUNTER — Telehealth: Payer: Self-pay

## 2021-08-07 NOTE — Telephone Encounter (Signed)
Patient calls nurse line requesting recent imaging results.   Will forward to provider who saw patient.

## 2021-08-09 ENCOUNTER — Other Ambulatory Visit: Payer: Self-pay | Admitting: Family Medicine

## 2021-08-09 ENCOUNTER — Telehealth: Payer: Self-pay

## 2021-08-09 DIAGNOSIS — M25561 Pain in right knee: Secondary | ICD-10-CM

## 2021-08-09 MED ORDER — AMLODIPINE BESYLATE 5 MG PO TABS: 5.0000 mg | ORAL_TABLET | Freq: Every day | ORAL | 3 refills | Status: DC

## 2021-08-09 MED ORDER — ATORVASTATIN CALCIUM 40 MG PO TABS: ORAL_TABLET | Freq: Every day | ORAL | 3 refills | Status: DC

## 2021-08-09 NOTE — Progress Notes (Signed)
Called patient to follow up on his R knee pain. He states it is still painful and swollen, and that he know has sharp L knee pain. Discussed results from his recent X ray as well as his blood work. Urged him to start taking his medications. Refilled his atorvastatin and restarted Amlodipine. He is scheduled to return in December for follow up with his PCP. Should have kidney function rechecked. Also referred to sports medicine for his knee pain

## 2021-08-09 NOTE — Telephone Encounter (Signed)
Patient needs medications sent to [facility] in [place].   Medications have been sent and cancelled at previous location.

## 2021-08-16 ENCOUNTER — Ambulatory Visit: Payer: Medicaid Other | Admitting: Sports Medicine

## 2021-08-21 ENCOUNTER — Telehealth: Payer: Self-pay

## 2021-08-21 NOTE — Telephone Encounter (Signed)
   Telephone encounter was:  Successful.  08/21/2021 Name: Dean Morris MRN: [id number] DOB: 1955/12/16  Dean Morris is a 65 y.o. year old male who is a primary care patient of [name], DO . The community resource team was consulted for assistance with Transportation Needs   Care guide performed the following interventions:  I called pt to inform him his that he has reached his trip limits with [facility] Medicare. I advised pt, that due to this reason I called Medicaid and Medicaid let me know that pt has to complete their enrollment assessment first. Medicaid advised pt would be contacted more than likely today. Pt has been advised to look out for their call .  Follow Up Plan:  Care guide will follow up with patient by phone over the next few days to ensure Medicaid transportation is arranged for 08/31/2021.  [facility] Guide, Embedded Care Coordination [facility]  [place], [place] [address]  Main Phone: [phone]  E-mail: [email or web address]  Website: [email or web address]

## 2021-08-23 ENCOUNTER — Telehealth: Payer: Self-pay

## 2021-08-23 NOTE — Telephone Encounter (Signed)
° °  Telephone encounter was:  Successful.  08/23/2021 Name: Dean Morris MRN: [id number] DOB: 03/22/1956  Dean Morris is a 65 y.o. year old male who is a primary care patient of [name], DO . The community resource team was consulted for assistance with Transportation Needs   Care guide performed the following interventions:  Outreach for further assistance: A patient access specialist reach out to me regarding pt. Rep advised: "[name] call and stated that Medicare called him back and said he has been dropped and there's nothing that they can do". Therefore, I called pt back to retrieve further information and clarification.  I explained to pt on the 1st call: "I called pt to inform him that he has reached his trip limits with [facility] Medicare. I advised pt, that due to this reason of maxed out, I called Medicaid and Medicaid let me know that pt has to complete their enrollment assessment first. Medicaid advised pt would be contacted more than likely today. Pt has been advised to look out for their call."   Today 12/14 call: I reached out to patient to see if Medicaid called to arrange the trip for 12/22, pt advised no then he inquired "so did Medicare drop me" I advised to pt no they are only not allowed to provide you rides for this year but no worries because we have contacted Medicaid for you. He then stated "I should have [employer] Medicare til the end of the year and [employer] Medicare next year". I advised pt, that is what I understood as well when booking your ride as well. Pt still seemed confused so then I furthered explain all insurances are active and there are no changed on his account, pt stated he understood. I offered conferences calls with Lora Paula and Medicaid to ensure all of pts confusion and questions were answered, however pt denied the offered and said he just will allow [facility] to drop because he has been scammed and this is what is causing all of his confusion  because he does not know who to trust. Pt then stated he has already received his insurance cards for Monia Pouch so he will be glad when next year starts.   In conclusion: Pt will continue to wait on a call from Medicaid for the ride on 12/22.  Follow Up Plan:  Care guide will follow up with patient by phone over the next 2 days.  [facility] Guide, Embedded Care Coordination [facility]  [place], [place] [address]  Main Phone: [phone]   E-mail: [email or web address]  Website: [email or web address]

## 2021-08-23 NOTE — Telephone Encounter (Signed)
° °  Telephone encounter was:  Successful.  08/23/2021 Name: Lakin Romer MRN: [id number] DOB: [birth year]  Drequan Ironside is a 65 y.o. year old male who is a primary care patient of [name], DO . The community resource team was consulted for assistance with Transportation Needs   Care guide performed the following interventions:  Pt has advised he has not been contacted by Medicaid just yet. Therefore, I will be calling Medicaid to check on the process.  Follow Up Plan:  Care guide will follow up with patient by phone over the next few days.   [facility] Guide, Embedded Care Coordination [facility]  [place], [place] [address]  Main Phone: [phone]   E-mail: [email or web address]  Website: [email or web address]

## 2021-08-24 ENCOUNTER — Telehealth: Payer: Self-pay

## 2021-08-24 NOTE — Telephone Encounter (Signed)
° °  Telephone encounter was:  Successful.  08/24/2021 Name: Merdith Boyd MRN: [id number] DOB: May 16, 1956  Brevon Dewald is a 65 y.o. year old male who is a primary care patient of [name], DO . The community resource team was consulted for assistance with Transportation Needs   Care guide performed the following interventions:  Advised pt that I spoke to [facility] and they advised me pt trip is still in progress. The representative advised the request should be completed by today for the trip on 08/31/2021. Pt has been made aware of this and he fully understands.  Follow Up Plan:  Care guide will follow up with patient by phone over the next day.  [facility] Guide, Embedded Care Coordination [facility]  [place], [place] [address]  Main Phone: [phone]   E-mail: [email or web address]  Website: [email or web address]

## 2021-08-25 ENCOUNTER — Telehealth: Payer: Self-pay

## 2021-08-25 NOTE — Telephone Encounter (Signed)
° °  Telephone encounter was:  Successful.  08/25/2021 Name: Dean Morris MRN: [id number] DOB: 27-Nov-1955  Dean Morris is a 65 y.o. year old male who is a primary care patient of [name], DO . The community resource team was consulted for assistance with Transportation Needs   Care guide performed the following interventions:  Patient called in advising Medicaid has contacted him and confirmed with him that the trip for 08/31/2021 is all set. Pt understands to call Medicaid for all future rides. Pt has been educated on Medicaid's contact number as well. Pt stated he understands.  Follow Up Plan:  No further follow up planned at this time. The patient has been provided with needed resources. I spoke to Medicaid to confirm they had pt's correct address and correct destination.   [facility] Guide, Embedded Care Coordination [facility]  [place], [place] [address]  Main Phone: [phone]   E-mail: [email or web address]  Website: [email or web address]

## 2021-08-25 NOTE — Telephone Encounter (Signed)
° °  Telephone encounter was:  Successful.  08/25/2021 Name: Dean Morris MRN: [id number] DOB: 06/17/1956  Dean Morris is a 65 y.o. year old male who is a primary care patient of [name], DO . The community resource team was consulted for assistance with Transportation Needs   Care guide performed the following interventions:  Patient called in advising Medicaid has contacted him and confirmed with him that the trip for 08/31/2021 is all set. Pt understands to call Medicaid for all future rides. Pt has been educated on Medicaid's contact number as well. Pt stated he understands.  Follow Up Plan:  No further follow up planned at this time. The patient has been provided with needed resources.  [facility] Guide, Embedded Care Coordination [facility]  [place], [place] [address]  Main Phone: [phone]   E-mail: [email or web address]  Website: [email or web address]

## 2021-08-30 NOTE — Progress Notes (Signed)
°  SUBJECTIVE:   CHIEF COMPLAINT / HPI:   Patient presents for follow up of hypertension and hyperlipidemia. Found to be in A. Fib. during encounter.  HTN/A. fib: restarted on Amlodipine last encounter.  Endorses chest pain that has been present for the last 3 months.  He states he is not have any new chest pain at this time. Endorses palpitations. Denies worsening shortness of breath on exertion.  Endorses episodes of dizziness that are "not that bad" without any falls.  Patient has seen cardiology in the past and has an appointment with them in January.  He is not currently on any anticoagulation or rate/rhythm control.  Right foot pain: Pt states he has had new onset pain on his right foot in the last 3-4 days.  Denies trauma but states it is painful to walk on.  He does not think his foot is broken because "I wouldn't be able to walk on it".  He states it is similar to his other gout attacks he has had in the past.  He has tried pain medication which has helped some.  He could not state exactly what he took.  PERTINENT  PMH / PSH: CAD, MI, HTN, HLD, GERD, Paroxysmal A. Fib  OBJECTIVE:  BP (!) 152/100    Pulse 67    Ht 5\' 6"  (1.676 m)    Wt 223 lb 12.8 oz (101.5 kg)    SpO2 99%    BMI 36.12 kg/m    General: NAD, pleasant, able to participate in exam Cardiac: irregularly irregular, no murmurs Respiratory: CTAB, normal effort, No wheezes, rales or rhonchi Extremities: 1+ pitting edema of BLEs, tenderness and erythema along medial portion of right foot, 1.5 inch hard asymmetric subcutaneous nodule above left 1st MTP joint Skin: warm and dry, no rashes noted       ASSESSMENT/PLAN:  Essential hypertension Restarted on amlodipine at last visit.  Will receive blood pressure benefits from recently started metoprolol due to concern of A. fib with RVR at this visit.  Will address HTN at a later visit given more pressing concerns at this time.  Paroxysmal A-fib (HCC) CHADS2VASC score of 3.   EKG shows A. fib with RVR rate of ~125, no ST elevations.  Started on Eliquis 5 mg twice daily and metoprolol succinate 25 mg twice daily, advised to stop ASA 81mg .  Discussed red flags and advised to call 911 or go to ED if they present.  Follow-up in 1 week.  Cardiology follow-up already scheduled in 1 month.  Mixed hyperlipidemia Restarted on atorvastatin 40 mg daily at last visit.  BMP and lipid panel rechecked today.  Right foot pain New onset right foot pain in the last 3 to 4 days.  Concern for stress fracture versus acute gout.  Patient amenable to foot x-ray but would not clearly state he would go due to not believing it was broken.  Advised foot x-ray to patient however also offered colchicine for acute gout relief.  Colchicine 1.2 mg x 1 followed by 0.6 mg 1 hour after.  Uric acid checked today.  Return in about 1 week (around 09/07/2021) for Atrial fibrillation follow up. Advised return in 2-4 weeks for HTN follow-up.   , DO 08/31/2021, 11:11 PM PGY-1, [facility]

## 2021-08-31 ENCOUNTER — Other Ambulatory Visit: Payer: Self-pay

## 2021-08-31 ENCOUNTER — Ambulatory Visit (INDEPENDENT_AMBULATORY_CARE_PROVIDER_SITE_OTHER): Payer: Medicare (Managed Care) | Admitting: Student

## 2021-08-31 ENCOUNTER — Encounter: Payer: Self-pay | Admitting: Student

## 2021-08-31 ENCOUNTER — Other Ambulatory Visit: Payer: Self-pay | Admitting: Student

## 2021-08-31 VITALS — BP 152/100 | HR 67 | Ht 66.0 in | Wt 223.8 lb

## 2021-08-31 DIAGNOSIS — I480 Paroxysmal atrial fibrillation: Secondary | ICD-10-CM | POA: Diagnosis not present

## 2021-08-31 DIAGNOSIS — I10 Essential (primary) hypertension: Secondary | ICD-10-CM | POA: Diagnosis not present

## 2021-08-31 DIAGNOSIS — R002 Palpitations: Secondary | ICD-10-CM

## 2021-08-31 DIAGNOSIS — M79671 Pain in right foot: Secondary | ICD-10-CM | POA: Insufficient documentation

## 2021-08-31 DIAGNOSIS — M109 Gout, unspecified: Secondary | ICD-10-CM | POA: Insufficient documentation

## 2021-08-31 DIAGNOSIS — E782 Mixed hyperlipidemia: Secondary | ICD-10-CM

## 2021-08-31 MED ORDER — COLCHICINE 0.6 MG PO TABS: 0.6000 mg | ORAL_TABLET | Freq: Every day | ORAL | 0 refills | Status: DC

## 2021-08-31 MED ORDER — METOPROLOL TARTRATE 50 MG PO TABS: 50.0000 mg | ORAL_TABLET | Freq: Two times a day (BID) | ORAL | 0 refills | Status: DC

## 2021-08-31 MED ORDER — APIXABAN 5 MG PO TABS
5.0000 mg | ORAL_TABLET | Freq: Two times a day (BID) | ORAL | 0 refills | Status: DC
Start: 2021-08-31 — End: 2021-09-27

## 2021-08-31 NOTE — Assessment & Plan Note (Addendum)
CHADS2VASC score of 3.  EKG shows A. fib with RVR rate of ~125, no ST elevations.  Started on Eliquis 5 mg twice daily and metoprolol succinate 25 mg twice daily, advised to stop ASA 81mg .  Discussed red flags and advised to call 911 or go to ED if they present.  Follow-up in 1 week.  Cardiology follow-up already scheduled in 1 month.

## 2021-08-31 NOTE — Patient Instructions (Signed)
It was great to see you today! Thank you for choosing [facility] for your primary care. Dean Morris was seen for HTN, HLD, right foot pain.  Our plans for today were:  -hypertension and hyperlipidemia: We are pausing these concerns currently given your acute concerns. -Right foot pain: My suspicion is that this is gout given the acute nature and your history with gout in the past.  However, I would like to get a right foot x-ray to rule out stress fracture.  Directions for this are at the bottom of this note.  If the foot x-ray is fine, I will send you medications to the pharmacy. -A. fib: Because you are in A. fib currently and not receiving any prophylactic medication, I would like to start you on a blood thinner and rate control medication.  Please take these until you see your cardiologist in January.  Should you fall, please immediately go to the emergency department.  You are at an increased risk for bleeding on the Eliquis.  We are checking some labs today. If they are abnormal, I will call you. If they are normal, I will send you a MyChart message or a letter. If you do not hear about your labs in the next 2 weeks, please let Dean Morris know.  You should return to our clinic in 1 week for Afib follow up. Please also follow up in the next 2-4 weeks for HTN and gout follow up.  I recommend that you always bring your medications to each appointment as this makes it easy to ensure you are on the correct medications and helps Dean Morris not miss refills when you need them.  Please arrive 15 minutes before your appointment to ensure smooth check in process.  We appreciate your efforts in making this happen.  Take care and seek immediate care sooner if you develop any concerns.   Thank you for allowing me to participate in your care, [name], DO 08/31/2021, 2:36 PM PGY-1, [facility]   An x-ray was ordered for you---you do not need an appointment to have this completed.  I  recommend going to [facility] [address] [place] [address] [place]   If the results are normal,I will send you a letter  I will call you with results if anything is abnormal

## 2021-08-31 NOTE — Assessment & Plan Note (Signed)
Restarted on atorvastatin 40 mg daily at last visit.  BMP and lipid panel rechecked today.

## 2021-08-31 NOTE — Assessment & Plan Note (Signed)
New onset right foot pain in the last 3 to 4 days.  Concern for stress fracture versus acute gout.  Patient amenable to foot x-ray but would not clearly state he would go due to not believing it was broken.  Advised foot x-ray to patient however also offered colchicine for acute gout relief.  Colchicine 1.2 mg x 1 followed by 0.6 mg 1 hour after.  Uric acid checked today.

## 2021-08-31 NOTE — Assessment & Plan Note (Deleted)
New onset right foot pain in the last 3 to 4 days.  Concern for stress fracture versus acute gout.  Patient amenable to foot x-ray but would not clearly state he would go due to not believing it was broken.  Advised foot x-ray to patient however also offered colchicine for acute gout relief.  Colchicine 1.2 mg x 1 followed by 0.6 mg 1 hour after.  Uric acid checked today.

## 2021-08-31 NOTE — Assessment & Plan Note (Addendum)
Restarted on amlodipine at last visit.  Will receive blood pressure benefits from recently started metoprolol due to concern of A. fib with RVR at this visit.  Will address HTN at a later visit given more pressing concerns at this time.

## 2021-09-01 ENCOUNTER — Ambulatory Visit: Payer: Medicare (Managed Care) | Admitting: Student

## 2021-09-01 LAB — LIPID PANEL
Chol/HDL Ratio: 4.9 ratio (ref 0.0–5.0)
Cholesterol, Total: 132 mg/dL (ref 100–199)
HDL: 27 mg/dL — ABNORMAL LOW (ref >39)
LDL Chol Calc (NIH): 79 mg/dL (ref 0–99)
Triglycerides: 149 mg/dL (ref 0–149)
VLDL Cholesterol Cal: 26 mg/dL (ref 5–40)

## 2021-09-01 LAB — BASIC METABOLIC PANEL
BUN/Creatinine Ratio: 20 (ref 10–24)
BUN: 21 mg/dL (ref 8–27)
CO2: 25 mmol/L (ref 20–29)
Calcium: 9.7 mg/dL (ref 8.6–10.2)
Chloride: 102 mmol/L (ref 96–106)
Creatinine, Ser: 1.03 mg/dL (ref 0.76–1.27)
Glucose: 93 mg/dL (ref 70–99)
Potassium: 5.5 mmol/L — ABNORMAL HIGH (ref 3.5–5.2)
Sodium: 142 mmol/L (ref 134–144)
eGFR: 80 mL/min/{1.73_m2} (ref >59)

## 2021-09-01 LAB — URIC ACID: Uric Acid: 7.6 mg/dL (ref 3.8–8.4)

## 2021-09-01 NOTE — Progress Notes (Deleted)
    SUBJECTIVE:   CHIEF COMPLAINT / HPI:   Having rhinorrhea, sneezing, itchy eyes  Chronic pain Sore back, arms  PERTINENT  PMH / PSH: ***  OBJECTIVE:   Vitals:   10/30/22 0855  BP: 130/60  Pulse: (!) 58  Temp: 97.7 F (36.5 C)  SpO2: 98%    General: NAD, pleasant, able to participate in exam Cardiac: RRR, no murmurs. Respiratory: CTAB, normal effort, No wheezes, rales or rhonchi Abdomen: Bowel sounds present, nontender, nondistended, no hepatosplenomegaly. Extremities: no edema or cyanosis. Skin: warm and dry, no rashes noted Neuro: alert, no obvious focal deficits Psych: Normal affect and mood  ASSESSMENT/PLAN:   No problem-specific Assessment & Plan notes found for this encounter.     Dr. Tharun Cappella, DO [facility]    {    This will disappear when note is signed, click to select method of visit    :1}  

## 2021-09-04 ENCOUNTER — Telehealth: Payer: Self-pay | Admitting: Student

## 2021-09-04 NOTE — Telephone Encounter (Signed)
Attempted to reach patient via phone. Unavailable. Unable to leave VM.

## 2021-09-05 ENCOUNTER — Telehealth: Payer: Self-pay | Admitting: Student

## 2021-09-05 NOTE — Telephone Encounter (Signed)
Spoke with patient over the phone. He verified he is taking Eliquis at appropriate dosing. Has not yet picked up Metoprolol or Colchicine. He will pick them up on 09/12/21. Advised patient to take Metoprolol 25mg  (1/2 tab) twice daily. He verified plan. Also discussed elevated potassium level. Recommend recheck at follow up with Dr. on 12/29. Pt aware of plan. States he may have trouble getting ride to office visit but will do his best. Advised pt to call ahead if you believes that may be difficult. Uric acid level 7.6. Plan for allopurinol prescription for therapeutic target <6.8 in future office visit.

## 2021-09-06 ENCOUNTER — Telehealth: Payer: Self-pay

## 2021-09-06 NOTE — Telephone Encounter (Signed)
° °  Telephone encounter was:  Successful.  09/06/2021 Name: Dean Morris MRN: [id number] DOB: March 04, 1956  Dean Morris is a 65 y.o. year old male who is a primary care patient of [name], DO . The community resource team was consulted for assistance with Transportation Needs   Care guide performed the following interventions:  Ride scheduled with Medicaid for 09/14/2021 .  Follow Up Plan:  No further follow up planned at this time. The patient has been provided with needed resources.  [facility] Guide, Embedded Care Coordination [facility]  [place], [place] [address]  Main Phone: [phone]   E-mail: [name]  Website: [email or web address]

## 2021-09-07 ENCOUNTER — Ambulatory Visit: Payer: Medicare (Managed Care) | Admitting: Student

## 2021-09-12 ENCOUNTER — Telehealth: Payer: Self-pay

## 2021-09-12 NOTE — Telephone Encounter (Signed)
° °  Telephone encounter was:  Successful.  09/12/2021 Name: Dean Morris MRN: [id number] DOB: 07/05/1956  Dean Morris is a 66 y.o. year old male who is a primary care patient of [name], DO . The community resource team was consulted for assistance with Transportation Needs   Care guide performed the following interventions:  Patient called in advising someone called him to let him know that his transportation has been cancelled. I called Medicaid and inquired about what happened. The representative on the line could not tell me what was going on therefore I requested a Production designer, theatre/television/film . I will await a call from Ms. Sena Slate or Ms. Bell from the transportation department with Medicaid. All the representative told me was that she could see ride was scheduled 12/28 and it says unassigned now.  Follow Up Plan:   Wait on return call from [facility].  [facility] Guide, Embedded Care Coordination [facility]  [place], [place] [address]  Main Phone: [phone]   E-mail: [email or web address]  Website: [email or web address]

## 2021-09-12 NOTE — Telephone Encounter (Signed)
° °  Telephone encounter was:  Successful.  09/12/2021 Name: Dean Morris MRN: [id number] DOB: 06-26-1956  Dean Morris is a 66 y.o. year old male who is a primary care patient of [name], DO . The community resource team was consulted for assistance with Transportation Needs   Care guide performed the following interventions:  Ms. Gloriann Loan from [facility] Transportation [phone]) called in advising ride is still scheduled for 1/5 and is scheduled with [employer]. Pt has been advised. Per Ms. Bell, [facility] [employer] will contact pt with a pick up time.  Pt understands this as well.  Follow Up Plan:  No further follow up planned at this time. The patient has been provided with needed resources.  [facility] management  [place], [place]  Main Phone: [phone]   E-mail: [email or web address]  Website: [email or web address]

## 2021-09-14 ENCOUNTER — Encounter: Payer: Self-pay | Admitting: Student

## 2021-09-14 ENCOUNTER — Telehealth: Payer: Self-pay | Admitting: Family Medicine

## 2021-09-14 ENCOUNTER — Ambulatory Visit (INDEPENDENT_AMBULATORY_CARE_PROVIDER_SITE_OTHER): Payer: Medicare HMO | Admitting: Student

## 2021-09-14 ENCOUNTER — Encounter: Payer: Self-pay | Admitting: Student in an Organized Health Care Education/Training Program

## 2021-09-14 ENCOUNTER — Other Ambulatory Visit: Payer: Self-pay

## 2021-09-14 VITALS — BP 133/98 | HR 53 | Wt 222.8 lb

## 2021-09-14 DIAGNOSIS — I480 Paroxysmal atrial fibrillation: Secondary | ICD-10-CM

## 2021-09-14 DIAGNOSIS — R079 Chest pain, unspecified: Secondary | ICD-10-CM

## 2021-09-14 MED ORDER — ASPIRIN 325 MG PO TABS
325.0000 mg | ORAL_TABLET | Freq: Once | ORAL | Status: AC
  Administered 2021-09-14: 325 mg via ORAL

## 2021-09-14 MED ORDER — METOPROLOL TARTRATE 50 MG PO TABS: 50.0000 mg | ORAL_TABLET | Freq: Two times a day (BID) | ORAL | 0 refills | Status: DC

## 2021-09-14 MED ORDER — NITROGLYCERIN 0.4 MG SL SUBL
0.4000 mg | SUBLINGUAL_TABLET | Freq: Once | SUBLINGUAL | Status: AC
  Administered 2021-09-14: 0.4 mg via SUBLINGUAL

## 2021-09-14 MED ORDER — ASPIRIN EC 81 MG PO TBEC: 81.0000 mg | DELAYED_RELEASE_TABLET | Freq: Every day | ORAL | 11 refills | Status: DC

## 2021-09-14 NOTE — Progress Notes (Signed)
I was paged by [facility] regarding a critical lab value for Dean Morris REF# [id number]. When I spoke to the representative from [facility] she informed me that there is not a critical lab value to report on this patient and she believes the page was sent in error.

## 2021-09-14 NOTE — Progress Notes (Signed)
° ° °  SUBJECTIVE:   CHIEF COMPLAINT / HPI: follow up for a fib, chest pain  A fib Pt was seen on 08/31/21 and was newly diagnosed with a. Fib. Pt was prescribed eliquis 5 mg BID and metoprolol succinate 25 mg BID. Pt states he is taking these medications and has a cardiology appointment scheduled for 09/29/21. He started taking the metoprolol 2 days ago and states it has made him feel tired. He has a mild headache and tinitis. He denies any dizziness or lightheadedness. He denies any palpitations.   Chest pain He admits to mild mid sternal chest pain yesterday and today. He describes it as an ache and rates it at a 2/10.  He denies nausea or vomiting. He denies any radiation of the pain into his neck or arms. It is not worse with exertion and is constant.   PERTINENT  PMH / PSH: CAD, a fib, HTN  OBJECTIVE:   Vitals:   09/14/21 1440  BP: (!) 133/98  Pulse: (!) 53  SpO2: 99%     General: NAD, pleasant, able to participate in exam Cardiac: irregularly irregular Respiratory: CTAB, normal effort, No wheezes, rales or rhonchi MSK: chest pain non reproducible upon palpation  Neuro: alert, no obvious focal deficits Psych: Normal affect and mood  ASSESSMENT/PLAN:   A fib EKG showed afib with RVR. Ventricular rate in the 120's.  -increase metoprolol to 50 mg BID -pt to call cardiologist for sooner apt if possible -continue eliquis 5 mg BID  Chest pain  Pts chest pain was non reproducible on palpation. His pain did improve after receiving 325 mg ASA and nitroglycerin 0.4 mg in the office. With pts history of CAD and recent diagnosis of a fib he was advised to go to the [facility] ED. Pt refused but was agreeable to an office work up. EKG showed a fib with RVR. STAT BMP ordered d/t elevated potassium seen on 08/31/21 and STAT troponin ordered. Pt was advised to resume taking 81 mg ASA daily in addition to eliquis. He denies any blood in stools currently and was advised that ASA and eliquis can  increase risk of bleeding.  -ASA 81 mg daily -nitroglycerin 0.4 mg prn for chest pain -pt to go to ED if chest pain worsens -follow up labs -return in 1 week for follow up if labs are WNL  Dr. Precious Gilding, [place]

## 2021-09-14 NOTE — Patient Instructions (Addendum)
IF YOU CHANGE YOUR MIND, PLEASE GO TO HOSPITAL. IF YOUR CHEST PAIN GETS WORSE, IF YOU HAVE SHORTNESS OF BREATH, SWEATING, HEART RACING, LEG SWELLING, PASSING OUT, YOU NEED TO GO TO THE ED.  It was great to see you! Thank you for allowing me to participate in your care!  I recommend that you always bring your medications to each appointment as this makes it easy to ensure you are on the correct medications and helps [place] not miss when refills are needed.  Our plans for today:  - We gave you aspirin and nitroglycerin in clinic, we have sent you some as needed nitroglycerin for chest pain, if you have worsening pain you need to take this and call 911 - We recommend restarting an aspirin 81 mg, if you have blood in your stool or dark black stools this can increase your risk of bleeding - I recommend you call your cardiologist office Dr Cristal Deer at [phone] to see if you can be seen earlier for your appointment - Increase your metoprolol tonight to 50 mg (one WHOLE tablet) twice daily  We are checking a lab to look at your heart today, if elevated we will call you and will continue to recommend you be seen in the hospital/ED.   Please schedule a follow up in 1 week to reassess your symptoms.  Dr. Erick Alley, DO [facility]

## 2021-09-14 NOTE — Telephone Encounter (Signed)
Received an after-hours page from [facility] regarding this patient's labs today.  Listed reference [id number].  I called [facility] confirmed with the reference number as well as the patient's name and date of birth.  I informed [facility] that this was paged to me as if it were for critical lab value.  The individual spoke with at [facility] states that he has a BMP and a troponin both of which are noncritical.  We confirmed the reference number and patient's name and date of birth second time and he looked over the labs and said he did not have any indication of a critical lab value.  He is unsure why we received a page.  Will forward to PCP and to the provider who saw the patient and order the labs to keep a watch for when the labs are released.

## 2021-09-15 ENCOUNTER — Telehealth: Payer: Self-pay | Admitting: Student

## 2021-09-15 LAB — BASIC METABOLIC PANEL
BUN/Creatinine Ratio: 21 (ref 10–24)
BUN: 24 mg/dL (ref 8–27)
CO2: 23 mmol/L (ref 20–29)
Calcium: 9.5 mg/dL (ref 8.6–10.2)
Chloride: 103 mmol/L (ref 96–106)
Creatinine, Ser: 1.13 mg/dL (ref 0.76–1.27)
Glucose: 97 mg/dL (ref 70–99)
Potassium: 4.4 mmol/L (ref 3.5–5.2)
Sodium: 144 mmol/L (ref 134–144)
eGFR: 72 mL/min/{1.73_m2} (ref >59)

## 2021-09-15 LAB — TROPONIN T: Troponin T (Highly Sensitive): 20 ng/L (ref 0–22)

## 2021-09-15 MED ORDER — NITROGLYCERIN 0.4 MG SL SUBL: 0.4000 mg | SUBLINGUAL_TABLET | SUBLINGUAL | 1 refills | Status: DC | PRN

## 2021-09-15 NOTE — Telephone Encounter (Signed)
Spoke with pt on the phone regarding troponin and BMP from yesterday which are both WNL. Pt states he is no longer have chest pain today.

## 2021-09-27 ENCOUNTER — Other Ambulatory Visit: Payer: Self-pay | Admitting: Student

## 2021-09-27 DIAGNOSIS — I480 Paroxysmal atrial fibrillation: Secondary | ICD-10-CM

## 2021-09-27 NOTE — Progress Notes (Signed)
°  SUBJECTIVE:   CHIEF COMPLAINT / HPI:   A. Fib: Denies chest pain or palpitations at this time. He has been compliant with medications. He has cardiology appointment tomorrow.   Gout: denies ever taking colchicine which was prescribed on 08/31/21. Uric acid 7.6 at that time. Amenable to starting uric acid lowering treatment at this time.  PHQ-9: score of 9 but with question 9 answering 1. Pt states "who came up with that survey anyway", "I'd never do that for so many reasons as if the thought of hurting myself ever pops into my head, it leaves immediately". Notes protective factors of being a [place] and wanting to go to heaven, stating he is too lazy to do it, and having loved ones in his life.   PERTINENT  PMH / PSH: CAD, A. Fib, HTN  OBJECTIVE:  BP 126/89    Pulse 81    Ht 5\' 6"  (1.676 m)    Wt 221 lb 6.4 oz (100.4 kg)    SpO2 98%    BMI 35.73 kg/m    General: NAD, pleasant, able to participate in exam Cardiac: irregularly irregular c/w Afib, normal rate, no murmurs auscultated Psych: Normal affect and mood  ASSESSMENT/PLAN:  Paroxysmal A-fib (HCC) Stable on Eliquis 5mg  BID and Metoprolol 50mg  BID. Cardiology appointment tomorrow. Future TSH order placed.   Essential hypertension HTN stable at this time. Discontinued amlodipine, started on Losartan 50mg  given Losartan will additionally give uric acid decreasing benefit. Also receiving blood pressure reducing effects of metoprolol 50mg  BID at this time.  Gout Uric acid 7.6 on 08/31/21. Given several gout flares in the past, goal uric acid <6.0. Started on losartan 50mg  daily given uric acid reducing effects. Will recheck uric acid upon future visit. BMP future order placed.  PHQ-9  Addressed question 9 answer. Reassured by protective factors and conversation with patient.   [name], DO 09/28/2021, 5:21 PM PGY-1, [facility]

## 2021-09-28 ENCOUNTER — Other Ambulatory Visit: Payer: Self-pay

## 2021-09-28 ENCOUNTER — Encounter: Payer: Self-pay | Admitting: Student

## 2021-09-28 ENCOUNTER — Ambulatory Visit (INDEPENDENT_AMBULATORY_CARE_PROVIDER_SITE_OTHER): Payer: Medicare HMO | Admitting: Student

## 2021-09-28 VITALS — BP 126/89 | HR 81 | Ht 66.0 in | Wt 221.4 lb

## 2021-09-28 DIAGNOSIS — I480 Paroxysmal atrial fibrillation: Secondary | ICD-10-CM

## 2021-09-28 DIAGNOSIS — I10 Essential (primary) hypertension: Secondary | ICD-10-CM | POA: Diagnosis not present

## 2021-09-28 DIAGNOSIS — M10071 Idiopathic gout, right ankle and foot: Secondary | ICD-10-CM | POA: Diagnosis not present

## 2021-09-28 MED ORDER — LOSARTAN POTASSIUM 50 MG PO TABS: 50.0000 mg | ORAL_TABLET | Freq: Every day | ORAL | 3 refills | Status: DC

## 2021-09-28 NOTE — Patient Instructions (Signed)
It was great to see you today! Thank you for choosing [facility] for your primary care. Dean Morris was seen for A. fib and now.  Our plans for today were:  -Paroxysmal A. Fib: please continue to take the Eliquis and metoprolol until seeing cardiology tomorrow.  I will send a message to your cardiologist for future labs that I would like drawn.  If you do not get labs drawn tomorrow, please return to our clinic to have these labs drawn. -Gout: I have started you on a blood pressure medication which also has uric acid lowering effects which is called losartan.  You may take this every day.  Because we have started you on this, I have discontinued your amlodipine you may stop taking.   You should return to our clinic in 1 month for blood pressure and gout follow-up.   I recommend that you always bring your medications to each appointment as this makes it easy to ensure you are on the correct medications and helps [place] not miss refills when you need them.  Please arrive 15 minutes before your appointment to ensure smooth check in process.  We appreciate your efforts in making this happen.  Take care and seek immediate care sooner if you develop any concerns.   Thank you for allowing me to participate in your care, Dean Guiles, DO 09/28/2021, 11:25 AM PGY-1, [place]

## 2021-09-28 NOTE — Assessment & Plan Note (Addendum)
Uric acid 7.6 on 08/31/21. Given several gout flares in the past, goal uric acid <6.0. Started on losartan 50mg  daily given uric acid reducing effects. Will recheck uric acid upon future visit. BMP future order placed.

## 2021-09-28 NOTE — Assessment & Plan Note (Signed)
Stable on Eliquis 5mg  BID and Metoprolol 50mg  BID. Cardiology appointment tomorrow. Future TSH order placed.

## 2021-09-28 NOTE — Assessment & Plan Note (Signed)
HTN stable at this time. Discontinued amlodipine, started on Losartan 50mg  given Losartan will additionally give uric acid decreasing benefit. Also receiving blood pressure reducing effects of metoprolol 50mg  BID at this time.

## 2021-09-29 ENCOUNTER — Ambulatory Visit (INDEPENDENT_AMBULATORY_CARE_PROVIDER_SITE_OTHER): Payer: Medicare HMO | Admitting: Cardiology

## 2021-09-29 ENCOUNTER — Encounter (HOSPITAL_BASED_OUTPATIENT_CLINIC_OR_DEPARTMENT_OTHER): Payer: Self-pay | Admitting: Cardiology

## 2021-09-29 VITALS — BP 144/88 | HR 118 | Ht 66.0 in | Wt 223.0 lb

## 2021-09-29 DIAGNOSIS — I2510 Atherosclerotic heart disease of native coronary artery without angina pectoris: Secondary | ICD-10-CM | POA: Diagnosis not present

## 2021-09-29 DIAGNOSIS — I10 Essential (primary) hypertension: Secondary | ICD-10-CM | POA: Diagnosis not present

## 2021-09-29 DIAGNOSIS — I480 Paroxysmal atrial fibrillation: Secondary | ICD-10-CM | POA: Diagnosis not present

## 2021-09-29 DIAGNOSIS — Z7189 Other specified counseling: Secondary | ICD-10-CM

## 2021-09-29 MED ORDER — APIXABAN 5 MG PO TABS: 5.0000 mg | ORAL_TABLET | Freq: Two times a day (BID) | ORAL | 11 refills | Status: DC

## 2021-09-29 MED ORDER — METOPROLOL TARTRATE 50 MG PO TABS: 100.0000 mg | ORAL_TABLET | Freq: Two times a day (BID) | ORAL | 0 refills | Status: DC

## 2021-09-29 NOTE — Patient Instructions (Signed)
Medication Instructions:   1.) Increase Metoprolol to 100 mg twice a day  *If you need a refill on your cardiac medications before your next appointment, please call your pharmacy*   Lab Work: None ordered today   Testing/Procedures: None ordered today   Follow-Up: At [facility], you and your health needs are our priority.  As part of our continuing mission to provide you with exceptional heart care, we have created designated Provider Care Teams.  These Care Teams include your primary Cardiologist (physician) and Advanced Practice Providers (APPs -  Physician Assistants and Nurse Practitioners) who all work together to provide you with the care you need, when you need it.  We recommend signing up for the patient portal called "MyChart".  Sign up information is provided on this After Visit Summary.  MyChart is used to connect with patients for Virtual Visits (Telemedicine).  Patients are able to view lab/test results, encounter notes, upcoming appointments, etc.  Non-urgent messages can be sent to your provider as well.   To learn more about what you can do with MyChart, go to [email or web address].    Your next appointment:   2-3 week(s)  The format for your next appointment:   In Person  Provider:   Jodelle Red, MD or Gillian Shields, NP

## 2021-09-29 NOTE — Progress Notes (Signed)
Cardiology Office Note:    Date:  09/29/2021   ID:  Dean Morris, DOB 03-08-1956, MRN [id number]  PCP:  [name], DO  Cardiologist:  Buford Dresser, MD  Referring MD: [name], DO   CC: follow-up  History of Present Illness:    Dean Morris is a 66 y.o. male with a hx of paroxysmal atrial fibrillation, coronary artery disease s/p stent in distant past, hypertension, hyperlipidemia who is seen for follow-up. I initially met him 08/17/2020 as a new consult at the request of [name], DO for the evaluation and management of atrial fibrillation, coronary artery disease.  CV history: Last seen 10/09/2016 at [facility] cardiology. Noted to have OM stent in 2013. Was in NSR but noted prior history of afib, only on aspirin. Per Care Everywhere, had afib RVR initially diagnosed 09/2016 when he was very ill.   Today: Overall, he does not have any concerns. At this time he is not able to feel any palpitations or any other arrhythmic symptoms. No recent chest pain. He notes having chest pain only prior to "getting his pills right." He confirms not missing any doses of his anticoagulation regimen. He was surprised to hear he was in atrial fibrillation with RVR today.  At night, he has been experiencing sputum production. Yesterday, he notes that wheezing was heard during his visit with his PCP.  For the past month he has suffered from gout in his knees.  Occasionally after standing he will stagger a bit, but he states this is not due to near-syncopal symptoms.  He denies any shortness of breath. No lightheadedness, headaches, orthopnea, PND, lower extremity edema or exertional symptoms.  Past Medical History:  Diagnosis Date   Allergy    seasonal allergies   Anxiety    Arthritis    bilateral hands   Asthma    uses inhaler   CAD (coronary artery disease)    stent   Cholelithiasis    Chronic kidney disease    CKD-   Depression    GERD (gastroesophageal reflux disease)     not on meds at this time   Gout    Hyperlipidemia    on meds   Hypertension    not on meds at this time   Myocardial infarction [facility]) 2011   hx of   Paroxysmal A-fib ([place])     Past Surgical History:  Procedure Laterality Date   BLADDER REPAIR  1990's   unknown procedure   CORONARY ANGIOPLASTY WITH STENT PLACEMENT     TONSILLECTOMY      Current Medications: Current Outpatient Medications on File Prior to Visit  Medication Sig   aspirin EC 81 MG tablet Take 1 tablet (81 mg total) by mouth daily. Swallow whole.   atorvastatin (LIPITOR) 40 MG tablet TAKE 1 TABLET (40 MG TOTAL) BY MOUTH DAILY.   diclofenac Sodium (VOLTAREN) 1 % GEL APPLY 2 GRAMS TOPICALLY DAILY AS NEEDED.   losartan (COZAAR) 50 MG tablet Take 1 tablet (50 mg total) by mouth at bedtime.   nitroGLYCERIN (NITROSTAT) 0.4 MG SL tablet Place 1 tablet (0.4 mg total) under the tongue every 5 (five) minutes as needed for chest pain.   No current facility-administered medications on file prior to visit.     Allergies:   Patient has no known allergies.   Social History   Tobacco Use   Smoking status: Former    Packs/day: 0.10    Years: 30.00    Pack years: 3.00    Types:  Cigarettes    Quit date: 12/10/2019    Years since quitting: 1.8   Smokeless tobacco: Former    Quit date: 2021  Vaping Use   Vaping Use: Never used  Substance Use Topics   Alcohol use: Yes    Comment: 2-3 x per month   Drug use: No    Family History: family history includes Colon cancer (age of onset: 33) in his maternal grandmother; Heart failure in his brother and mother. There is no history of Esophageal cancer, Rectal cancer, or Stomach cancer.  ROS:   Please see the history of present illness. (+) Instability  (+) Sputum production All other systems are reviewed and negative.    EKGs/Labs/Other Studies Reviewed:    The following studies were reviewed today:  Bilateral LE Venous DVT 07/27/2021: Summary:  RIGHT:  - There is no  evidence of deep vein thrombosis in the lower extremity.  - No cystic structure found in the popliteal fossa.     LEFT:  - No evidence of common femoral vein obstruction.   Echo 04/10/20 1. Left ventricular ejection fraction, by estimation, is 60 to 65%. The  left ventricle has normal function. The left ventricle has no regional  wall motion abnormalities. There is severe left ventricular hypertrophy.  Left ventricular diastolic parameters   are indeterminate.   2. Right ventricular systolic function is normal. The right ventricular  size is normal. There is normal pulmonary artery systolic pressure.   3. The mitral valve is normal in structure. No evidence of mitral valve  regurgitation. No evidence of mitral stenosis.   4. The aortic valve is tricuspid. Aortic valve regurgitation is not  visualized. No aortic stenosis is present.   5. Aortic dilatation noted. 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   6. The inferior vena cava is normal in size with greater than 50%  respiratory variability, suggesting right atrial pressure of 3 mmHg.   EKG:  EKG is personally reviewed.   09/29/2021: atrial fibrillation RVR at 118 bpm. IVCD 08/17/2020: NSR at 75 bpm, IVCD, nonspecific ST-T wave pattern. Similar to ECG from 04/11/20. Of note, the ECG from 04/11/20 is labeled atrial fibrillation, but it is NSR with PVC  Recent Labs: 07/27/2021: Hemoglobin 14.4; Platelets 367 09/14/2021: BUN 24; Creatinine, Ser 1.13; Potassium 4.4; Sodium 144   Recent Lipid Panel    Component Value Date/Time   CHOL 132 08/31/2021 1505   TRIG 149 08/31/2021 1505   HDL 27 (L) 08/31/2021 1505   CHOLHDL 4.9 08/31/2021 1505   LDLCALC 79 08/31/2021 1505    Physical Exam:    VS:  BP (!) 144/88 (BP Location: Left Arm, Patient Position: Sitting, Cuff Size: Normal)    Pulse (!) 118    Ht '5\' 6"'  (1.676 m)    Wt 223 lb (101.2 kg)    BMI 35.99 kg/m     Wt Readings from Last 3 Encounters:  09/29/21 223  lb (101.2 kg)  09/28/21 221 lb 6.4 oz (100.4 kg)  09/14/21 222 lb 12.8 oz (101.1 kg)    GEN: Well nourished, well developed in no acute distress HEENT: Normal, moist mucous membranes NECK: No JVD CARDIAC: irregularly irregular rhythm, normal S1 and S2, no rubs or gallops. No murmur. Manual pulse reading averages in the 90s VASCULAR: Radial and DP pulses 2+ bilaterally. No carotid bruits RESPIRATORY:  Clear to auscultation without rales, wheezing or rhonchi  ABDOMEN: Soft, non-tender, non-distended MUSCULOSKELETAL:  Ambulates independently SKIN: Warm  and dry, no edema NEUROLOGIC:  Alert and oriented x 3. No focal neuro deficits noted. PSYCHIATRIC:  Normal affect    ASSESSMENT:    1. Coronary artery disease involving native coronary artery of native heart without angina pectoris   2. Paroxysmal A-fib ([place])   3. Essential hypertension   4. Counseling on health promotion and disease prevention   5. Paroxysmal atrial fibrillation with RVR (HCC)     PLAN:    paroxysmal atrial fibrillation, in RVR today CHA2DS2/VAS Stroke Risk Points=3 -started on apixaban 08/31/21. Denies any missed doses. About to finish his 30 days and does not have more. Will given samples today, refill ordered (initial script written without refills) -noted to be in afib RVR 08/31/21, 09/14/21. Documented with HR in the 80s yesterday. Very irregular today, ranging 90s-120s heart rate -we discussed options, including titrating rate control medications versus cardioversion. He would like to trial medical management, and if we cannot get rate control, then proceed with cardioversion -he has a way to check BP/HR at home, asked him to keep a log. He will contact me if resting rates are consistently high (>100) -we will increase metoprolol to 100 mg BID and see him back shortly. -he has labs, including TSH, ordered. Recommended he have these done to make sure it isn't a thyroid issue driving this -counseled on red flag  warning signs that need immediate medical attention -he has not had any more chest discomfort "since they got my pills right." He has difficulty describing the pain, but it is nonexertional. Had Tn drawn at PCP visit. Question whether this is due to his RVR. Discussed when to go to ER.  History of CAD s/p OM stent in 2013 Mixed hyperlpidemia -continue aspirin 81 mg -recently restarted on atorvastatin 40 mg daily. -normal EF as per echo -reviewed red flag warning signs that need immediate medical attention  Hypertension: -elevated today, increasing metoprolol as above -goal <130/80 -recent office checks have been better -changed from amlodipine to losartan due to gout/elevated uric acid levels  Cardiac risk counseling and prevention recommendations: -recommend heart healthy/Mediterranean diet, with whole grains, fruits, vegetable, fish, lean meats, nuts, and olive oil. Limit salt. -recommend moderate walking, 3-5 times/week for 30-50 minutes each session. Aim for at least 150 minutes.week. Goal should be pace of 3 miles/hours, or walking 1.5 miles in 30 minutes -recommend avoidance of tobacco products. Avoid excess alcohol.  Plan for follow up: 2 weeks or sooner as needed   Medication Adjustments/Labs and Tests Ordered: Current medicines are reviewed at length with the patient today.  Concerns regarding medicines are outlined above.   Orders Placed This Encounter  Procedures   EKG 12-Lead   Meds ordered this encounter  Medications   metoprolol tartrate (LOPRESSOR) 50 MG tablet    Sig: Take 2 tablets (100 mg total) by mouth 2 (two) times daily.    Dispense:  360 tablet    Refill:  0    **Patient requests 90 days supply**   apixaban (ELIQUIS) 5 MG TABS tablet    Sig: Take 1 tablet (5 mg total) by mouth 2 (two) times daily.    Dispense:  60 tablet    Refill:  11   Patient Instructions  Medication Instructions:   1.) Increase Metoprolol to 100 mg twice a day  *If you need a  refill on your cardiac medications before your next appointment, please call your pharmacy*   Lab Work: None ordered today   Testing/Procedures: None ordered today  Follow-Up: At [facility], you and your health needs are our priority.  As part of our continuing mission to provide you with exceptional heart care, we have created designated Provider Care Teams.  These Care Teams include your primary Cardiologist (physician) and Advanced Practice Providers (APPs -  Physician Assistants and Nurse Practitioners) who all work together to provide you with the care you need, when you need it.  We recommend signing up for the patient portal called "MyChart".  Sign up information is provided on this After Visit Summary.  MyChart is used to connect with patients for Virtual Visits (Telemedicine).  Patients are able to view lab/test results, encounter notes, upcoming appointments, etc.  Non-urgent messages can be sent to your provider as well.   To learn more about what you can do with MyChart, go to [email or web address].    Your next appointment:   2-3 week(s)  The format for your next appointment:   In Person  Provider:   Buford Dresser, MD or Laurann Montana, NP      [facility],acting as a scribe for Buford Dresser, MD.,have documented all relevant documentation on the behalf of Buford Dresser, MD,as directed by  Buford Dresser, MD while in the presence of Buford Dresser, MD.  I, Buford Dresser, MD, have reviewed all documentation for this visit. The documentation on 09/29/21 for the exam, diagnosis, procedures, and orders are all accurate and complete.   Signed, Buford Dresser, MD PhD 09/29/2021 5:52 PM    [place]

## 2021-10-18 ENCOUNTER — Other Ambulatory Visit: Payer: Self-pay

## 2021-10-18 ENCOUNTER — Ambulatory Visit (INDEPENDENT_AMBULATORY_CARE_PROVIDER_SITE_OTHER): Payer: Medicare HMO | Admitting: Family

## 2021-10-18 ENCOUNTER — Encounter (HOSPITAL_BASED_OUTPATIENT_CLINIC_OR_DEPARTMENT_OTHER): Payer: Self-pay | Admitting: Family

## 2021-10-18 VITALS — BP 128/80 | HR 95 | Ht 66.0 in | Wt 228.3 lb

## 2021-10-18 DIAGNOSIS — I25118 Atherosclerotic heart disease of native coronary artery with other forms of angina pectoris: Secondary | ICD-10-CM

## 2021-10-18 DIAGNOSIS — D6859 Other primary thrombophilia: Secondary | ICD-10-CM

## 2021-10-18 DIAGNOSIS — E785 Hyperlipidemia, unspecified: Secondary | ICD-10-CM | POA: Diagnosis not present

## 2021-10-18 DIAGNOSIS — I480 Paroxysmal atrial fibrillation: Secondary | ICD-10-CM

## 2021-10-18 DIAGNOSIS — I10 Essential (primary) hypertension: Secondary | ICD-10-CM | POA: Diagnosis not present

## 2021-10-18 MED ORDER — AMLODIPINE BESYLATE 5 MG PO TABS: 5.0000 mg | ORAL_TABLET | Freq: Every evening | ORAL | 1 refills | Status: DC

## 2021-10-18 MED ORDER — ATORVASTATIN CALCIUM 40 MG PO TABS: ORAL_TABLET | Freq: Every day | ORAL | 3 refills | Status: DC

## 2021-10-18 NOTE — Addendum Note (Signed)
Addended by: Loel Dubonnet on: 10/18/2021 01:05 PM   Modules accepted: Orders

## 2021-10-18 NOTE — H&P (View-Only) (Signed)
Office Visit    Patient Name: Dean Morris Date of Encounter: 10/18/2021  PCP:  [name], [facility]  Cardiologist:  Buford Dresser, MD  Advanced Practice Provider:  No care team member to display Electrophysiologist:  None      Chief Complaint    Dean Morris is a 66 y.o. male with a hx of PAF (in 2018 in setting of acute illness, recurred 08/2021), CAD (OM stent 2013), HTN, HLD presents today for follow up of atrial fibrillation   Past Medical History    Past Medical History:  Diagnosis Date   Allergy    seasonal allergies   Anxiety    Arthritis    bilateral hands   Asthma    uses inhaler   CAD (coronary artery disease)    stent   Cholelithiasis    Chronic kidney disease    CKD-   Depression    GERD (gastroesophageal reflux disease)    not on meds at this time   Gout    Hyperlipidemia    on meds   Hypertension    not on meds at this time   Myocardial infarction [facility]) 2011   hx of   Paroxysmal A-fib ([place])    Past Surgical History:  Procedure Laterality Date   BLADDER REPAIR  1990's   unknown procedure   CORONARY ANGIOPLASTY WITH STENT PLACEMENT     TONSILLECTOMY      Allergies  No Known Allergies  History of Present Illness    Dean Morris is a 66 y.o. male with a hx of PAF (in 2018 in setting of acute illness, recurred 08/2021), CAD (OM stent 2013), HTN, HLD last seen 09/29/21.  He was seen 09/29/21 in atrial fibrillation with RVR. Eliquis had been started 08/31/21 without missed doses. He proceeded to trial medical management prior to cardioversion and Metoprolol increased to 100mg  BID. He was previously transitioned from Amlodipine to Losartan due to gout/elevated uric acid levels.   He presents today for follow up. 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 BP at home 120s/80s and heart rate 80s. Reports no chest pain, edema, orthopnea, PND,  lightheadedness, dizziness, palpitations.  Does note exertional dyspnea and wonders whether he needs an inhaler. We reviewed that this likely a symptom of atrial fibrillation. Discussed pursuing cardioversion given he has not converted with increased dose Metoprolol.   EKGs/Labs/Other Studies Reviewed:   The following studies were reviewed today:   Bilateral LE Venous DVT 07/27/2021: Summary:  RIGHT:  - There is no evidence of deep vein thrombosis in the lower extremity.  - No cystic structure found in the popliteal fossa.     LEFT:  - No evidence of common femoral vein obstruction.    Echo 04/10/20 1. Left ventricular ejection fraction, by estimation, is 60 to 65%. The  left ventricle has normal function. The left ventricle has no regional  wall motion abnormalities. There is severe left ventricular hypertrophy.  Left ventricular diastolic parameters   are indeterminate.   2. Right ventricular systolic function is normal. The right ventricular  size is normal. There is normal pulmonary artery systolic pressure.   3. The mitral valve is normal in structure. No evidence of mitral valve  regurgitation. No evidence of mitral stenosis.   4. The aortic valve is tricuspid. Aortic valve regurgitation is not  visualized. No aortic stenosis is present.   5. Aortic dilatation noted. There is  mild dilatation of the ascending  aorta measuring 40 mm.   6. The inferior vena cava is normal in size with greater than 50%  respiratory variability, suggesting right atrial pressure of 3 mmHg.     EKG:  EKG is ordered today.  The ekg ordered today demonstrates Rate controlled atrial fibrillation 95 bpm with left axis deviation and RBBB. No acute ST/T wave changes.  Recent Labs: 07/27/2021: Hemoglobin 14.4; Platelets 367 09/14/2021: BUN 24; Creatinine, Ser 1.13; Potassium 4.4; Sodium 144  Recent Lipid Panel    Component Value Date/Time   CHOL 132 08/31/2021 1505   TRIG 149 08/31/2021 1505   HDL 27  (L) 08/31/2021 1505   CHOLHDL 4.9 08/31/2021 1505   LDLCALC 79 08/31/2021 1505    Risk Assessment/Calculations:   CHA2DS2-VASc Score = 3  This indicates a 3.2% annual risk of stroke. The patient's score is based upon: CHF History: 0 HTN History: 1 Diabetes History: 0 Stroke History: 0 Vascular Disease History: 1 Age Score: 1 Gender Score: 0   Home Medications   Current Meds  Medication Sig   AMLODIPINE BESYLATE PO Take 5 mg by mouth at bedtime.   apixaban (ELIQUIS) 5 MG TABS tablet Take 1 tablet (5 mg total) by mouth 2 (two) times daily.   atorvastatin (LIPITOR) 40 MG tablet TAKE 1 TABLET (40 MG TOTAL) BY MOUTH DAILY.   diclofenac Sodium (VOLTAREN) 1 % GEL APPLY 2 GRAMS TOPICALLY DAILY AS NEEDED.   losartan (COZAAR) 50 MG tablet Take 1 tablet (50 mg total) by mouth at bedtime.   metoprolol tartrate (LOPRESSOR) 50 MG tablet Take 2 tablets (100 mg total) by mouth 2 (two) times daily.   nitroGLYCERIN (NITROSTAT) 0.4 MG SL tablet Place 1 tablet (0.4 mg total) under the tongue every 5 (five) minutes as needed for chest pain.     Review of Systems    All other systems reviewed and are otherwise negative except as noted above.  Physical Exam    VS:  BP 128/80    Pulse 95    Ht 5\' 6"  (1.676 m)    Wt 228 lb 4.8 oz (103.6 kg)    SpO2 97%    BMI 36.85 kg/m  , BMI Body mass index is 36.85 kg/m.  Wt Readings from Last 3 Encounters:  10/18/21 228 lb 4.8 oz (103.6 kg)  09/29/21 223 lb (101.2 kg)  09/28/21 221 lb 6.4 oz (100.4 kg)    GEN: Well nourished, overweight, well developed, in no acute distress. HEENT: normal. Neck: Supple, no JVD, carotid bruits, or masses. Cardiac: irregularly irregular, no murmurs, rubs, or gallops. No clubbing, cyanosis, edema.  Radials/PT 2+ and equal bilaterally.  Respiratory:  Respirations regular and unlabored, clear to auscultation bilaterally. GI: Soft, nontender, nondistended. MS: No deformity or atrophy. Skin: Warm and dry, no rash. Neuro:   Strength and sensation are intact. Psych: Normal affect.  Assessment & Plan    PAF / Hypercoagulable state due to anticoagulation - Previous episode 2018 with recurrence 08/2021. Has not converted with up titration of Metoprolol. Continue Eliquis 5mg  BID. Denies bleeding complications, missed doses. CHA2DS2-VASc Score = 3 [CHF History: 0, HTN History: 1, Diabetes History: 0, Stroke History: 0, Vascular Disease History: 1, Age Score: 1, Gender Score: 0].  Therefore, the patient's annual risk of stroke is 3.2 %.  Given symptomatic with exertional dyspnea, fatigue plan for cardioversion. CBC, BMP 1 week prior to cardioversion.   Shared Decision Making/Informed Consent{ The risks (stroke, cardiac arrhythmias rarely resulting  in the need for a temporary or permanent pacemaker, skin irritation or burns and complications associated with conscious sedation including aspiration, arrhythmia, respiratory failure and death), benefits (restoration of normal sinus rhythm) and alternatives of a direct current cardioversion were explained in detail to Dean Morris and he agrees to proceed.    CAD - EKG with no acute ST/T wave changes. Notes exertional dyspnea but no chest pain. Exertional dyspnea likely due to atrial fibrillation. GDMT includes Metoprolol, Atorvastatin. No aspirin due to chronic anticoagulation. Heart healthy diet and regular cardiovascular exercise encouraged.    HTN - BP well controlled. Continue current antihypertensive regimen including Amlodipine 5mg  QD, Losartan 50mg  QD. Refill of Amlodipine provided.   HLD - Continue Atorvastatin. Refill provided. LDL goal <70. 08/31/21 LDL 79. Lifestyle changes recommended.   Disposition: Follow up  in 5 weeks  with Buford Dresser, MD or APP.  Signed, Loel Dubonnet, NP 10/18/2021, 11:41 AM [place]

## 2021-10-18 NOTE — Progress Notes (Signed)
Office Visit    Patient Name: Dean Morris Date of Encounter: 10/18/2021  PCP:  Dean Morris, [facility]  Cardiologist:  Dean Dresser, MD  Advanced Practice Provider:  No care team member to display Electrophysiologist:  None      Chief Complaint    [name] is a 66 y.o. male with a hx of PAF (in 2018 in setting of acute illness, recurred 08/2021), CAD (OM stent 2013), HTN, HLD presents today for follow up of atrial fibrillation   Past Medical History    Past Medical History:  Diagnosis Date   Allergy    seasonal allergies   Anxiety    Arthritis    bilateral hands   Asthma    uses inhaler   CAD (coronary artery disease)    stent   Cholelithiasis    Chronic kidney disease    CKD-   Depression    GERD (gastroesophageal reflux disease)    not on meds at this time   Gout    Hyperlipidemia    on meds   Hypertension    not on meds at this time   Myocardial infarction [facility]) 2011   hx of   Paroxysmal A-fib ([place])    Past Surgical History:  Procedure Laterality Date   BLADDER REPAIR  1990's   unknown procedure   CORONARY ANGIOPLASTY WITH STENT PLACEMENT     TONSILLECTOMY      Allergies  No Known Allergies  History of Present Illness    Dean Morris is a 66 y.o. male with a hx of PAF (in 2018 in setting of acute illness, recurred 08/2021), CAD (OM stent 2013), HTN, HLD last seen 09/29/21.  He was seen 09/29/21 in atrial fibrillation with RVR. Eliquis had been started 08/31/21 without missed doses. He proceeded to trial medical management prior to cardioversion and Metoprolol increased to 100mg  BID. He was previously transitioned from Amlodipine to Losartan due to gout/elevated uric acid levels.   He presents today for follow up. 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 BP at home 120s/80s and heart rate 80s. Reports no chest pain, edema, orthopnea, PND,  lightheadedness, dizziness, palpitations.  Does note exertional dyspnea and wonders whether he needs an inhaler. We reviewed that this likely a symptom of atrial fibrillation. Discussed pursuing cardioversion given he has not converted with increased dose Metoprolol.   EKGs/Labs/Other Studies Reviewed:   The following studies were reviewed today:   Bilateral LE Venous DVT 07/27/2021: Summary:  RIGHT:  - There is no evidence of deep vein thrombosis in the lower extremity.  - No cystic structure found in the popliteal fossa.     LEFT:  - No evidence of common femoral vein obstruction.    Echo 04/10/20 1. Left ventricular ejection fraction, by estimation, is 60 to 65%. The  left ventricle has normal function. The left ventricle has no regional  wall motion abnormalities. There is severe left ventricular hypertrophy.  Left ventricular diastolic parameters   are indeterminate.   2. Right ventricular systolic function is normal. The right ventricular  size is normal. There is normal pulmonary artery systolic pressure.   3. The mitral valve is normal in structure. No evidence of mitral valve  regurgitation. No evidence of mitral stenosis.   4. The aortic valve is tricuspid. Aortic valve regurgitation is not  visualized. No aortic stenosis is present.   5. Aortic dilatation noted. There is  mild dilatation of the ascending  aorta measuring 40 mm.   6. The inferior vena cava is normal in size with greater than 50%  respiratory variability, suggesting right atrial pressure of 3 mmHg.     EKG:  EKG is ordered today.  The ekg ordered today demonstrates Rate controlled atrial fibrillation 95 bpm with left axis deviation and RBBB. No acute ST/T wave changes.  Recent Labs: 07/27/2021: Hemoglobin 14.4; Platelets 367 09/14/2021: BUN 24; Creatinine, Ser 1.13; Potassium 4.4; Sodium 144  Recent Lipid Panel    Component Value Date/Time   CHOL 132 08/31/2021 1505   TRIG 149 08/31/2021 1505   HDL 27  (L) 08/31/2021 1505   CHOLHDL 4.9 08/31/2021 1505   LDLCALC 79 08/31/2021 1505    Risk Assessment/Calculations:   CHA2DS2-VASc Score = 3  This indicates a 3.2% annual risk of stroke. The patient's score is based upon: CHF History: 0 HTN History: 1 Diabetes History: 0 Stroke History: 0 Vascular Disease History: 1 Age Score: 1 Gender Score: 0   Home Medications   Current Meds  Medication Sig   AMLODIPINE BESYLATE PO Take 5 mg by mouth at bedtime.   apixaban (ELIQUIS) 5 MG TABS tablet Take 1 tablet (5 mg total) by mouth 2 (two) times daily.   atorvastatin (LIPITOR) 40 MG tablet TAKE 1 TABLET (40 MG TOTAL) BY MOUTH DAILY.   diclofenac Sodium (VOLTAREN) 1 % GEL APPLY 2 GRAMS TOPICALLY DAILY AS NEEDED.   losartan (COZAAR) 50 MG tablet Take 1 tablet (50 mg total) by mouth at bedtime.   metoprolol tartrate (LOPRESSOR) 50 MG tablet Take 2 tablets (100 mg total) by mouth 2 (two) times daily.   nitroGLYCERIN (NITROSTAT) 0.4 MG SL tablet Place 1 tablet (0.4 mg total) under the tongue every 5 (five) minutes as needed for chest pain.     Review of Systems    All other systems reviewed and are otherwise negative except as noted above.  Physical Exam    VS:  BP 128/80    Pulse 95    Ht 5\' 6"  (1.676 m)    Wt 228 lb 4.8 oz (103.6 kg)    SpO2 97%    BMI 36.85 kg/m  , BMI Body mass index is 36.85 kg/m.  Wt Readings from Last 3 Encounters:  10/18/21 228 lb 4.8 oz (103.6 kg)  09/29/21 223 lb (101.2 kg)  09/28/21 221 lb 6.4 oz (100.4 kg)    GEN: Well nourished, overweight, well developed, in no acute distress. HEENT: normal. Neck: Supple, no JVD, carotid bruits, or masses. Cardiac: irregularly irregular, no murmurs, rubs, or gallops. No clubbing, cyanosis, edema.  Radials/PT 2+ and equal bilaterally.  Respiratory:  Respirations regular and unlabored, clear to auscultation bilaterally. GI: Soft, nontender, nondistended. MS: No deformity or atrophy. Skin: Warm and dry, no rash. Neuro:   Strength and sensation are intact. Psych: Normal affect.  Assessment & Plan    PAF / Hypercoagulable state due to anticoagulation - Previous episode 2018 with recurrence 08/2021. Has not converted with up titration of Metoprolol. Continue Eliquis 5mg  BID. Denies bleeding complications, missed doses. CHA2DS2-VASc Score = 3 [CHF History: 0, HTN History: 1, Diabetes History: 0, Stroke History: 0, Vascular Disease History: 1, Age Score: 1, Gender Score: 0].  Therefore, the patient's annual risk of stroke is 3.2 %.  Given symptomatic with exertional dyspnea, fatigue plan for cardioversion. CBC, BMP 1 week prior to cardioversion.   Shared Decision Making/Informed Consent{ The risks (stroke, cardiac arrhythmias rarely resulting  in the need for a temporary or permanent pacemaker, skin irritation or burns and complications associated with conscious sedation including aspiration, arrhythmia, respiratory failure and death), benefits (restoration of normal sinus rhythm) and alternatives of a direct current cardioversion were explained in detail to Mr. Doyle Askew and he agrees to proceed.    CAD - EKG with no acute ST/T wave changes. Notes exertional dyspnea but no chest pain. Exertional dyspnea likely due to atrial fibrillation. GDMT includes Metoprolol, Atorvastatin. No aspirin due to chronic anticoagulation. Heart healthy diet and regular cardiovascular exercise encouraged.    HTN - BP well controlled. Continue current antihypertensive regimen including Amlodipine 5mg  QD, Losartan 50mg  QD. Refill of Amlodipine provided.   HLD - Continue Atorvastatin. Refill provided. LDL goal <70. 08/31/21 LDL 79. Lifestyle changes recommended.   Disposition: Follow up  in 5 weeks  with Dean Dresser, MD or APP.  Signed, Loel Dubonnet, NP 10/18/2021, 11:41 AM [place]

## 2021-10-18 NOTE — Patient Instructions (Signed)
Medication Instructions:  Continue your current medications.   Be sure not to miss any doses of your Eliquis.   *If you need a refill on your cardiac medications before your next appointment, please call your pharmacy*  Lab Work: Your physician recommends that you return for lab work on 10/27/21 for BMP, CBC. You do not need to be fasting. You may come to the...   [facility] (3rd floor) [address], [place], [place] [address]  Open: 8am-Noon and 1pm-4:30pm   [facility] at [facility] [address]- Any location  **no appointments needed**   If you have labs (blood work) drawn today and your tests are completely normal, you will receive your results only by: [employer] (if you have MyChart) OR A paper copy in the mail If you have any lab test that is abnormal or we need to change your treatment, we will call you to review the results.  Testing/Procedures: Your EKG today showed atrial fibrillation. We are planning to do a cardioversion to get your heart back in normal rhythm. This is scheduled for 11/03/21.   Follow-Up: At [facility], you and your health needs are our priority.  As part of our continuing mission to provide you with exceptional heart care, we have created designated Provider Care Teams.  These Care Teams include your primary Cardiologist (physician) and Advanced Practice Providers (APPs -  Physician Assistants and Nurse Practitioners) who all work together to provide you with the care you need, when you need it.  We recommend signing up for the patient portal called "MyChart".  Sign up information is provided on this After Visit Summary.  MyChart is used to connect with patients for Virtual Visits (Telemedicine).  Patients are able to view lab/test results, encounter notes, upcoming appointments, etc.  Non-urgent messages can be sent to your provider as well.   To learn more about what you can do with  MyChart, go to [email or web address].    Your next appointment:   In 5 weeks  The format for your next appointment:   In Person  Provider:   Jodelle Red, MD or Alver Sorrow, NP     Other Instructions:  You are scheduled for a Cardioversion on 11/03/21 at 10am with Dr. Anne Fu.  Please arrive at the [facility] (Main Entrance A) at [facility]: [address] [place], [place] [address] at 9 am.   DIET: Nothing to eat or drink after midnight except a sip of water with medications (see medication instructions below)  FYI: For your safety, and to allow [place] to monitor your vital signs accurately during the surgery/procedure we request that if you have artificial nails, gel coating, SNS etc. Please have those removed prior to your surgery/procedure. Not having the nail coverings /polish removed may result in cancellation or delay of your surgery/procedure.   Medication Instructions: Continue your anticoagulant: Eliquis You will need to continue your anticoagulant after your procedure until you are told by your provider that it is safe to stop   Labs:   Come to: [facility] or [facility] for CBC, BMP on 10/27/21.  You must have a responsible person to drive you home and stay in the waiting area during your procedure. Failure to do so could result in cancellation.  Bring your insurance cards.  *Special Note: Every effort is made to have your procedure done on time. Occasionally there are emergencies that occur at the hospital that may  cause delays. Please be patient if a delay does occur.     Electrical Cardioversion Electrical cardioversion is the delivery of a jolt of electricity to restore a normal rhythm to the heart. A rhythm that is too fast or is not regular keeps the heart from pumping well. In this procedure, sticky patches or metal paddles are placed on the chest to deliver electricity to the heart from a device. This procedure may be done in an  emergency if: There is low or no blood pressure as a result of the heart rhythm. Normal rhythm must be restored as fast as possible to protect the brain and heart from further damage. It may save a life. This may also be a scheduled procedure for irregular or fast heart rhythms that are not immediately life-threatening. What happens before the procedure? Medicines Your health care provider may have you start taking: Blood-thinning medicines (anticoagulants) so your blood does not clot as easily. Medicines to help stabilize your heart rate and rhythm. Ask your health care provider about: Changing or stopping your regular medicines. This is especially important if you are taking diabetes medicines or blood thinners. Taking medicines such as aspirin and ibuprofen. These medicines can thin your blood. Do not take these medicines unless your health care provider tells you to take them. Taking over-the-counter medicines, vitamins, herbs, and supplements. What happens during the procedure?  An IV will be inserted into one of your veins. Sticky patches (electrodes) or metal paddles may be placed on your chest. You will be given a medicine to help you relax (sedative). An electrical shock will be delivered. The procedure may vary among health care providers and hospitals. What can I expect after the procedure? Your blood pressure, heart rate, breathing rate, and blood oxygen level will be monitored until you leave the hospital or clinic. Your heart rhythm will be watched to make sure it does not change. You may have some redness on the skin where the shocks were given. Follow these instructions at home: Do not drive for 24 hours if you were given a sedative during your procedure. Take over-the-counter and prescription medicines only as told by your health care provider. Ask your health care provider how to check your pulse. Check it often. Rest for 48 hours after the procedure or as told by your  health care provider. Avoid or limit your caffeine use as told by your health care provider. Keep all follow-up visits as told by your health care provider. This is important. Contact a health care provider if: You feel like your heart is beating too quickly or your pulse is not regular. You have a serious muscle cramp that does not go away. Get help right away if: You have discomfort in your chest. You are dizzy or you feel faint. You have trouble breathing or you are short of breath. Your speech is slurred. You have trouble moving an arm or leg on one side of your body. Your fingers or toes turn cold or blue. Summary Electrical cardioversion is the delivery of a jolt of electricity to restore a normal rhythm to the heart. This procedure may be done right away in an emergency or may be a scheduled procedure if the condition is not an emergency. Generally, this is a safe procedure. After the procedure, check your pulse often as told by your health care provider.

## 2021-10-26 ENCOUNTER — Encounter (HOSPITAL_COMMUNITY): Payer: Self-pay | Admitting: Cardiology

## 2021-10-30 DIAGNOSIS — I480 Paroxysmal atrial fibrillation: Secondary | ICD-10-CM | POA: Diagnosis not present

## 2021-10-30 DIAGNOSIS — D6859 Other primary thrombophilia: Secondary | ICD-10-CM | POA: Diagnosis not present

## 2021-10-30 LAB — CBC
Hematocrit: 46 % (ref 37.5–51.0)
Hemoglobin: 15.2 g/dL (ref 13.0–17.7)
MCH: 28 pg (ref 26.6–33.0)
MCHC: 33 g/dL (ref 31.5–35.7)
MCV: 85 fL (ref 79–97)
Platelets: 296 10*3/uL (ref 150–450)
RBC: 5.42 x10E6/uL (ref 4.14–5.80)
RDW: 14.5 % (ref 11.6–15.4)
WBC: 8.7 10*3/uL (ref 3.4–10.8)

## 2021-10-30 LAB — BASIC METABOLIC PANEL
BUN/Creatinine Ratio: 16 (ref 10–24)
BUN: 20 mg/dL (ref 8–27)
CO2: 20 mmol/L (ref 20–29)
Calcium: 9.7 mg/dL (ref 8.6–10.2)
Chloride: 105 mmol/L (ref 96–106)
Creatinine, Ser: 1.29 mg/dL — ABNORMAL HIGH (ref 0.76–1.27)
Glucose: 93 mg/dL (ref 70–99)
Potassium: 4.9 mmol/L (ref 3.5–5.2)
Sodium: 142 mmol/L (ref 134–144)
eGFR: 61 mL/min/{1.73_m2} (ref >59)

## 2021-10-31 ENCOUNTER — Telehealth (HOSPITAL_BASED_OUTPATIENT_CLINIC_OR_DEPARTMENT_OTHER): Payer: Self-pay

## 2021-10-31 NOTE — Telephone Encounter (Addendum)
Results called to patient who verbalizes understanding!    ----- Message from Alver Sorrow, NP sent at 10/31/2021 10:16 AM EST ----- CBC with no evidence of anemia nor infection.  Creatinine slightly increased likely due to mild dehydration. Recommend increasing fluid intake. Proceed with cardioversion as scheduled.

## 2021-11-03 ENCOUNTER — Encounter: Payer: Self-pay | Admitting: Family Medicine

## 2021-11-03 ENCOUNTER — Encounter (HOSPITAL_COMMUNITY)
Admission: RE | Disposition: A | Discharge status: Home / Self Care | Payer: Self-pay | Source: Home / Self Care | Attending: Cardiology

## 2021-11-03 ENCOUNTER — Ambulatory Visit (HOSPITAL_BASED_OUTPATIENT_CLINIC_OR_DEPARTMENT_OTHER): Payer: Medicare HMO | Admitting: Anesthesiology

## 2021-11-03 ENCOUNTER — Other Ambulatory Visit: Payer: Self-pay

## 2021-11-03 ENCOUNTER — Ambulatory Visit (HOSPITAL_COMMUNITY): Payer: Medicare HMO | Admitting: Anesthesiology

## 2021-11-03 ENCOUNTER — Ambulatory Visit (HOSPITAL_COMMUNITY)
Admission: RE | Admit: 2021-11-03 | Discharge: 2021-11-03 | Disposition: A | Discharge status: Home / Self Care | Payer: Medicare HMO | Attending: Cardiology | Admitting: Cardiology

## 2021-11-03 DIAGNOSIS — N189 Chronic kidney disease, unspecified: Secondary | ICD-10-CM | POA: Insufficient documentation

## 2021-11-03 DIAGNOSIS — I10 Essential (primary) hypertension: Secondary | ICD-10-CM

## 2021-11-03 DIAGNOSIS — I480 Paroxysmal atrial fibrillation: Secondary | ICD-10-CM | POA: Diagnosis not present

## 2021-11-03 DIAGNOSIS — I2510 Atherosclerotic heart disease of native coronary artery without angina pectoris: Secondary | ICD-10-CM

## 2021-11-03 DIAGNOSIS — I252 Old myocardial infarction: Secondary | ICD-10-CM | POA: Diagnosis not present

## 2021-11-03 DIAGNOSIS — I4891 Unspecified atrial fibrillation: Secondary | ICD-10-CM

## 2021-11-03 DIAGNOSIS — I129 Hypertensive chronic kidney disease with stage 1 through stage 4 chronic kidney disease, or unspecified chronic kidney disease: Secondary | ICD-10-CM | POA: Insufficient documentation

## 2021-11-03 DIAGNOSIS — Z7901 Long term (current) use of anticoagulants: Secondary | ICD-10-CM | POA: Insufficient documentation

## 2021-11-03 DIAGNOSIS — Z955 Presence of coronary angioplasty implant and graft: Secondary | ICD-10-CM | POA: Diagnosis not present

## 2021-11-03 DIAGNOSIS — E785 Hyperlipidemia, unspecified: Secondary | ICD-10-CM | POA: Insufficient documentation

## 2021-11-03 HISTORY — PX: CARDIOVERSION: SHX1299

## 2021-11-03 SURGERY — CARDIOVERSION
Anesthesia: General

## 2021-11-03 MED ORDER — LIDOCAINE 2% (20 MG/ML) 5 ML SYRINGE
INTRAMUSCULAR | Status: DC | PRN
  Administered 2021-11-03: 60 mg via INTRAVENOUS

## 2021-11-03 MED ORDER — SODIUM CHLORIDE 0.9 % IV SOLN: INTRAVENOUS | Status: DC

## 2021-11-03 MED ORDER — PROPOFOL 10 MG/ML IV BOLUS
INTRAVENOUS | Status: DC | PRN
  Administered 2021-11-03: 60 mg via INTRAVENOUS

## 2021-11-03 NOTE — CV Procedure (Signed)
° ° °  Electrical Cardioversion Procedure Note Cj Edgell [id number] [birth year]  Procedure: Electrical Cardioversion Indications:  Atrial Fibrillation  Time Out: Verified patient identification, verified procedure,medications/allergies/relevent history reviewed, required imaging and test results available.  Performed  Procedure Details  The patient was NPO after midnight. Anesthesia was administered at the beside  by Dr.Hatchett with 60mg  of propofol.  Cardioversion was performed with synchronized biphasic defibrillation via AP pads with 200 joules.  1 attempt(s) were performed.  The patient converted to normal sinus rhythm. The patient tolerated the procedure well   IMPRESSION:  Successful cardioversion of atrial fibrillation    11/03/2021, 9:34 AM

## 2021-11-03 NOTE — Discharge Instructions (Signed)

## 2021-11-03 NOTE — Transfer of Care (Signed)
Immediate Anesthesia Transfer of Care Note  Patient: Dean Morris  Procedure(s) Performed: CARDIOVERSION  Patient Location: PACU and Endoscopy Unit  Anesthesia Type:General  Level of Consciousness: drowsy  Airway & Oxygen Therapy: Patient Spontanous Breathing  Post-op Assessment: Report given to RN and Post -op Vital signs reviewed and stable  Post vital signs: Reviewed and stable  Last Vitals:  Vitals Value Taken Time  BP    Temp    Pulse    Resp    SpO2      Last Pain:  Vitals:   11/03/21 0840  PainSc: 0-No pain         Complications: No notable events documented.

## 2021-11-03 NOTE — Anesthesia Postprocedure Evaluation (Signed)
Anesthesia Post Note  Patient: Dean Morris  Procedure(s) Performed: CARDIOVERSION     Patient location during evaluation: Endoscopy Anesthesia Type: General Level of consciousness: awake Pain management: pain level controlled Vital Signs Assessment: post-procedure vital signs reviewed and stable Respiratory status: spontaneous breathing Cardiovascular status: stable Postop Assessment: no apparent nausea or vomiting Anesthetic complications: no   No notable events documented.  Last Vitals:  Vitals:   11/03/21 0939 11/03/21 0948  BP: 132/90 (!) 150/102  Pulse: 67 66  Resp: 13 12  Temp: (!) 36.2 C   SpO2: 99% 100%    Last Pain:  Vitals:   11/03/21 0948  TempSrc:   PainSc: 0-No pain                 Huston Foley

## 2021-11-03 NOTE — Anesthesia Preprocedure Evaluation (Signed)
Anesthesia Evaluation  Patient identified by MRN, date of birth, ID band Patient awake    Reviewed: Allergy & Precautions, NPO status , Patient's Chart, lab work & pertinent test results  Airway Mallampati: II       Dental no notable dental hx.    Pulmonary former smoker,    Pulmonary exam normal        Cardiovascular hypertension, Pt. on medications and Pt. on home beta blockers + CAD, + Past MI and + Cardiac Stents  Normal cardiovascular exam+ dysrhythmias Atrial Fibrillation      Neuro/Psych    GI/Hepatic Neg liver ROS,   Endo/Other    Renal/GU Renal InsufficiencyRenal disease     Musculoskeletal   Abdominal (+) + obese,   Peds  Hematology negative hematology ROS (+)   Anesthesia Other Findings   Reproductive/Obstetrics                             Anesthesia Physical Anesthesia Plan  ASA: 3  Anesthesia Plan: General   Post-op Pain Management:    Induction: Intravenous  PONV Risk Score and Plan: Propofol infusion and TIVA  Airway Management Planned: Natural Airway and Mask  Additional Equipment: None  Intra-op Plan:   Post-operative Plan:   Informed Consent: I have reviewed the patients History and Physical, chart, labs and discussed the procedure including the risks, benefits and alternatives for the proposed anesthesia with the patient or authorized representative who has indicated his/her understanding and acceptance.       Plan Discussed with: CRNA  Anesthesia Plan Comments:         Anesthesia Quick Evaluation

## 2021-11-03 NOTE — Interval H&P Note (Signed)
History and Physical Interval Note:  11/03/2021 9:25 AM  Dean Morris  has presented today for surgery, with the diagnosis of AFIB.  The various methods of treatment have been discussed with the patient and family. After consideration of risks, benefits and other options for treatment, the patient has consented to  Procedure(s): CARDIOVERSION (N/A) as a surgical intervention.  The patient's history has been reviewed, patient examined, no change in status, stable for surgery.  I have reviewed the patient's chart and labs.  Questions were answered to the patient's satisfaction.     [employer]

## 2021-11-05 ENCOUNTER — Encounter (HOSPITAL_COMMUNITY): Payer: Self-pay | Admitting: Cardiology

## 2021-11-07 ENCOUNTER — Ambulatory Visit: Payer: Medicare HMO | Admitting: Student

## 2021-11-23 ENCOUNTER — Other Ambulatory Visit: Payer: Self-pay | Admitting: Family Medicine

## 2021-11-24 ENCOUNTER — Other Ambulatory Visit: Payer: Self-pay | Admitting: Family Medicine

## 2021-11-24 ENCOUNTER — Encounter (HOSPITAL_BASED_OUTPATIENT_CLINIC_OR_DEPARTMENT_OTHER): Payer: Self-pay | Admitting: Family

## 2021-11-24 ENCOUNTER — Telehealth: Payer: Self-pay | Admitting: *Deleted

## 2021-11-24 ENCOUNTER — Other Ambulatory Visit: Payer: Self-pay

## 2021-11-24 ENCOUNTER — Ambulatory Visit (INDEPENDENT_AMBULATORY_CARE_PROVIDER_SITE_OTHER): Payer: Medicare HMO | Admitting: Family

## 2021-11-24 ENCOUNTER — Other Ambulatory Visit (HOSPITAL_BASED_OUTPATIENT_CLINIC_OR_DEPARTMENT_OTHER): Payer: Self-pay | Admitting: Family

## 2021-11-24 VITALS — BP 138/102 | HR 108 | Ht 66.0 in | Wt 232.2 lb

## 2021-11-24 DIAGNOSIS — I4510 Unspecified right bundle-branch block: Secondary | ICD-10-CM

## 2021-11-24 DIAGNOSIS — Z79899 Other long term (current) drug therapy: Secondary | ICD-10-CM | POA: Diagnosis not present

## 2021-11-24 DIAGNOSIS — D6859 Other primary thrombophilia: Secondary | ICD-10-CM | POA: Diagnosis not present

## 2021-11-24 DIAGNOSIS — I480 Paroxysmal atrial fibrillation: Secondary | ICD-10-CM | POA: Diagnosis not present

## 2021-11-24 DIAGNOSIS — R0683 Snoring: Secondary | ICD-10-CM

## 2021-11-24 DIAGNOSIS — I10 Essential (primary) hypertension: Secondary | ICD-10-CM

## 2021-11-24 DIAGNOSIS — E785 Hyperlipidemia, unspecified: Secondary | ICD-10-CM

## 2021-11-24 DIAGNOSIS — I25118 Atherosclerotic heart disease of native coronary artery with other forms of angina pectoris: Secondary | ICD-10-CM

## 2021-11-24 MED ORDER — METOPROLOL TARTRATE 100 MG PO TABS: 100.0000 mg | ORAL_TABLET | Freq: Two times a day (BID) | ORAL | 3 refills | Status: DC

## 2021-11-24 MED ORDER — AMIODARONE HCL 200 MG PO TABS: ORAL_TABLET | ORAL | 1 refills | Status: DC

## 2021-11-24 NOTE — Progress Notes (Signed)
? ?Office Visit  ?  ?Patient Name: Dean Morris ?Date of Encounter: 11/24/2021 ? ?PCP:  [name], DO ?  ?[place]  ?Cardiologist:  Buford Dresser, MD  ?Advanced Practice Provider:  No care team member to display ?Electrophysiologist:  None  ?   ? ?Chief Complaint  ?  ?Dean Morris is a 66 y.o. male with a hx of PAF (in 2018 in setting of acute illness, recurred 08/2021), CAD (OM stent 2013), HTN, HLD presents today for follow up of atrial fibrillation  ? ?Past Medical History  ?  ?Past Medical History:  ?Diagnosis Date  ? Allergy   ? seasonal allergies  ? Anxiety   ? Arthritis   ? bilateral hands  ? Asthma   ? uses inhaler  ? CAD (coronary artery disease)   ? stent  ? Cholelithiasis   ? Chronic kidney disease   ? CKD-  ? Depression   ? GERD (gastroesophageal reflux disease)   ? not on meds at this time  ? Gout   ? Hyperlipidemia   ? on meds  ? Hypertension   ? not on meds at this time  ? Myocardial infarction [facility]) 2011  ? hx of  ? Paroxysmal A-fib ([place])   ? ?Past Surgical History:  ?Procedure Laterality Date  ? BLADDER REPAIR  1990's  ? unknown procedure  ? CARDIOVERSION N/A 11/03/2021  ? Procedure: CARDIOVERSION;  Surgeon: Jerline Pain, MD;  Location: [facility] ENDOSCOPY;  Service: Cardiovascular;  Laterality: N/A;  ? CORONARY ANGIOPLASTY WITH STENT PLACEMENT    ? TONSILLECTOMY    ? ? ?Allergies ? ?No Known Allergies ? ?History of Present Illness  ?  ?Dean Morris is a 66 y.o. male with a hx of PAF (in 2018 in setting of acute illness, recurred 08/2021), CAD (OM stent 2013), HTN, HLD last seen 10/18/21 ? ?He was seen 09/29/21 in atrial fibrillation with RVR. Eliquis had been started 08/31/21 without missed doses. He proceeded to trial medical management prior to cardioversion and Metoprolol increased to 100mg  BID. At follow up 10/18/21 still in atrial fib and underwent cardioversion 11/03/21 with Dr. Marlou Porch.  ? ?He presents today for follow up. 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 EKG shows return of atrial fibrillation. Feels like he was in rhythm for one week as he had more energy and his breathing was improved.  Has not been checking BP or heart rate at home. Notes his weight has been going up but endorses eating more. No edema, orhtopnea, PND. Describes feeling cold on Eliquis and reassurance provided regarding recent CBC.  ? ?EKGs/Labs/Other Studies Reviewed:  ? ?The following studies were reviewed today: ?  ?Bilateral LE Venous DVT 07/27/2021: ?Summary:  ?RIGHT:  ?- There is no evidence of deep vein thrombosis in the lower extremity.  ?- No cystic structure found in the popliteal fossa.  ?   ?LEFT:  ?- No evidence of common femoral vein obstruction.  ?  ?Echo 04/10/20 ?1. Left ventricular ejection fraction, by estimation, is 60 to 65%. The  ?left ventricle has normal function. The left ventricle has no regional  ?wall motion abnormalities. There is severe left ventricular hypertrophy.  ?Left ventricular diastolic parameters  ? are indeterminate.  ? 2. Right ventricular systolic function is normal. The right ventricular  ?size is normal. There is normal pulmonary artery systolic pressure.  ? 3. The mitral valve is normal in structure. No evidence of mitral  valve  ?regurgitation. No evidence of mitral stenosis.  ? 4. The aortic valve is tricuspid. Aortic valve regurgitation is not  ?visualized. No aortic stenosis is present.  ? 5. Aortic dilatation noted. 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  ? 6. The inferior vena cava is normal in size with greater than 50%  ?respiratory variability, suggesting right atrial pressure of 3 mmHg.  ?  ?EKG:  EKG is ordered today.  The ekg ordered today demonstrates atrial fibrillation with RVR 108 bpm with stable RBBB. Atrial fibrillation has recurred since cardioversion.  ? ?Recent Labs: ?10/30/2021: BUN 20; Creatinine, Ser 1.29; Hemoglobin 15.2; Platelets 296; Potassium  4.9; Sodium 142  ?Recent Lipid Panel ?   ?Component Value Date/Time  ? CHOL 132 08/31/2021 1505  ? TRIG 149 08/31/2021 1505  ? HDL 27 (L) 08/31/2021 1505  ? CHOLHDL 4.9 08/31/2021 1505  ? [place] 79 08/31/2021 1505  ? ?Risk Assessment/Calculations:  ? ?CHA2DS2-VASc Score = 3  ?This indicates a 3.2% annual risk of stroke. ?The patient's score is based upon: ?CHF History: 0 ?HTN History: 1 ?Diabetes History: 0 ?Stroke History: 0 ?Vascular Disease History: 1 ?Age Score: 1 ?Gender Score: 0 ?  ?Home Medications  ? ?Current Meds  ?Medication Sig  ? acetaminophen (TYLENOL) 500 MG tablet Take 1,000 mg by mouth every 8 (eight) hours as needed for moderate pain.  ? amLODipine (NORVASC) 5 MG tablet Take 1 tablet (5 mg total) by mouth at bedtime.  ? apixaban (ELIQUIS) 5 MG TABS tablet Take 1 tablet (5 mg total) by mouth 2 (two) times daily.  ? atorvastatin (LIPITOR) 40 MG tablet TAKE 1 TABLET (40 MG TOTAL) BY MOUTH DAILY.  ? diclofenac Sodium (VOLTAREN) 1 % GEL APPLY 2 GRAMS TOPICALLY DAILY AS NEEDED.  ? ePHEDrine-guaiFENesin (PRIMATENE ASTHMA) 12.5-200 MG TABS Take 1 spray by mouth in the morning and at bedtime.  ? losartan (COZAAR) 50 MG tablet Take 1 tablet (50 mg total) by mouth at bedtime.  ? metoprolol tartrate (LOPRESSOR) 50 MG tablet Take 2 tablets (100 mg total) by mouth 2 (two) times daily.  ? nitroGLYCERIN (NITROSTAT) 0.4 MG SL tablet Place 1 tablet (0.4 mg total) under the tongue every 5 (five) minutes as needed for chest pain.  ? Saline (ARY NASAL MIST ALLERGY/SINUS) 2.65 % SOLN Place 1 spray into the nose daily as needed (allergies).  ?  ? ?Review of Systems  ?  ?All other systems reviewed and are otherwise negative except as noted above. ? ?Physical Exam  ?  ?VS:  BP (!) 138/102 (BP Location: Right Arm, Patient Position: Sitting, Cuff Size: Large)   Pulse (!) 108   Ht 5\' 6"  (1.676 m)   Wt 232 lb 3.2 oz (105.3 kg)   BMI 37.48 kg/m?  , BMI Body mass index is 37.48 kg/m?. ? ?Wt Readings from Last 3 Encounters:   ?11/24/21 232 lb 3.2 oz (105.3 kg)  ?11/03/21 228 lb (103.4 kg)  ?10/18/21 228 lb 4.8 oz (103.6 kg)  ? ?GEN: Well nourished, overweight, well developed, in no acute distress. ?HEENT: normal. ?Neck: Supple, no JVD, carotid bruits, or masses. ?Cardiac: irregularly irregular, no murmurs, rubs, or gallops. No clubbing, cyanosis, edema.  Radials/PT 2+ and equal bilaterally.  ?Respiratory:  Respirations regular and unlabored, clear to auscultation bilaterally. ?GI: Soft, nontender, nondistended. ?MS: No deformity or atrophy. ?Skin: Warm and dry, no rash. ?Neuro:  Strength and sensation are intact. ?Psych: Normal affect. ? ?Assessment & Plan  ?  ?  PAF / Hypercoagulable state due to anticoagulation / On Amiodarone therapy / Snores - Previous episode 2018 with recurrence 08/2021. DCCV 11/03/21 and maintained NSR for only one week with improvement in dyspnea, energy level. EKG today recurrent atrial fib. He does not drink alcohol nor excessive caffeine. No proarrhythmic medications.  ?CHA2DS2-VASc Score = 3 [CHF History: 0, HTN History: 1, Diabetes History: 0, Stroke History: 0, Vascular Disease History: 1, Age Score: 1, Gender Score: 0].  Therefore, the patient's annual risk of stroke is 3.2 %. Continue Eliquis 5mg  BID. Denies bleeding complications ?Continue Metoprolol Tartrate 100mg  BID, refill provided. ?Due to early recurrence atrial fib, start Amiodarone. 400mg  BID x 7 days, 200mg  BID x 7 days, 200mg  QD.  ?Sleep study and echocardiogram ordered. Rule out OSA as causative (STOP Bang 6). Echo to assess atria size and valvular function.  ?Labs today: BNP, TSH, CMP. TSH previously ordered but never collected. Collect today to rule out thyroid abnormality as causative of atrial fib and baseline TSH/ALT/AST at initiation of Amiodarone. BNP to rule out volume overload due to weight gain - no edema, orthopnea, PND.  ?  ?CAD - EKG with no acute ST/T wave changes. Notes exertional dyspnea but no chest pain. Exertional dyspnea  likely due to atrial fibrillation. GDMT includes Metoprolol, Atorvastatin. No aspirin due to chronic anticoagulation. Heart healthy diet and regular cardiovascular exercise encouraged.   ? ?HTN - BP mildly elevated

## 2021-11-24 NOTE — Telephone Encounter (Signed)
Patient notified of HST appointment details. °

## 2021-11-24 NOTE — Patient Instructions (Addendum)
Medication Instructions:  ?Your physician has recommended you make the following change in your medication:  ? ?CONTINUE Metoprolol 100mg  twice daily. Refill provided.  ? ?CONTINUE Eliquis 5mg  twice daily ? ?START Amiodarone ?*This is a medication to help get your heart back to regular rhythm* ? ?Take 400mg  twice daily for 1 week ?Then take 200mg  twice daily for 1 week ?Then take 200mg  once daily ? ?*If you need a refill on your cardiac medications before your next appointment, please call your pharmacy* ? ? ?Lab Work: ?Your physician recommends that you return for lab work today: BMP, BNP, thyroid panel ? ?Please go up to the third floor for labs.  ? ?If you have labs (blood work) drawn today and your tests are completely normal, you will receive your results only by: ?MyChart Message (if you have MyChart) OR ?A paper copy in the mail ?If you have any lab test that is abnormal or we need to change your treatment, we will call you to review the results. ? ? ?Testing/Procedures: ?Your EKG today shows you are back in atrial fibrillation.  ? ?Your physician has recommended that you have a sleep study. This test records several body functions during sleep, including: brain activity, eye movement, oxygen and carbon dioxide blood levels, heart rate and rhythm, breathing rate and rhythm, the flow of air through your mouth and nose, snoring, body muscle movements, and chest and belly movement.  ? ?Your physician has requested that you have an echocardiogram. Echocardiography is a painless test that uses sound waves to create images of your heart. It provides your doctor with information about the size and shape of your heart and how well your heart?s chambers and valves are working. This procedure takes approximately one hour. There are no restrictions for this procedure.  ? ? ?Follow-Up: ?At [facility], you and your health needs are our priority.  As part of our continuing mission to provide you with exceptional heart  care, we have created designated Provider Care Teams.  These Care Teams include your primary Cardiologist (physician) and Advanced Practice Providers (APPs -  Physician Assistants and Nurse Practitioners) who all work together to provide you with the care you need, when you need it. ? ?We recommend signing up for the patient portal called "MyChart".  Sign up information is provided on this After Visit Summary.  MyChart is used to connect with patients for Virtual Visits (Telemedicine).  Patients are able to view lab/test results, encounter notes, upcoming appointments, etc.  Non-urgent messages can be sent to your provider as well.   ?To learn more about what you can do with MyChart, go to [email or web address].   ? ?Your next appointment:   ?April 5th at 11AM with Dr. Harrell Gave ? ? ?Other Instructions ? ?Please keep a log of blood pressure and heart rate once per day.   ?

## 2021-11-27 ENCOUNTER — Other Ambulatory Visit: Payer: Self-pay | Admitting: Family Medicine

## 2021-11-27 ENCOUNTER — Telehealth: Payer: Self-pay | Admitting: Family

## 2021-11-27 ENCOUNTER — Telehealth (HOSPITAL_BASED_OUTPATIENT_CLINIC_OR_DEPARTMENT_OTHER): Payer: Self-pay

## 2021-11-27 DIAGNOSIS — I480 Paroxysmal atrial fibrillation: Secondary | ICD-10-CM

## 2021-11-27 LAB — COMPREHENSIVE METABOLIC PANEL
ALT: 20 IU/L (ref 0–44)
AST: 13 IU/L (ref 0–40)
Albumin/Globulin Ratio: 1.9 (ref 1.2–2.2)
Albumin: 4.6 g/dL (ref 3.8–4.8)
Alkaline Phosphatase: 74 IU/L (ref 44–121)
BUN/Creatinine Ratio: 18 (ref 10–24)
BUN: 24 mg/dL (ref 8–27)
Bilirubin Total: 0.5 mg/dL (ref 0.0–1.2)
CO2: 22 mmol/L (ref 20–29)
Calcium: 9.9 mg/dL (ref 8.6–10.2)
Chloride: 103 mmol/L (ref 96–106)
Creatinine, Ser: 1.31 mg/dL — ABNORMAL HIGH (ref 0.76–1.27)
Globulin, Total: 2.4 g/dL (ref 1.5–4.5)
Glucose: 92 mg/dL (ref 70–99)
Potassium: 5.1 mmol/L (ref 3.5–5.2)
Sodium: 143 mmol/L (ref 134–144)
Total Protein: 7 g/dL (ref 6.0–8.5)
eGFR: 60 mL/min/{1.73_m2} (ref >59)

## 2021-11-27 LAB — THYROID PANEL WITH TSH
Free Thyroxine Index: 1.8 (ref 1.2–4.9)
T3 Uptake Ratio: 27 % (ref 24–39)
T4, Total: 6.7 ug/dL (ref 4.5–12.0)
TSH: 2.29 u[IU]/mL (ref 0.450–4.500)

## 2021-11-27 LAB — BRAIN NATRIURETIC PEPTIDE: BNP: 289.5 pg/mL — ABNORMAL HIGH (ref 0.0–100.0)

## 2021-11-27 MED ORDER — METOPROLOL TARTRATE 100 MG PO TABS: 100.0000 mg | ORAL_TABLET | Freq: Two times a day (BID) | ORAL | 2 refills | Status: DC

## 2021-11-27 NOTE — Telephone Encounter (Signed)
? ? ?*  STAT* If patient is at the pharmacy, call can be transferred to refill team. ? ? ?1. Which medications need to be refilled? (please list name of each medication and dose if known) metoprolol tartrate (LOPRESSOR) 100 MG tablet ? ?2. Which pharmacy/location (including street and city if local pharmacy) is medication to be sent to? [id number] - [place], [place] - [address] AT [facility] [address] ? ?3. Do they need a 30 day or 90 day supply? 90 days ? ?Per pt, the pharmacy did not receive refill. He only have 1 pill left ? ?

## 2021-11-27 NOTE — Telephone Encounter (Signed)
Rx(s) sent to pharmacy electronically.  

## 2021-11-27 NOTE — Telephone Encounter (Addendum)
Results called to patient who verbalizes understanding! Patient denies worsening SHOB or swelling a this time and endorses understanding to call us if this worsens.  ? ? ? ?----- Message from Alver Sorrow, NP sent at 11/27/2021  3:01 PM EDT ----- ?Normal thyroid. BNP mildly elevated indicating excess fluid. Kidney function remains overall stable. Normal liver, electrolytes.  ? ?Recommend restricting to <2L fluid intake per day. If he notes worsening dyspnea or swelling, please educate patient to contact us and we may consider low dose diuretic. Continue current medications and follow up as scheduled.  ?

## 2021-11-30 ENCOUNTER — Other Ambulatory Visit (HOSPITAL_BASED_OUTPATIENT_CLINIC_OR_DEPARTMENT_OTHER): Payer: Medicare HMO

## 2021-11-30 NOTE — Progress Notes (Addendum)
?  SUBJECTIVE:  ? ?CHIEF COMPLAINT / HPI:  ? ?Paroxysmal Afib: Asking to discuss amiodarone dosage and why he needs to several more tests done. States his money is tight with all of the recent cardiology visits.  ? ?Gout: no active flares.  ? ?PERTINENT  PMH / PSH: HTN, HLD, PAF ? ?OBJECTIVE:  ?BP 140/84   Pulse 64   Ht 5\' 6"  (1.676 m)   Wt 235 lb (106.6 kg)   SpO2 99%   BMI 37.93 kg/m?  ? ?General: NAD, pleasant, able to participate in exam ?Cardiac: irregularly irregular, normal rate, no murmurs auscultated ?Respiratory: CTAB, normal effort, no wheezes, rales or rhonchi ? ?ASSESSMENT/PLAN:  ?Healthcare maintenance ?Cologuard ordered. ? ?Gout ?Several gout flares in the past, goal uric acid <6. Uric acid 7.6 on 08/31/21. Recheck uric acid today. Consider allopurinol if still elevated.  ? ?Paroxysmal A-fib (H[facility]) ?Rate controlled on metoprolol 100mg  BID. Also on Eliquis 5mg  BID and started on Amiodarone recently by cardiology. Discussed treatment regimen for Amiodarone in addition to endorsing recommendations by Cardiology for echo and sleep study. ?  ?Orders Placed This Encounter  ?Procedures  ? Uric Acid  ? Cologuard  ? ? ?Return in about 3 months (around 03/03/2022) for annual physical. ? , DO ?12/01/2021, 5:53 PM ?PGY-1, [facility] ? ?

## 2021-12-01 ENCOUNTER — Ambulatory Visit (INDEPENDENT_AMBULATORY_CARE_PROVIDER_SITE_OTHER): Payer: Medicare HMO | Admitting: Student

## 2021-12-01 ENCOUNTER — Telehealth: Payer: Self-pay | Admitting: Family

## 2021-12-01 ENCOUNTER — Other Ambulatory Visit: Payer: Self-pay

## 2021-12-01 ENCOUNTER — Encounter: Payer: Self-pay | Admitting: Student

## 2021-12-01 VITALS — BP 140/84 | HR 64 | Ht 66.0 in | Wt 235.0 lb

## 2021-12-01 DIAGNOSIS — M10071 Idiopathic gout, right ankle and foot: Secondary | ICD-10-CM | POA: Diagnosis not present

## 2021-12-01 DIAGNOSIS — I480 Paroxysmal atrial fibrillation: Secondary | ICD-10-CM | POA: Diagnosis not present

## 2021-12-01 DIAGNOSIS — Z0000 Encounter for general adult medical examination without abnormal findings: Secondary | ICD-10-CM | POA: Diagnosis not present

## 2021-12-01 NOTE — Assessment & Plan Note (Signed)
Cologuard ordered

## 2021-12-01 NOTE — Assessment & Plan Note (Signed)
Several gout flares in the past, goal uric acid <6. Uric acid 7.6 on 08/31/21. Recheck uric acid today. Consider allopurinol if still elevated.  ?

## 2021-12-01 NOTE — Patient Instructions (Signed)
It was great to see you today! Thank you for choosing [facility] for your primary care. Dean Morris was seen for gout. ? ?Today we addressed: ?I am glad your gout has not been flaring up.  We are checking some blood work and may start you on a medication to have your gout under control if needed. ?I would recommend you get an echo and sleep study as advised by cardiology. ?We have ordered Cologuard which will be sent to you for you to send back and have your stool analyzed for colon cancer. ? ? ?Orders Placed This Encounter  ?Procedures  ? Uric Acid  ? Cologuard  ? ?We are checking some labs today. If they are abnormal, I will call you. If they are normal, I will send you a MyChart message (if it is active) or a letter in the mail. If you do not hear about your labs in the next 2 weeks, please call the office. ? ?You should return to our clinic Return in about 3 months (around 03/03/2022) for annual physical.. ? ?I recommend that you always bring your medications to each appointment as this makes it easy to ensure you are on the correct medications and helps [place] not miss refills when you need them. ? ?Please arrive 15 minutes before your appointment to ensure smooth check in process.  We appreciate your efforts in making this happen. ? ?Take care and seek immediate care sooner if you develop any concerns.  ? ?Thank you for allowing me to participate in your care, ?[name], DO ?12/01/2021, 3:43 PM ?PGY-1, [facility] ? ? ?

## 2021-12-01 NOTE — Assessment & Plan Note (Signed)
Rate controlled on metoprolol 100mg  BID. Also on Eliquis 5mg  BID and started on Amiodarone recently by cardiology. Discussed treatment regimen for Amiodarone in addition to endorsing recommendations by Cardiology for echo and sleep study. ?

## 2021-12-01 NOTE — Telephone Encounter (Signed)
Pt is concerned about the Sleep Study. He said that he can't afford it and wants to know how important it is for him to have this test ?

## 2021-12-02 LAB — URIC ACID: Uric Acid: 7.7 mg/dL (ref 3.8–8.4)

## 2021-12-04 ENCOUNTER — Telehealth: Payer: Self-pay | Admitting: *Deleted

## 2021-12-04 NOTE — Telephone Encounter (Signed)
Patient called in concerned about the notice he received from [facility] sleep lab about charging him $200.00. he assumed that his insurance was not covering his HST and that he was going to have to pay $200.00 I explained to him the notice is just to inform him that if he does not come to his appointment and does not call at least 48 hours ahead of the scheduled appointment, he may be charged a fee. After explaining this to him he stills seemed to be confused about it. I explained it to him a second time. He states that he may not have a ride since. It was told to him by me that if he does not have a definite ride by April 26 he will need to think about rescheduling his appointment to possibly avid the $200 no show fee.  He seemed to understand it a little more, but not fully. ?

## 2021-12-08 ENCOUNTER — Telehealth: Payer: Self-pay | Admitting: Family

## 2021-12-08 ENCOUNTER — Telehealth: Payer: Self-pay

## 2021-12-08 NOTE — Telephone Encounter (Signed)
? ?  Telephone encounter was:  Successful.  ?12/08/2021 ?Name: Dean Morris MRN: [id number] DOB: 1956/07/01 ? ?Dean Morris is a 66 y.o. year old male who is a primary care patient of [name], DO . The community resource team was consulted for assistance with Transportation Needs  ? ?Care guide performed the following interventions:  Pt called advising Medicaid can not pick him up monday. I see a new insurance added on pt's chart and called for Transportation. Pt now has 48-one trips for appts with [employer]. Pt needs to call [phone] [employer]  3 days before appts and for return trips.Pt has been educated on this as well. ?4/3 11:40a pick up - Trip ID [id number] ?4/5 - Trip [id number] - pick up 9:25am ?Pt or clinic can call for return trip. Pt ID number is in media scanned on 09/29/2021. ? ? ?Follow Up Plan:  No further follow up planned at this time. The patient has been provided with needed resources. ? ?Hessie Knows ?Care Guide, Embedded Care Coordination ?[facility]  Care management  ?[place], [place] [address]  ?Main Phone: [phone]  E-mail: [email or web address]  ?Website: [email or web address] ? ? ? ?

## 2021-12-08 NOTE — Telephone Encounter (Signed)
? ?  Pt is calling and requesting to speak with Dean Morris, he said its regarding his sleep last night  ?

## 2021-12-08 NOTE — Telephone Encounter (Signed)
Call back to patient, he states he is having difficulty sleeping at night.  Writer noted pt has secured a ride for his ECHO on Monday December 11, 2021.  Pt states on Wednesday March 29th he had a episode, his heart felt "warm," and some chest discomfort.  He relates the pain to "eating too much."  He states all those medicines he takes are making him hungry.  Pt denied shortness of breath, nausea or radiating pain during this episode. He denies active chest pain during the call.  Noted pt is using a Primatene inhaler, he reports using regularly.  Primatene inhaler contains ephedra explained to pt this is an ingredient that can impair his sleep patterns.  Encouraged pt to speak with his PCP about switching his inhaler to something else.  He verbalized understanding and plans to call PCP.  Pt had no other concerns at this time. Georgana Curio MHA RN CCM ?

## 2021-12-11 ENCOUNTER — Telehealth (HOSPITAL_BASED_OUTPATIENT_CLINIC_OR_DEPARTMENT_OTHER): Payer: Self-pay

## 2021-12-11 ENCOUNTER — Ambulatory Visit (INDEPENDENT_AMBULATORY_CARE_PROVIDER_SITE_OTHER): Payer: Medicare HMO

## 2021-12-11 DIAGNOSIS — I10 Essential (primary) hypertension: Secondary | ICD-10-CM | POA: Diagnosis not present

## 2021-12-11 DIAGNOSIS — I480 Paroxysmal atrial fibrillation: Secondary | ICD-10-CM

## 2021-12-11 DIAGNOSIS — I25118 Atherosclerotic heart disease of native coronary artery with other forms of angina pectoris: Secondary | ICD-10-CM | POA: Diagnosis not present

## 2021-12-11 LAB — ECHOCARDIOGRAM COMPLETE
AR max vel: 2.26 cm2
AV Area VTI: 2.36 cm2
AV Area mean vel: 2.37 cm2
AV Mean grad: 4 mmHg
AV Peak grad: 10 mmHg
AV Vena cont: 0.2 cm
Ao pk vel: 1.58 m/s
Area-P 1/2: 2.72 cm2
S' Lateral: 2.23 cm

## 2021-12-11 NOTE — Telephone Encounter (Addendum)
Results called to patient who verbalizes understanding!  ? ? ? ?----- Message from Alver Sorrow, NP sent at 12/11/2021  4:37 PM EDT ----- ?Echocardiogram shows normal heart pumping function.  Moderate thickening of the heart muscle.  Trivial leaking of the aortic valve.  Mild dilation of aortic root 40 mm and ascending aorta 41 mm.  No significant valvular abnormalities that would cause atrial fibrillation.  Recommend repeat echo in 1 year for monitoring of aorta. Continue optimal BP control to help progression. ?

## 2021-12-12 ENCOUNTER — Telehealth: Payer: Self-pay | Admitting: Student

## 2021-12-12 MED ORDER — ALLOPURINOL 100 MG PO TABS
100.0000 mg | ORAL_TABLET | Freq: Every day | ORAL | 2 refills | Status: DC
Start: 2021-12-12 — End: 2022-02-15

## 2021-12-12 NOTE — Telephone Encounter (Signed)
Called in to pharmacy allopurinol 100 mg daily 30 tabs with 2 refills.  Uric acid goal less than 6. ?

## 2021-12-12 NOTE — Telephone Encounter (Signed)
Started on Allopurinol 100mg  daily for gout prophylaxis. Uric acid goal of <6 ?

## 2021-12-13 ENCOUNTER — Encounter (HOSPITAL_BASED_OUTPATIENT_CLINIC_OR_DEPARTMENT_OTHER): Payer: Self-pay | Admitting: Cardiology

## 2021-12-13 ENCOUNTER — Ambulatory Visit (INDEPENDENT_AMBULATORY_CARE_PROVIDER_SITE_OTHER): Payer: Medicare HMO | Admitting: Cardiology

## 2021-12-13 VITALS — BP 122/88 | HR 77 | Ht 66.0 in | Wt 236.8 lb

## 2021-12-13 DIAGNOSIS — E785 Hyperlipidemia, unspecified: Secondary | ICD-10-CM | POA: Diagnosis not present

## 2021-12-13 DIAGNOSIS — D6859 Other primary thrombophilia: Secondary | ICD-10-CM

## 2021-12-13 DIAGNOSIS — I5031 Acute diastolic (congestive) heart failure: Secondary | ICD-10-CM | POA: Diagnosis not present

## 2021-12-13 DIAGNOSIS — I2510 Atherosclerotic heart disease of native coronary artery without angina pectoris: Secondary | ICD-10-CM

## 2021-12-13 DIAGNOSIS — I25118 Atherosclerotic heart disease of native coronary artery with other forms of angina pectoris: Secondary | ICD-10-CM

## 2021-12-13 DIAGNOSIS — Z7189 Other specified counseling: Secondary | ICD-10-CM | POA: Diagnosis not present

## 2021-12-13 DIAGNOSIS — I10 Essential (primary) hypertension: Secondary | ICD-10-CM

## 2021-12-13 DIAGNOSIS — I480 Paroxysmal atrial fibrillation: Secondary | ICD-10-CM | POA: Diagnosis not present

## 2021-12-13 MED ORDER — FUROSEMIDE 40 MG PO TABS: 40.0000 mg | ORAL_TABLET | Freq: Every day | ORAL | 3 refills | Status: DC | PRN

## 2021-12-13 MED ORDER — AMIODARONE HCL 200 MG PO TABS: 200.0000 mg | ORAL_TABLET | Freq: Every day | ORAL | 3 refills | Status: DC

## 2021-12-13 NOTE — Progress Notes (Signed)
?Cardiology Office Note:   ? ?Date:  12/15/2021  ? ?ID:  Dean Morris, DOB 1956/01/20, MRN [id number] ? ?PCP:  [name], DO  ?Cardiologist:  Buford Dresser, MD ? ?Referring MD: [name], DO  ? ?CC: follow-up ? ?History of Present Illness:   ? ?Dean Morris is a 66 y.o. male with a hx of paroxysmal atrial fibrillation, coronary artery disease s/p stent in distant past, hypertension, hyperlipidemia who is seen for follow-up. I initially met him 08/17/2020 as a new consult at the request of [name], DO for the evaluation and management of atrial fibrillation, coronary artery disease. ? ?CV history: Last seen 10/09/2016 at [facility] cardiology. Noted to have OM stent in 2013. Was in NSR but noted prior history of afib, only on aspirin. Per Care Everywhere, had afib RVR initially diagnosed 09/2016 when he was very ill.  ? ?Today,  ?Overall, he is feeling ok. He is experiencing insomnia and sob from working too much in his home for 3 nights. On the first night his heart was "warm". When he feels claustrophobic, he has sob. He was also feeling chest pressure, but when he sat up the pressure went away. He describes the feeling as a "band-like tightness." ? ?The patient struggles to exercise due to pain in his knees. Also, his allergies are affecting him.  ? ?He complains of edema in his bilateral feet, which he states may be related to having too much salt. Additionally he notes having frequent nocturnal urination, up to 8 times a night. ?  ?He also requested a refill on medication.  ? ?He denies any palpitations. No lightheadedness, headaches, syncope, orthopnea, or PND.  ? ?Past Medical History:  ?Diagnosis Date  ? Allergy   ? seasonal allergies  ? Anxiety   ? Arthritis   ? bilateral hands  ? Asthma   ? uses inhaler  ? CAD (coronary artery disease)   ? stent  ? Cholelithiasis   ? Chronic kidney disease   ? CKD-  ? Depression   ? GERD (gastroesophageal reflux disease)   ? not on meds at this time  ? Gout   ?  Hyperlipidemia   ? on meds  ? Hypertension   ? not on meds at this time  ? Myocardial infarction [facility]) 2011  ? hx of  ? Paroxysmal A-fib ([place])   ? ? ?Past Surgical History:  ?Procedure Laterality Date  ? BLADDER REPAIR  1990's  ? unknown procedure  ? CARDIOVERSION N/A 11/03/2021  ? Procedure: CARDIOVERSION;  Surgeon: Jerline Pain, MD;  Location: [facility] ENDOSCOPY;  Service: Cardiovascular;  Laterality: N/A;  ? CORONARY ANGIOPLASTY WITH STENT PLACEMENT    ? TONSILLECTOMY    ? ? ?Current Medications: ?Current Outpatient Medications on File Prior to Visit  ?Medication Sig  ? amLODipine (NORVASC) 5 MG tablet Take 1 tablet (5 mg total) by mouth at bedtime.  ? apixaban (ELIQUIS) 5 MG TABS tablet Take 1 tablet (5 mg total) by mouth 2 (two) times daily.  ? atorvastatin (LIPITOR) 40 MG tablet TAKE 1 TABLET (40 MG TOTAL) BY MOUTH DAILY.  ? diclofenac Sodium (VOLTAREN) 1 % GEL APPLY 2 GRAMS TOPICALLY DAILY AS NEEDED.  ? ePHEDrine-guaiFENesin (PRIMATENE ASTHMA) 12.5-200 MG TABS Take 1 spray by mouth in the morning and at bedtime.  ? losartan (COZAAR) 50 MG tablet Take 1 tablet (50 mg total) by mouth at bedtime.  ? metoprolol tartrate (LOPRESSOR) 100 MG tablet Take 1 tablet (100 mg total) by mouth 2 (two)  times daily.  ? nitroGLYCERIN (NITROSTAT) 0.4 MG SL tablet Place 1 tablet (0.4 mg total) under the tongue every 5 (five) minutes as needed for chest pain.  ? Saline (ARY NASAL MIST ALLERGY/SINUS) 2.65 % SOLN Place 1 spray into the nose daily as needed (allergies).  ? acetaminophen (TYLENOL) 500 MG tablet Take 1,000 mg by mouth every 8 (eight) hours as needed for moderate pain.  ? [DISCONTINUED] allopurinol (ZYLOPRIM) 100 MG tablet Take 1 tablet (100 mg total) by mouth daily.  ? ?No current facility-administered medications on file prior to visit.  ?  ? ?Allergies:   Patient has no known allergies.  ? ?Social History  ? ?Tobacco Use  ? Smoking status: Former  ?  Packs/day: 0.10  ?  Years: 30.00  ?  Pack years: 3.00  ?  Types:  Cigarettes  ?  Quit date: 12/10/2019  ?  Years since quitting: 2.0  ? Smokeless tobacco: Former  ?  Quit date: 2021  ?Vaping Use  ? Vaping Use: Never used  ?Substance Use Topics  ? Alcohol use: Yes  ?  Comment: 2-3 x per month  ? Drug use: No  ? ? ?Family History: ?family history includes Colon cancer (age of onset: 56) in his maternal grandmother; Heart failure in his brother and mother. There is no history of Esophageal cancer, Rectal cancer, or Stomach cancer. ? ?ROS:   ?Please see the history of present illness. ?(+) Allergies ?(+) SOB ?(+) Chest Pressure ?(+) Bilateral LE edema in feet ?All other systems are reviewed and negative.  ? ? ?EKGs/Labs/Other Studies Reviewed:   ? ?The following studies were reviewed today: ? ?Echo 12/11/2021: ?IMPRESSIONS  ? ? 1. Left ventricular ejection fraction, by estimation, is 60 to 65%. The  ?left ventricle has normal function. The left ventricle has no regional  ?wall motion abnormalities. There is moderate left ventricular hypertrophy.  ?Left ventricular diastolic  ?parameters are indeterminate.  ? 2. Right ventricular systolic function is normal. The right ventricular  ?size is normal. There is normal pulmonary artery systolic pressure. The  ?estimated right ventricular systolic pressure is 16.1 mmHg.  ? 3. Left atrial size was mildly dilated.  ? 4. The mitral valve is normal in structure. No evidence of mitral valve  ?regurgitation. No evidence of mitral stenosis.  ? 5. The aortic valve is normal in structure. Aortic valve regurgitation is  ?trivial. No aortic stenosis is present.  ? 6. Aortic dilatation noted. There is mild dilatation of the aortic root,  ?measuring 40 mm. There is mild dilatation of the ascending aorta,  ?measuring 41 mm.  ? 7. The inferior vena cava is dilated in size with >50% respiratory  ?variability, suggesting right atrial pressure of 8 mmHg.  ? ?Comparison(s): No significant change from prior study. Prior images  ?reviewed side by side. EF 60%.   ? ?Bilateral LE Venous DVT 07/27/2021: ?Summary:  ?RIGHT:  ?- There is no evidence of deep vein thrombosis in the lower extremity.  ?- No cystic structure found in the popliteal fossa.  ?   ?LEFT:  ?- No evidence of common femoral vein obstruction.  ? ?Echo 04/10/20 ?1. Left ventricular ejection fraction, by estimation, is 60 to 65%. The  ?left ventricle has normal function. The left ventricle has no regional  ?wall motion abnormalities. There is severe left ventricular hypertrophy.  ?Left ventricular diastolic parameters  ? are indeterminate.  ? 2. Right ventricular systolic function is normal. The right ventricular  ?size is normal. There  is normal pulmonary artery systolic pressure.  ? 3. The mitral valve is normal in structure. No evidence of mitral valve  ?regurgitation. No evidence of mitral stenosis.  ? 4. The aortic valve is tricuspid. Aortic valve regurgitation is not  ?visualized. No aortic stenosis is present.  ? 5. Aortic dilatation noted. 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  ? 6. The inferior vena cava is normal in size with greater than 50%  ?respiratory variability, suggesting right atrial pressure of 3 mmHg.  ? ?EKG:  EKG is personally reviewed.   ?12/13/2021: afib at 77 bpm, RBBB ?09/29/2021: atrial fibrillation RVR at 118 bpm. IVCD ?08/17/2020: NSR at 75 bpm, IVCD, nonspecific ST-T wave pattern. Similar to ECG from 04/11/20. Of note, the ECG from 04/11/20 is labeled atrial fibrillation, but it is NSR with PVC ? ?Recent Labs: ?10/30/2021: Hemoglobin 15.2; Platelets 296 ?11/24/2021: ALT 20; BNP 289.5; BUN 24; Creatinine, Ser 1.31; Potassium 5.1; Sodium 143; TSH 2.290  ? ?Recent Lipid Panel ?   ?Component Value Date/Time  ? CHOL 132 08/31/2021 1505  ? TRIG 149 08/31/2021 1505  ? HDL 27 (L) 08/31/2021 1505  ? CHOLHDL 4.9 08/31/2021 1505  ? [place] 79 08/31/2021 1505  ? ? ?Physical Exam:   ? ?VS:  BP 122/88   Pulse 77   Ht 5' 6" (1.676 m)   Wt 236 lb 12.8 oz (107.4 kg)   SpO2 97%    BMI 38.22 kg/m?    ? ?Wt Readings from Last 3 Encounters:  ?12/13/21 236 lb 12.8 oz (107.4 kg)  ?12/01/21 235 lb (106.6 kg)  ?11/24/21 232 lb 3.2 oz (105.3 kg)  ?  ?GEN: Well nourished, well developed in no acute

## 2021-12-13 NOTE — Patient Instructions (Signed)
Medication Instructions:  ?Take 40 mg of furosemide (lasix) once a day for three days. After that, take as needed for worsening swelling/shortness of breath or weight gain of 5 lbs in a week.  ? ?*If you need a refill on your cardiac medications before your next appointment, please call your pharmacy* ? ? ?Lab Work: ?None ordered today ? ? ?Testing/Procedures: ?None ordered today ? ? ?Follow-Up: ?At [facility], you and your health needs are our priority.  As part of our continuing mission to provide you with exceptional heart care, we have created designated Provider Care Teams.  These Care Teams include your primary Cardiologist (physician) and Advanced Practice Providers (APPs -  Physician Assistants and Nurse Practitioners) who all work together to provide you with the care you need, when you need it. ? ?We recommend signing up for the patient portal called "MyChart".  Sign up information is provided on this After Visit Summary.  MyChart is used to connect with patients for Virtual Visits (Telemedicine).  Patients are able to view lab/test results, encounter notes, upcoming appointments, etc.  Non-urgent messages can be sent to your provider as well.   ?To learn more about what you can do with MyChart, go to [email or web address].   ? ?Your next appointment:   ?2 month(s) ? ?The format for your next appointment:   ?In Person ? ?Provider:   ?Buford Dresser, MD{ ? ?Do the following things EVERY DAY: ? ?Weigh yourself EVERY morning after you go to the bathroom but before you eat or drink anything. Write this number down in a weight log/diary. If you gain 3 pounds overnight or 5 pounds in a week, call the office. ? ?Take your medicines as prescribed. If you have concerns about your medications, please call us before you stop taking them.  ? ?Eat low salt foods--Limit salt (sodium) to 2000 mg per day. This will help prevent your body from holding onto fluid. Read food labels as many processed foods have a  lot of sodium, especially canned goods and prepackaged meats. If you would like some assistance choosing low sodium foods, we would be happy to set you up with a nutritionist. ? ?Stay as active as you can everyday. Staying active will give you more energy and make your muscles stronger. Start with 5 minutes at a time and work your way up to 30 minutes a day. Break up your activities--do some in the morning and some in the afternoon. Start with 3 days per week and work your way up to 5 days as you can.  If you have chest pain, feel short of breath, dizzy, or lightheaded, STOP. If you don't feel better after a short rest, call 911. If you do feel better, call the office to let us know you have symptoms with exercise. ? ?Limit all fluids for the day to less than 2 liters. Fluid includes all drinks, coffee, juice, ice chips, soup, jello, and all other liquids.  ? ?

## 2021-12-14 DIAGNOSIS — Z1211 Encounter for screening for malignant neoplasm of colon: Secondary | ICD-10-CM | POA: Diagnosis not present

## 2021-12-15 ENCOUNTER — Encounter (HOSPITAL_BASED_OUTPATIENT_CLINIC_OR_DEPARTMENT_OTHER): Payer: Self-pay | Admitting: Cardiology

## 2021-12-21 LAB — COLOGUARD: COLOGUARD: NEGATIVE

## 2021-12-22 ENCOUNTER — Encounter: Payer: Self-pay | Admitting: Student

## 2022-01-05 ENCOUNTER — Telehealth: Payer: Self-pay | Admitting: Student

## 2022-01-05 ENCOUNTER — Encounter (HOSPITAL_BASED_OUTPATIENT_CLINIC_OR_DEPARTMENT_OTHER): Payer: Medicare HMO | Admitting: Cardiovascular Disease

## 2022-01-05 NOTE — Telephone Encounter (Signed)
Patient is calling to return two missed calls from our number. He said he is not sure who called and I do not see anything documented. I reminded him of future appointments at other offices. He still thinks it was someone from our office.  ? ?He would like for someone to call him back if it was someone from our office. The best call back is [phone]. ?

## 2022-01-05 NOTE — Telephone Encounter (Signed)
Can we call him to find out how much this test is for him? He can also call our billing department for additional info about payment plans. If cost prohibitive, okay to delay while we working on getting normal sinus rhythm restored. Follow up as scheduled.  ? ?Dean Sorrow, NP  ?

## 2022-01-05 NOTE — Telephone Encounter (Signed)
Will route to PCP to see if he has tried to reach pt.Benedict Kue Zimmerman Rumple, CMA ? ?

## 2022-01-05 NOTE — Telephone Encounter (Signed)
Call attempt, no answer, unable to leave message  ? ? ? ?"Can we call him to find out how much this test is for him? He can also call our billing department for additional info about payment plans. If cost prohibitive, okay to delay while we working on getting normal sinus rhythm restored. Follow up as scheduled.  ?  ?Loel Dubonnet, NP"  ?

## 2022-01-08 ENCOUNTER — Encounter (HOSPITAL_BASED_OUTPATIENT_CLINIC_OR_DEPARTMENT_OTHER): Payer: Medicare HMO | Admitting: Cardiovascular Disease

## 2022-01-08 DIAGNOSIS — I4891 Unspecified atrial fibrillation: Secondary | ICD-10-CM | POA: Diagnosis not present

## 2022-01-08 DIAGNOSIS — M109 Gout, unspecified: Secondary | ICD-10-CM | POA: Diagnosis not present

## 2022-01-08 DIAGNOSIS — Z008 Encounter for other general examination: Secondary | ICD-10-CM | POA: Diagnosis not present

## 2022-01-08 DIAGNOSIS — Z604 Social exclusion and rejection: Secondary | ICD-10-CM | POA: Diagnosis not present

## 2022-01-08 DIAGNOSIS — I110 Hypertensive heart disease with heart failure: Secondary | ICD-10-CM | POA: Diagnosis not present

## 2022-01-08 DIAGNOSIS — Z6838 Body mass index (BMI) 38.0-38.9, adult: Secondary | ICD-10-CM | POA: Diagnosis not present

## 2022-01-08 DIAGNOSIS — I509 Heart failure, unspecified: Secondary | ICD-10-CM | POA: Diagnosis not present

## 2022-01-08 DIAGNOSIS — H269 Unspecified cataract: Secondary | ICD-10-CM | POA: Diagnosis not present

## 2022-01-08 DIAGNOSIS — E785 Hyperlipidemia, unspecified: Secondary | ICD-10-CM | POA: Diagnosis not present

## 2022-01-08 DIAGNOSIS — I2510 Atherosclerotic heart disease of native coronary artery without angina pectoris: Secondary | ICD-10-CM | POA: Diagnosis not present

## 2022-01-08 DIAGNOSIS — D6869 Other thrombophilia: Secondary | ICD-10-CM | POA: Diagnosis not present

## 2022-01-08 DIAGNOSIS — I252 Old myocardial infarction: Secondary | ICD-10-CM | POA: Diagnosis not present

## 2022-01-08 NOTE — Telephone Encounter (Signed)
Left message for patient to call back ? ? ? ?"Can we call him to find out how much this test is for him? He can also call our billing department for additional info about payment plans. If cost prohibitive, okay to delay while we working on getting normal sinus rhythm restored. Follow up as scheduled.  ?  ?Loel Dubonnet, NP"  ?

## 2022-01-08 NOTE — Telephone Encounter (Signed)
Left message for patient to call back ?  ?  ?  ?"Can we call him to find out how much this test is for him? He can also call our billing department for additional info about payment plans. If cost prohibitive, okay to delay while we working on getting normal sinus rhythm restored. Follow up as scheduled.  ?  ?Dean Dubonnet, NP"  ?

## 2022-01-09 ENCOUNTER — Encounter (HOSPITAL_BASED_OUTPATIENT_CLINIC_OR_DEPARTMENT_OTHER): Payer: Medicare HMO | Admitting: Cardiovascular Disease

## 2022-01-11 ENCOUNTER — Ambulatory Visit (HOSPITAL_BASED_OUTPATIENT_CLINIC_OR_DEPARTMENT_OTHER): Payer: Medicare HMO | Attending: Family | Admitting: Cardiovascular Disease

## 2022-01-11 DIAGNOSIS — I480 Paroxysmal atrial fibrillation: Secondary | ICD-10-CM | POA: Diagnosis not present

## 2022-01-11 DIAGNOSIS — G4733 Obstructive sleep apnea (adult) (pediatric): Secondary | ICD-10-CM

## 2022-01-11 DIAGNOSIS — I2510 Atherosclerotic heart disease of native coronary artery without angina pectoris: Secondary | ICD-10-CM

## 2022-01-11 DIAGNOSIS — G4736 Sleep related hypoventilation in conditions classified elsewhere: Secondary | ICD-10-CM | POA: Diagnosis not present

## 2022-01-11 DIAGNOSIS — I10 Essential (primary) hypertension: Secondary | ICD-10-CM | POA: Diagnosis not present

## 2022-01-20 ENCOUNTER — Encounter (HOSPITAL_BASED_OUTPATIENT_CLINIC_OR_DEPARTMENT_OTHER): Payer: Self-pay | Admitting: Cardiovascular Disease

## 2022-01-20 NOTE — Procedures (Signed)
? ? ? ?  Patient Name: Dean Morris, Dean Morris ?Study Date: 01/11/2022 ?Gender: Male ?D.O.B: [birth year] ?Age (years): 20 ?Referring Provider: Alver Sorrow NP ?Height (inches): 66 ?Interpreting Physician: Nicki Guadalajara MD, ABSM ?Weight (lbs): 230 ?RPSGT:  Sink ?BMI: 37 ?MRN: [id number] ?Neck Size: 17.00 ? ?CLINICAL INFORMATION ?Sleep Study Type: HST ? ?Indication for sleep study: Snoring, PAF, obesity, HTN, CAD ? ?Epworth Sleepiness Score: 4 ? ?SLEEP STUDY TECHNIQUE ?A multi-channel overnight portable sleep study was performed. The channels recorded were: nasal airflow, thoracic respiratory movement, and oxygen saturation with a pulse oximetry. Snoring was also monitored. ? ?MEDICATIONS ?acetaminophen (TYLENOL) 500 MG tablet ?amiodarone (PACERONE) 200 MG tablet ?amLODipine (NORVASC) 5 MG tablet ?apixaban (ELIQUIS) 5 MG TABS tablet ?atorvastatin (LIPITOR) 40 MG tablet ?diclofenac Sodium (VOLTAREN) 1 % GEL ?ePHEDrine-guaiFENesin (PRIMATENE ASTHMA) 12.5-200 MG TABS ?furosemide (LASIX) 40 MG tablet ?losartan (COZAAR) 50 MG tablet ?metoprolol tartrate (LOPRESSOR) 100 MG tablet ?nitroGLYCERIN (NITROSTAT) 0.4 MG SL tablet ?Saline (ARY NASAL MIST ALLERGY/SINUS) 2.65 % SOLN ?Patient self administered medications include: N/A. ? ?SLEEP ARCHITECTURE ?Patient was studied for 364.5 minutes. The sleep efficiency was 100.0 % and the patient was supine for 62.4%. The arousal index was 0.0 per hour. ? ?RESPIRATORY PARAMETERS ?The overall AHI was 61.6 per hour, with a central apnea index of 0 per hour. Sleep apnea was worse with supine sleep (AHI 67/h) versus non-supine slep (AHI 52.6/h). ? ?The oxygen nadir was 79% during sleep. Time spent < 89% was 6.1 minutes. ? ?CARDIAC DATA ?Mean heart rate during sleep was 57.8 bpm. Heart rate range was 47 - 78 bpm. ? ?IMPRESSIONS ?- Severe obstructive sleep apnea occurred during this study (AHI = 61.6/h). ?- Severe oxygen desaturation to a nadir of 79%. ?- Patient snored 70.7 minutes  (19.4%) during the sleep. ? ?DIAGNOSIS ?- Obstructive Sleep Apnea (G47.33) ?- Nocturnal Hypoxemia (G47.36) ? ?RECOMMENDATIONS ?- In this patient with cardiovascular comorbidities, severe sleep apnea and significant oxygen desaturation recommend an in-lab CPAP titration evaluation. If unable for an in-lab study, initiate Autpo- PAP with EPR of 3 at 7 - 20 cm of water. ?- Effort should be made to optimize nasal and oropharyngeal patency. ?- Positional therapy avoiding supine position during sleep. ?- Avoid alcohol, sedatives and other CNS depressants that may worsen sleep apnea and disrupt normal sleep architecture. ?- Sleep hygiene should be reviewed to assess factors that may improve sleep quality. ?- Weight management (BMI 37) and regular exercise should be initiated or continued. ?- Recommend a download and sleep clinic evaluation after one month of therapy.  ? ? ?[Electronically signed] 01/20/2022 09:18 AM ? ?Nicki Guadalajara MD, [facility], ABSM ?Diplomate, Biomedical engineer of Sleep Medicine ? ?NPI: [id number] ? ?[facility] ?PH: [phone]   FX: [phone] ?ACCREDITED BY THE AMERICAN ACADEMY OF SLEEP MEDICINE ? ?

## 2022-01-29 ENCOUNTER — Telehealth (HOSPITAL_BASED_OUTPATIENT_CLINIC_OR_DEPARTMENT_OTHER): Payer: Self-pay | Admitting: Cardiology

## 2022-01-29 NOTE — Telephone Encounter (Signed)
Please advise, I see sleep study results but it does not appear to have been released to patient yet

## 2022-01-29 NOTE — Telephone Encounter (Signed)
Patient calling in regarding his sleep study. Please advise

## 2022-01-30 ENCOUNTER — Telehealth: Payer: Self-pay | Admitting: *Deleted

## 2022-01-30 ENCOUNTER — Other Ambulatory Visit: Payer: Self-pay | Admitting: Cardiovascular Disease

## 2022-01-30 DIAGNOSIS — G4736 Sleep related hypoventilation in conditions classified elsewhere: Secondary | ICD-10-CM

## 2022-01-30 DIAGNOSIS — G4733 Obstructive sleep apnea (adult) (pediatric): Secondary | ICD-10-CM

## 2022-01-30 NOTE — Telephone Encounter (Signed)
-----   Message from Troy Sine, MD sent at 01/20/2022  9:23 AM EDT ----- Mariann Laster, please  notify pt of results and try for in-lab titration; if unable then Auto-PAP

## 2022-01-30 NOTE — Telephone Encounter (Signed)
Patient notified of HST results and recommendations. All questions were answered satisfactorily.

## 2022-02-01 NOTE — Telephone Encounter (Signed)
Jan 30, 2022 Dean Morris, [facility]   [facility]   12:45 PM Note Patient notified of HST results and recommendations. All questions were answered satisfactorily.

## 2022-02-07 ENCOUNTER — Telehealth: Payer: Self-pay | Admitting: *Deleted

## 2022-02-07 NOTE — Telephone Encounter (Signed)
Prior Authorization for CPAP titration sent to [facility] via web portal.. received a phone call with approval info. Auth # [id number]. Valid dates 02/07/22 to 08/06/22.

## 2022-02-14 ENCOUNTER — Ambulatory Visit: Payer: Medicare HMO | Admitting: Student

## 2022-02-14 NOTE — Progress Notes (Deleted)
  SUBJECTIVE:   CHIEF COMPLAINT / HPI:   ***  PERTINENT  PMH / PSH: ***  Past Medical History:  Diagnosis Date   Allergy    seasonal allergies   Anxiety    Arthritis    bilateral hands   Asthma    uses inhaler   CAD (coronary artery disease)    stent   Cholelithiasis    Chronic kidney disease    CKD-   Depression    GERD (gastroesophageal reflux disease)    not on meds at this time   Gout    Hyperlipidemia    on meds   Hypertension    not on meds at this time   Myocardial infarction [facility]) 2011   hx of   Paroxysmal A-fib ([facility])     OBJECTIVE:  There were no vitals taken for this visit.  General: NAD, pleasant, able to participate in exam Cardiac: RRR, no murmurs auscultated. Respiratory: CTAB, normal effort, no wheezes, rales or rhonchi Abdomen: soft, non-tender, non-distended, normoactive bowel sounds Extremities: warm and well perfused, no edema or cyanosis. Skin: warm and dry, no rashes noted Neuro: alert, no obvious focal deficits, speech normal Psych: Normal affect and mood  ASSESSMENT/PLAN:  No problem-specific Assessment & Plan notes found for this encounter.   No orders of the defined types were placed in this encounter.  No orders of the defined types were placed in this encounter.  No follow-ups on file. [name], DO 02/14/2022, 8:14 AM PGY-***, [facility] {    This will disappear when note is signed, click to select method of visit    :1}

## 2022-02-15 ENCOUNTER — Ambulatory Visit (INDEPENDENT_AMBULATORY_CARE_PROVIDER_SITE_OTHER): Payer: Medicare HMO | Admitting: Student

## 2022-02-15 ENCOUNTER — Encounter: Payer: Self-pay | Admitting: Student

## 2022-02-15 VITALS — BP 142/84 | HR 58 | Ht 66.0 in | Wt 243.0 lb

## 2022-02-15 DIAGNOSIS — T7840XS Allergy, unspecified, sequela: Secondary | ICD-10-CM | POA: Diagnosis not present

## 2022-02-15 DIAGNOSIS — T7840XA Allergy, unspecified, initial encounter: Secondary | ICD-10-CM | POA: Insufficient documentation

## 2022-02-15 DIAGNOSIS — I10 Essential (primary) hypertension: Secondary | ICD-10-CM

## 2022-02-15 DIAGNOSIS — G479 Sleep disorder, unspecified: Secondary | ICD-10-CM | POA: Diagnosis not present

## 2022-02-15 MED ORDER — LORATADINE 10 MG PO TABS: 10.0000 mg | ORAL_TABLET | Freq: Every day | ORAL | 11 refills | Status: DC

## 2022-02-15 MED ORDER — ALLOPURINOL 100 MG PO TABS: 100.0000 mg | ORAL_TABLET | Freq: Every day | ORAL | 2 refills | Status: DC

## 2022-02-15 MED ORDER — AMLODIPINE BESYLATE 5 MG PO TABS: 5.0000 mg | ORAL_TABLET | Freq: Every evening | ORAL | 1 refills | Status: DC

## 2022-02-15 MED ORDER — FLUTICASONE PROPIONATE 50 MCG/ACT NA SUSP: 2.0000 | Freq: Every day | NASAL | 6 refills | Status: DC

## 2022-02-15 NOTE — Assessment & Plan Note (Signed)
Rx sent for Flonase and Claritin for allergy symptoms. Discussed appropriate use of medications and continuing to use nasal mist for clearance of pollen.

## 2022-02-15 NOTE — Progress Notes (Signed)
  SUBJECTIVE:   CHIEF COMPLAINT / HPI:   Sleeping difficulty: Pt presents today stating he is being woken up at night with difficulty breathing and a dry mouth. Several nighttime awakenings in this manner. He currently sleeps using 1 pillow on his couch. He feels his allergies have worsened which may be contributing to his dry mouth. He notes that he is able to breathe and do his daily tasks normally during the day. Does not feel that he has had increased swelling of his legs. He has a sleep study scheduled on 6/14 and appointment with Cardiology on 02/26/22.  PERTINENT  PMH / PSH: HTN, paroxysmal A.fib, gout, GERD, CAD, HLD  OBJECTIVE:  BP (!) 142/84   Pulse (!) 58   Ht 5\' 6"  (1.676 m)   Wt 243 lb (110.2 kg)   SpO2 98%   BMI 39.22 kg/m   General: NAD, pleasant, able to participate in exam Cardiac: RRR, no murmurs auscultated Respiratory: CTAB, normal effort, no wheezes, rales or rhonchi Abdomen: soft, non-tender, obese abdomen, normoactive bowel sounds Extremities: trace pitting edema of BLEs Psych: Normal affect and mood  ASSESSMENT/PLAN:  Sleeping difficulty Consistent sleep difficulty which does not appear to be cardiac related given clinical history. Mr. Houdeshell has been long awaiting diagnosis of sleep apnea and establishment with CPAP. History strongly suggests nighttime awakenings from sleep apnea. Echo on 12/11/21 suggests appropriate LVEF and he is being frequently monitored by Cardiology. He does not appear fluid overloaded although does have weight gain. Advised addressing weight gain after completing sleep study and most recent cardiology visit.   Allergies Rx sent for Flonase and Claritin for allergy symptoms. Discussed appropriate use of medications and continuing to use nasal mist for clearance of pollen.   Meds ordered this encounter  Medications   allopurinol (ZYLOPRIM) 100 MG tablet    Sig: Take 1 tablet (100 mg total) by mouth daily.    Dispense:  30 tablet     Refill:  2   loratadine (CLARITIN) 10 MG tablet    Sig: Take 1 tablet (10 mg total) by mouth daily.    Dispense:  30 tablet    Refill:  11   fluticasone (FLONASE) 50 MCG/ACT nasal spray    Sig: Place 2 sprays into both nostrils daily.    Dispense:  16 g    Refill:  6   amLODipine (NORVASC) 5 MG tablet    Sig: Take 1 tablet (5 mg total) by mouth at bedtime.    Dispense:  90 tablet    Refill:  1   02/10/22, DO 02/15/2022, 4:11 PM PGY-1, [facility]

## 2022-02-15 NOTE — Patient Instructions (Signed)
It was great to see you today! Thank you for choosing [facility] for your primary care. Dean Morris was seen for sleeping difficulty and allergies.  Today we addressed: Allergies: I prescribed you Flonase and Claritin to use daily. Difficulty: Please go to your sleep testing so you may get set up with CPAP.  Do not appear to be holding extra fluid on you at this time but please discuss your Lasix dosing with the cardiologist.  Meds ordered this encounter  Medications   allopurinol (ZYLOPRIM) 100 MG tablet    Sig: Take 1 tablet (100 mg total) by mouth daily.    Dispense:  30 tablet    Refill:  2   loratadine (CLARITIN) 10 MG tablet    Sig: Take 1 tablet (10 mg total) by mouth daily.    Dispense:  30 tablet    Refill:  11   fluticasone (FLONASE) 50 MCG/ACT nasal spray    Sig: Place 2 sprays into both nostrils daily.    Dispense:  16 g    Refill:  6   amLODipine (NORVASC) 5 MG tablet    Sig: Take 1 tablet (5 mg total) by mouth at bedtime.    Dispense:  90 tablet    Refill:  1    If you haven't already, sign up for My Chart to have easy access to your labs results, and communication with your primary care physician.  You should return to our clinic No follow-ups on file.  I recommend that you always bring your medications to each appointment as this makes it easy to ensure you are on the correct medications and helps [place] not miss refills when you need them.  Please arrive 15 minutes before your appointment to ensure smooth check in process.  We appreciate your efforts in making this happen.  Please call the clinic at [phone] if your symptoms worsen or you have any concerns.  Thank you for allowing me to participate in your care, [name], DO 02/15/2022, 11:26 AM PGY-1, [facility]

## 2022-02-15 NOTE — Assessment & Plan Note (Addendum)
Consistent sleep difficulty which does not appear to be cardiac related given clinical history. Mr. Dean Morris has been long awaiting diagnosis of sleep apnea and establishment with CPAP. History strongly suggests nighttime awakenings from sleep apnea. Echo on 12/11/21 suggests appropriate LVEF and he is being frequently monitored by Cardiology. He does not appear fluid overloaded although does have weight gain. Advised addressing weight gain after completing sleep study and most recent cardiology visit.

## 2022-02-21 ENCOUNTER — Ambulatory Visit (HOSPITAL_BASED_OUTPATIENT_CLINIC_OR_DEPARTMENT_OTHER): Payer: Medicare HMO | Attending: Cardiovascular Disease | Admitting: Cardiovascular Disease

## 2022-02-21 DIAGNOSIS — I493 Ventricular premature depolarization: Secondary | ICD-10-CM | POA: Insufficient documentation

## 2022-02-21 DIAGNOSIS — G4733 Obstructive sleep apnea (adult) (pediatric): Secondary | ICD-10-CM | POA: Insufficient documentation

## 2022-02-21 DIAGNOSIS — G4736 Sleep related hypoventilation in conditions classified elsewhere: Secondary | ICD-10-CM | POA: Diagnosis not present

## 2022-02-26 ENCOUNTER — Other Ambulatory Visit: Payer: Self-pay | Admitting: Student

## 2022-02-26 ENCOUNTER — Ambulatory Visit (INDEPENDENT_AMBULATORY_CARE_PROVIDER_SITE_OTHER): Payer: Medicare HMO | Admitting: Cardiology

## 2022-02-26 VITALS — BP 134/78 | HR 48 | Ht 66.0 in | Wt 241.9 lb

## 2022-02-26 DIAGNOSIS — I5032 Chronic diastolic (congestive) heart failure: Secondary | ICD-10-CM

## 2022-02-26 DIAGNOSIS — I10 Essential (primary) hypertension: Secondary | ICD-10-CM

## 2022-02-26 DIAGNOSIS — E785 Hyperlipidemia, unspecified: Secondary | ICD-10-CM | POA: Diagnosis not present

## 2022-02-26 DIAGNOSIS — Z7901 Long term (current) use of anticoagulants: Secondary | ICD-10-CM | POA: Diagnosis not present

## 2022-02-26 DIAGNOSIS — D6859 Other primary thrombophilia: Secondary | ICD-10-CM

## 2022-02-26 DIAGNOSIS — I480 Paroxysmal atrial fibrillation: Secondary | ICD-10-CM

## 2022-02-26 DIAGNOSIS — G4733 Obstructive sleep apnea (adult) (pediatric): Secondary | ICD-10-CM | POA: Diagnosis not present

## 2022-02-26 DIAGNOSIS — Z955 Presence of coronary angioplasty implant and graft: Secondary | ICD-10-CM

## 2022-02-26 NOTE — Progress Notes (Signed)
Cardiology Office Note:    Date:  02/28/2022   ID:  Dean Morris, DOB 1955-11-02, MRN [id number]  PCP:  [name], DO  Cardiologist:  Buford Dresser, MD  Referring MD: [name], DO   CC: follow-up  History of Present Illness:    Dean Morris is a 66 y.o. male with a hx of paroxysmal atrial fibrillation, coronary artery disease s/p stent in distant past, hypertension, hyperlipidemia who is seen for follow-up. I initially met him 08/17/2020 as a new consult at the request of [name], DO for the evaluation and management of atrial fibrillation, coronary artery disease.  CV history: Last seen 10/09/2016 at [facility] cardiology. Noted to have OM stent in 2013. Was in NSR but noted prior history of afib, only on aspirin. Per Care Everywhere, had afib RVR initially diagnosed 09/2016 when he was very ill.   At his last appointment he was struggling with insomnia and sob. He was also feeling chest pressure, but when he sat up the pressure resolved. He described the feeling as a "band-like tightness."  Today: Lately, he continues to struggle with nocturia, orthopnea, and PND, also resulting in insomnia. Last week he completed a sleep study. Currently he has the mask, but is still waiting on the CPAP machine.  Once or twice he has felt like he was in atrial fibrillation, most recently about a week ago while he was sitting. This episode lasted for about 5 seconds.  After he finishes walking he may be short of breath. He usually feels better after a 20 second rest.   He has been taking Lasix once daily. Also he has noticed some minor weight gain, and states his abdomen appears more distended on one side.  Remains compliant with Eliquis; no bleeding issues.  He denies any chest pain, lightheadedness, headaches, or syncope.   Past Medical History:  Diagnosis Date   Allergy    seasonal allergies   Anxiety    Arthritis    bilateral hands   Asthma    uses inhaler   CAD  (coronary artery disease)    stent   Cholelithiasis    Chronic kidney disease    CKD-   Depression    GERD (gastroesophageal reflux disease)    not on meds at this time   Gout    Hyperlipidemia    on meds   Hypertension    not on meds at this time   Myocardial infarction [facility]) 2011   hx of   Paroxysmal A-fib [facility])     Past Surgical History:  Procedure Laterality Date   BLADDER REPAIR  1990's   unknown procedure   CARDIOVERSION N/A 11/03/2021   Procedure: CARDIOVERSION;  Surgeon: Jerline Pain, MD;  Location: [facility];  Service: Cardiovascular;  Laterality: N/A;   CORONARY ANGIOPLASTY WITH STENT PLACEMENT     TONSILLECTOMY      Current Medications: Current Outpatient Medications on File Prior to Visit  Medication Sig   acetaminophen (TYLENOL) 500 MG tablet Take 1,000 mg by mouth every 8 (eight) hours as needed for moderate pain.   amiodarone (PACERONE) 200 MG tablet Take 1 tablet (200 mg total) by mouth daily.   amLODipine (NORVASC) 5 MG tablet Take 1 tablet (5 mg total) by mouth at bedtime.   apixaban (ELIQUIS) 5 MG TABS tablet Take 1 tablet (5 mg total) by mouth 2 (two) times daily.   atorvastatin (LIPITOR) 40 MG tablet TAKE 1 TABLET (40 MG TOTAL) BY MOUTH DAILY.   diclofenac  Sodium (VOLTAREN) 1 % GEL APPLY 2 GRAMS TOPICALLY DAILY AS NEEDED.   ePHEDrine-guaiFENesin (PRIMATENE ASTHMA) 12.5-200 MG TABS Take 1 spray by mouth in the morning and at bedtime.   fluticasone (FLONASE) 50 MCG/ACT nasal spray Place 2 sprays into both nostrils daily.   furosemide (LASIX) 40 MG tablet Take 40 mg by mouth daily.   loratadine (CLARITIN) 10 MG tablet Take 1 tablet (10 mg total) by mouth daily.   losartan (COZAAR) 50 MG tablet Take 1 tablet (50 mg total) by mouth at bedtime.   metoprolol tartrate (LOPRESSOR) 100 MG tablet Take 1 tablet (100 mg total) by mouth 2 (two) times daily.   nitroGLYCERIN (NITROSTAT) 0.4 MG SL tablet Place 1 tablet (0.4 mg total) under the tongue every 5 (five)  minutes as needed for chest pain.   Saline (ARY NASAL MIST ALLERGY/SINUS) 2.65 % SOLN Place 1 spray into the nose daily as needed (allergies).   No current facility-administered medications on file prior to visit.     Allergies:   Patient has no known allergies.   Social History   Tobacco Use   Smoking status: Former    Packs/day: 0.10    Years: 30.00    Total pack years: 3.00    Types: Cigarettes    Quit date: 09/11/2011    Years since quitting: 10.4   Smokeless tobacco: Former    Quit date: 2021  Vaping Use   Vaping Use: Never used  Substance Use Topics   Alcohol use: Yes    Comment: 2-3 x per month   Drug use: No    Family History: family history includes Colon cancer (age of onset: 73) in his maternal grandmother; Heart failure in his brother and mother. There is no history of Esophageal cancer, Rectal cancer, or Stomach cancer.  ROS:   Please see the history of present illness. (+) PND (+) Orthopnea (+) Nocturia (+) Insomnia (+) Shortness of breath All other systems are reviewed and negative.    EKGs/Labs/Other Studies Reviewed:    The following studies were reviewed today:  Echo 12/11/2021: IMPRESSIONS    1. Left ventricular ejection fraction, by estimation, is 60 to 65%. The  left ventricle has normal function. The left ventricle has no regional  wall motion abnormalities. There is moderate left ventricular hypertrophy.  Left ventricular diastolic  parameters are indeterminate.   2. Right ventricular systolic function is normal. The right ventricular  size is normal. There is normal pulmonary artery systolic pressure. The  estimated right ventricular systolic pressure is 16.6 mmHg.   3. Left atrial size was mildly dilated.   4. The mitral valve is normal in structure. No evidence of mitral valve  regurgitation. No evidence of mitral stenosis.   5. The aortic valve is normal in structure. Aortic valve regurgitation is  trivial. No aortic stenosis is  present.   6. Aortic dilatation noted. There is mild dilatation of the aortic root,  measuring 40 mm. There is mild dilatation of the ascending aorta,  measuring 41 mm.   7. The inferior vena cava is dilated in size with >50% respiratory  variability, suggesting right atrial pressure of 8 mmHg.   Comparison(s): No significant change from prior study. Prior images  reviewed side by side. EF 60%.   Bilateral LE Venous DVT 07/27/2021: Summary:  RIGHT:  - There is no evidence of deep vein thrombosis in the lower extremity.  - No cystic structure found in the popliteal fossa.     LEFT:  -  No evidence of common femoral vein obstruction.   Echo 04/10/20 1. Left ventricular ejection fraction, by estimation, is 60 to 65%. The  left ventricle has normal function. The left ventricle has no regional  wall motion abnormalities. There is severe left ventricular hypertrophy.  Left ventricular diastolic parameters   are indeterminate.   2. Right ventricular systolic function is normal. The right ventricular  size is normal. There is normal pulmonary artery systolic pressure.   3. The mitral valve is normal in structure. No evidence of mitral valve  regurgitation. No evidence of mitral stenosis.   4. The aortic valve is tricuspid. Aortic valve regurgitation is not  visualized. No aortic stenosis is present.   5. Aortic dilatation noted. 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   6. The inferior vena cava is normal in size with greater than 50%  respiratory variability, suggesting right atrial pressure of 3 mmHg.   EKG:  EKG is personally reviewed.   02/26/2022:  sinus bradycardia at 48 bpm, RBBB 12/13/2021: afib at 77 bpm, RBBB 09/29/2021: atrial fibrillation RVR at 118 bpm. IVCD 08/17/2020: NSR at 75 bpm, IVCD, nonspecific ST-T wave pattern. Similar to ECG from 04/11/20. Of note, the ECG from 04/11/20 is labeled atrial fibrillation, but it is NSR with PVC  Recent  Labs: 10/30/2021: Hemoglobin 15.2; Platelets 296 11/24/2021: ALT 20; BNP 289.5; BUN 24; Creatinine, Ser 1.31; Potassium 5.1; Sodium 143; TSH 2.290   Recent Lipid Panel    Component Value Date/Time   CHOL 132 08/31/2021 1505   TRIG 149 08/31/2021 1505   HDL 27 (L) 08/31/2021 1505   CHOLHDL 4.9 08/31/2021 1505   LDLCALC 79 08/31/2021 1505    Physical Exam:    VS:  BP 134/78 (BP Location: Right Arm, Patient Position: Sitting, Cuff Size: Large)   Pulse (!) 48   Ht '5\' 6"'  (1.676 m)   Wt 241 lb 14.4 oz (109.7 kg)   BMI 39.04 kg/m     Wt Readings from Last 3 Encounters:  02/26/22 241 lb 14.4 oz (109.7 kg)  02/21/22 245 lb (111.1 kg)  02/15/22 243 lb (110.2 kg)    GEN: Well nourished, well developed in no acute distress HEENT: Normal, moist mucous membranes NECK: No JVD appreciated CARDIAC: slow, regular rhythm, normal S1 and S2, no rubs or gallops. No murmur.  VASCULAR: Radial and DP pulses 2+ bilaterally. No carotid bruits RESPIRATORY:  clear bilaterally, no rales, wheezing or rhonchi  ABDOMEN: Soft, non-tender, non-distended MUSCULOSKELETAL:  Ambulates independently SKIN: Warm and dry, no significant LE edema NEUROLOGIC:  Alert and oriented x 3. No focal neuro deficits noted. PSYCHIATRIC:  Normal affect    ASSESSMENT:    1. Paroxysmal A-fib ([place])   2. Hypercoagulable state ([place])   3. History of coronary angioplasty with insertion of stent   4. Hyperlipidemia LDL goal <70   5. Obstructive sleep apnea (adult) (pediatric)   6. Essential hypertension   7. Chronic diastolic heart failure ([place])   8. Long term current use of anticoagulant     PLAN:    Chronic diastolic heart failure -appears euvolemic today  paroxysmal atrial fibrillation Secondary hypercoagulable state CHA2DS2/VAS Stroke Risk Points=3 -continue long term anticoagulation with apixaban -did not maintain sinus rhythm after cardioversion 10/2021 beyond a week -started on amiodarone 11/24/21 -continued on  metoprolol tartrate 100 mg BID. He is asymptomatic at current heart rate, but if he has lightheadedness would cut this back to 50 mg BID -given age,  would like to avoid amiodarone long term, but will continue in the short term for now.  History of CAD s/p OM stent in 2013 Mixed hyperlpidemia -continue aspirin 81 mg, if bleeding issues in the future could drop aspirin and continue DOAC alone -continue atorvastatin 40 mg daily. -normal EF as per echo -reviewed red flag warning signs that need immediate medical attention  Hypertension: -goal <130/80, near goal today -continue amlodipine (watch for worsening gout, had to stop previously) and losartan -metoprolol as above  Cardiac risk counseling and prevention recommendations: -recommend heart healthy/Mediterranean diet, with whole grains, fruits, vegetable, fish, lean meats, nuts, and olive oil. Limit salt. -recommend moderate walking, 3-5 times/week for 30-50 minutes each session. Aim for at least 150 minutes.week. Goal should be pace of 3 miles/hours, or walking 1.5 miles in 30 minutes -recommend avoidance of tobacco products. Avoid excess alcohol.  Plan for follow up: 6 months or sooner as needed  Buford Dresser, MD, PhD, [facility] at [facility] [address] [place], [place] [address] [phone]   Medication Adjustments/Labs and Tests Ordered: Current medicines are reviewed at length with the patient today.  Concerns regarding medicines are outlined above.   Orders Placed This Encounter  Procedures   EKG 12-Lead   No orders of the defined types were placed in this encounter.  Patient Instructions  Medication Instructions:  Your Physician recommend you continue on your current medication as directed.    *If you need a refill on your cardiac medications before your next appointment, please call your  pharmacy*   Lab Work: None ordered today   Testing/Procedures: None ordered today   Follow-Up: At [facility], you and your health needs are our priority.  As part of our continuing mission to provide you with exceptional heart care, we have created designated Provider Care Teams.  These Care Teams include your primary Cardiologist (physician) and Advanced Practice Providers (APPs -  Physician Assistants and Nurse Practitioners) who all work together to provide you with the care you need, when you need it.  We recommend signing up for the patient portal called "MyChart".  Sign up information is provided on this After Visit Summary.  MyChart is used to connect with patients for Virtual Visits (Telemedicine).  Patients are able to view lab/test results, encounter notes, upcoming appointments, etc.  Non-urgent messages can be sent to your provider as well.   To learn more about what you can do with MyChart, go to [email or web address].    Your next appointment:   6 month(s)  The format for your next appointment:   In Person  Provider:   Buford Dresser, MD             [facility],acting as a scribe for Buford Dresser, MD.,have documented all relevant documentation on the behalf of Buford Dresser, MD,as directed by  Buford Dresser, MD while in the presence of Buford Dresser, MD.  I, Buford Dresser, MD, have reviewed all documentation for this visit. The documentation on 02/28/22 for the exam, diagnosis, procedures, and orders are all accurate and complete.   Signed, Buford Dresser, MD PhD 02/28/2022 10:36 AM    [place]

## 2022-02-26 NOTE — Patient Instructions (Signed)

## 2022-02-28 ENCOUNTER — Encounter (HOSPITAL_BASED_OUTPATIENT_CLINIC_OR_DEPARTMENT_OTHER): Payer: Self-pay | Admitting: Cardiology

## 2022-03-04 ENCOUNTER — Encounter (HOSPITAL_BASED_OUTPATIENT_CLINIC_OR_DEPARTMENT_OTHER): Payer: Self-pay | Admitting: Cardiovascular Disease

## 2022-03-12 ENCOUNTER — Telehealth: Payer: Self-pay | Admitting: *Deleted

## 2022-03-12 NOTE — Telephone Encounter (Signed)
-----   Message from Lennette Bihari, MD sent at 03/04/2022  9:17 PM EDT ----- Dean Morris, please notify pt and set up with DME for BiPAP

## 2022-03-12 NOTE — Telephone Encounter (Signed)
Patient notified BIPAP titration has been completed. Order for BIPAP machine [facility]. Patient voiced understanding.

## 2022-03-19 IMAGING — CT CT ABD-PELV W/O CM
2 of 4 series · 16 of 46 positions shown, 18 images · non-contrast
Comparison: None.

CLINICAL DATA: Abdominal pain

EXAM:
CT ABDOMEN AND PELVIS WITHOUT CONTRAST
TECHNIQUE: Multidetector CT imaging of the abdomen and pelvis was performed
following the standard protocol without IV contrast.

[Series 3: a/p w/o 5mm · axial · non-contrast · 0.95mm/px · z∈[+792,+1212]mm · 13 of 94 slices shown, 15 images]
[im 5/94  soft-tissue]
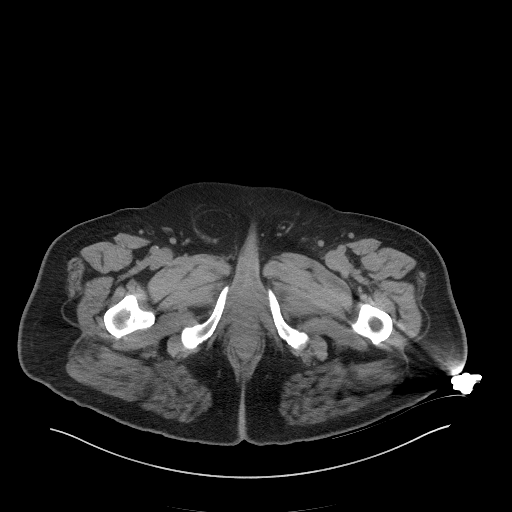
[im 5/94  bone]
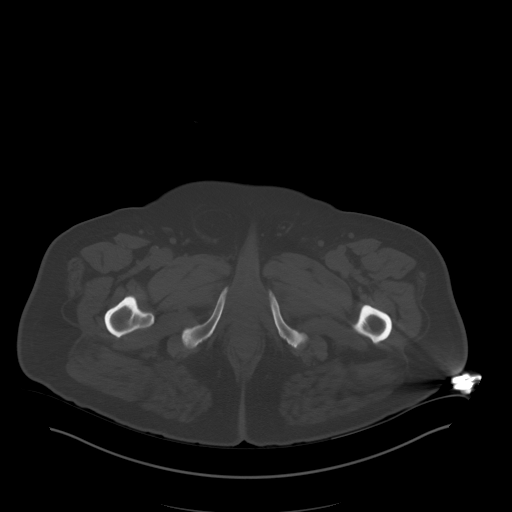
[im 13/94  soft-tissue]
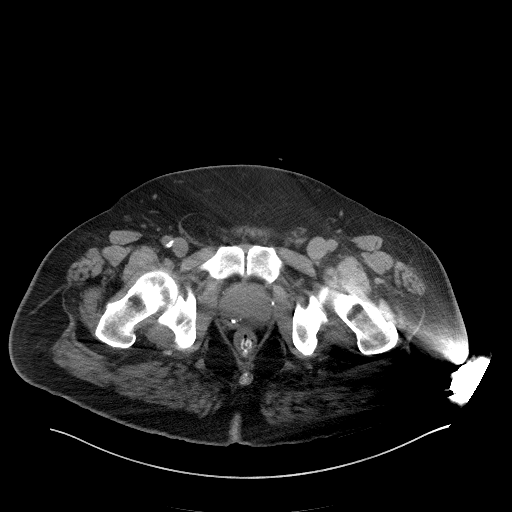
[im 21/94  soft-tissue]
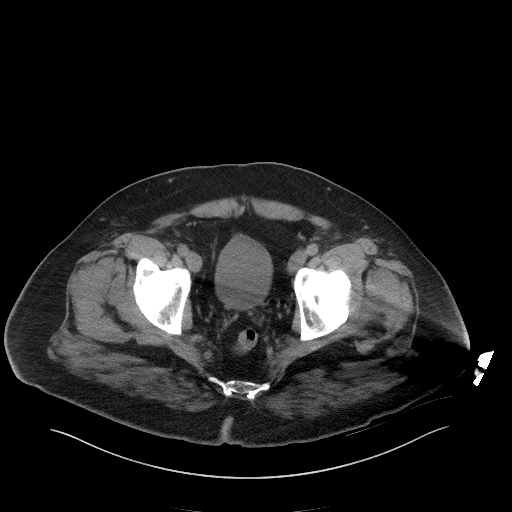
[im 25/94  soft-tissue]
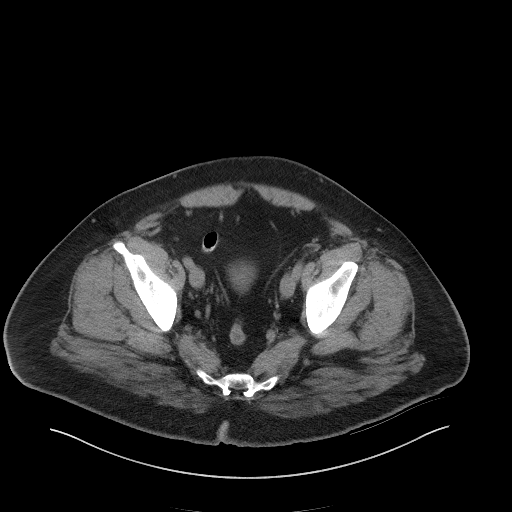
[im 33/94  soft-tissue]
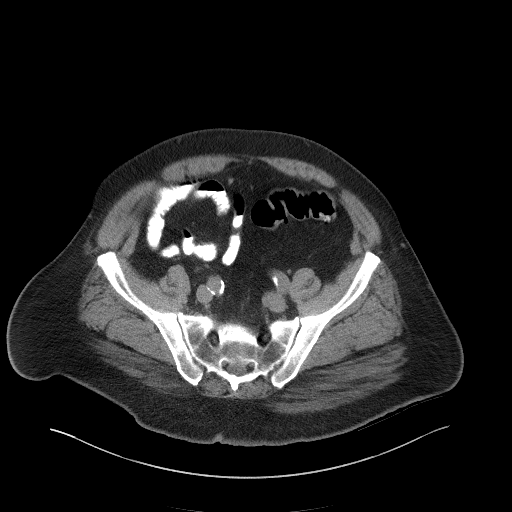
[im 41/94  soft-tissue]
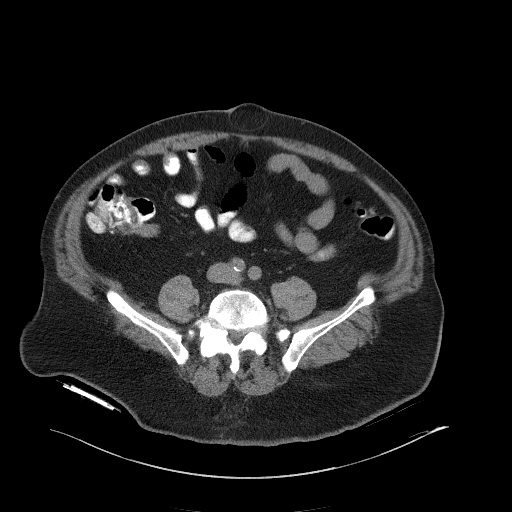
[im 49/94  soft-tissue]
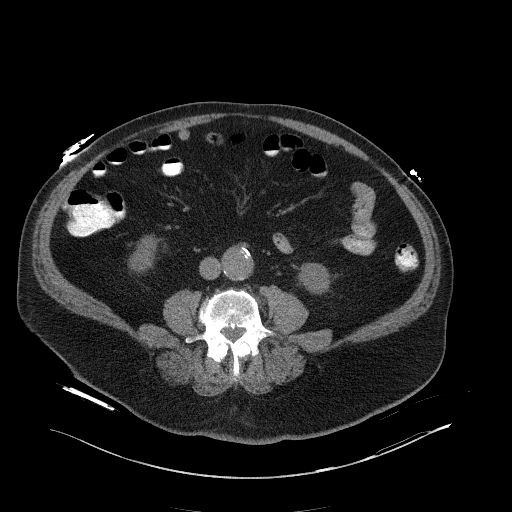
[im 53/94  soft-tissue]
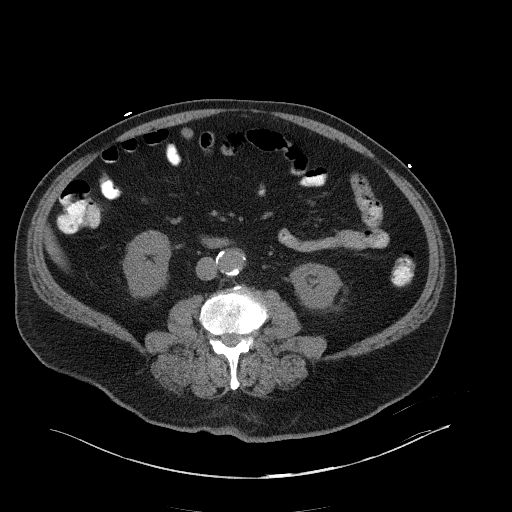
[im 61/94  soft-tissue]
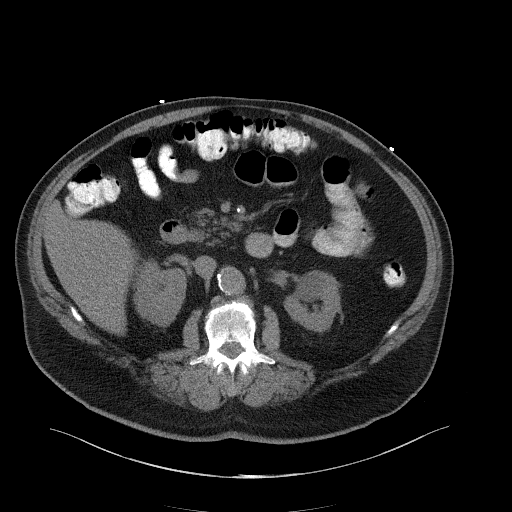
[im 61/94  bone]
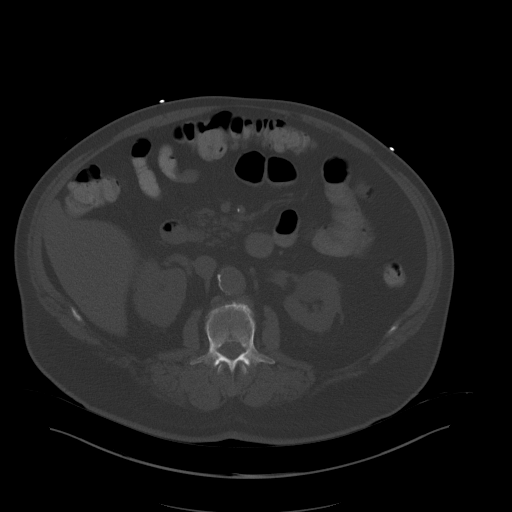
[im 69/94  soft-tissue]
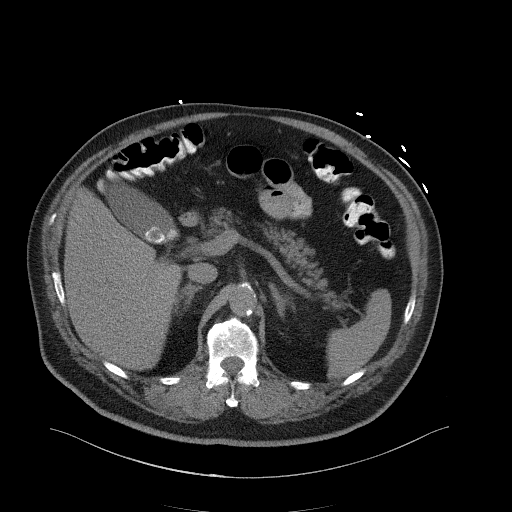
[im 73/94  soft-tissue]
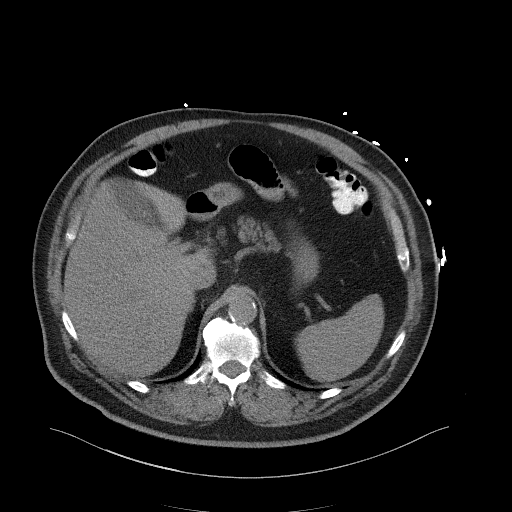
[im 81/94  soft-tissue]
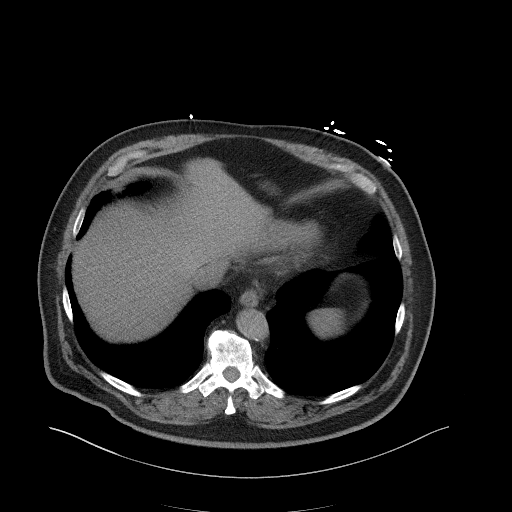
[im 89/94  soft-tissue]
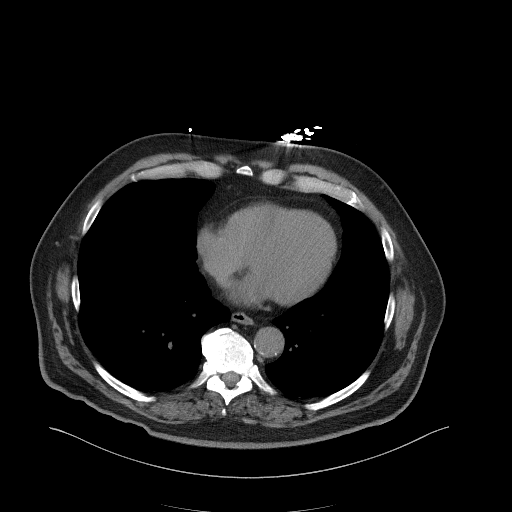

[Series 6: a/p w/o cor · coronal · non-contrast · 0.92mm/px · 3 of 196 slices shown]
[im 66/196  soft-tissue]
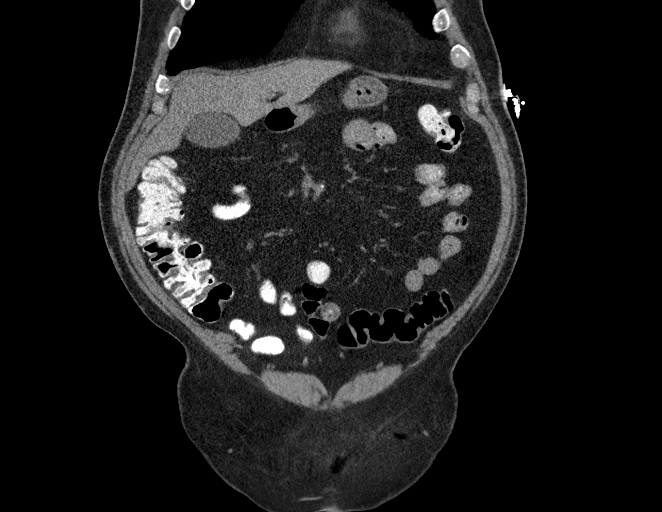
[im 87/196  soft-tissue]
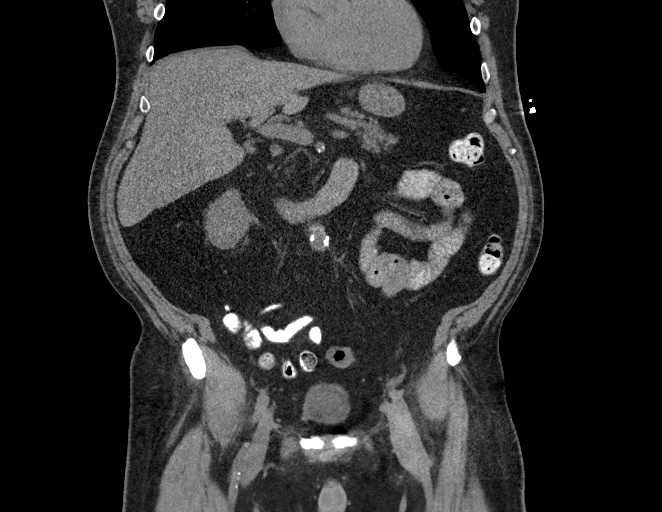
[im 109/196  soft-tissue]
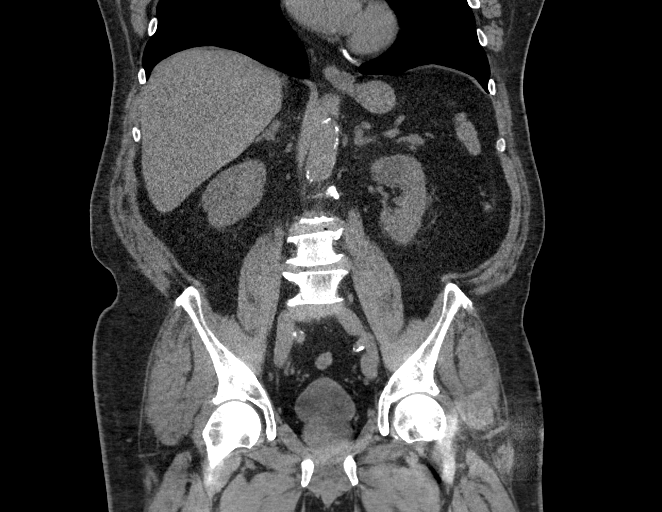

[16 of 46 positions shown; findings below may reference images not displayed]

FINDINGS: Lower chest: The visualized heart size within normal limits. No
pericardial fluid/thickening.

No hiatal hernia.

The visualized portions of the lungs are clear.

Hepatobiliary: Although limited due to the lack of intravenous
contrast, normal in appearance without gross focal abnormality.
Layering calcified gallstones are present.

Pancreas:  Unremarkable.  No surrounding inflammatory changes.

Spleen: Normal in size. Although limited due to the lack of
intravenous contrast, normal in appearance.

Adrenals/Urinary Tract: Both adrenal glands appear normal. A
low-density 3 cm lesion seen in the upper pole the left kidney. No
renal or collecting system calculi are noted. The bladder is
unremarkable.

Stomach/Bowel: The stomach, small bowel, and colon are normal in
appearance. No inflammatory changes or obstructive findings.
appendix is normal.

Vascular/Lymphatic: There are no enlarged abdominal or pelvic lymph
nodes. Scattered dense aortic atherosclerosis is seen.

Reproductive: The prostate is unremarkable.

Other: There is a small right-sided fat containing inguinal hernia
present. A small anterior umbilical hernia containing fat is noted.

Musculoskeletal: No acute or significant osseous findings.
IMPRESSION: No acute intra-abdominal or pelvic pathology to explain the
patient's symptoms.

Cholelithiasis

Small fat containing anterior abdominal wall hernia and right
inguinal hernia.

Aortic Atherosclerosis ([id number]-XQZ.Z).

## 2022-03-19 IMAGING — DX DG CHEST 1V PORT
1 series · 2 of 2 positions shown · non-contrast
Comparison: 09/11/2016

CLINICAL DATA: Chest pain

EXAM:
PORTABLE CHEST 1 VIEW

[Series 1: chest · 0.14mm/px · 2 of 2 slices shown]
[im 1/2]
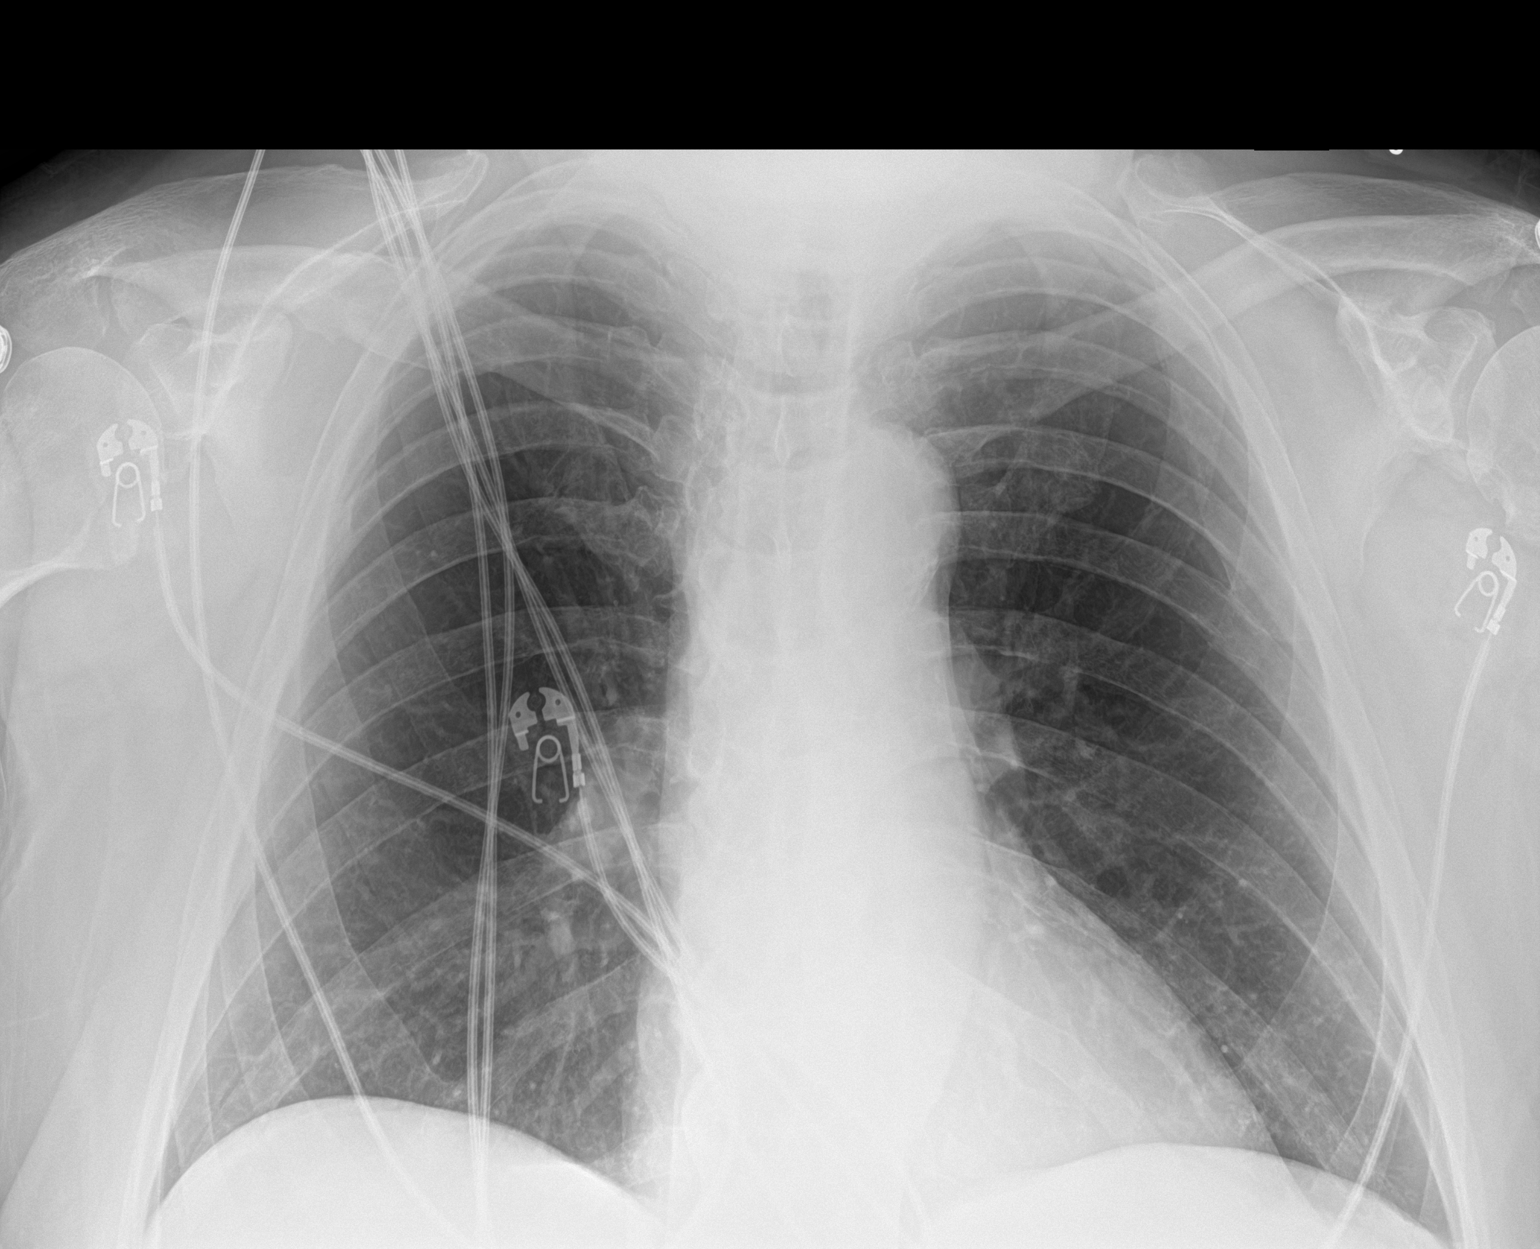
[im 2/2]
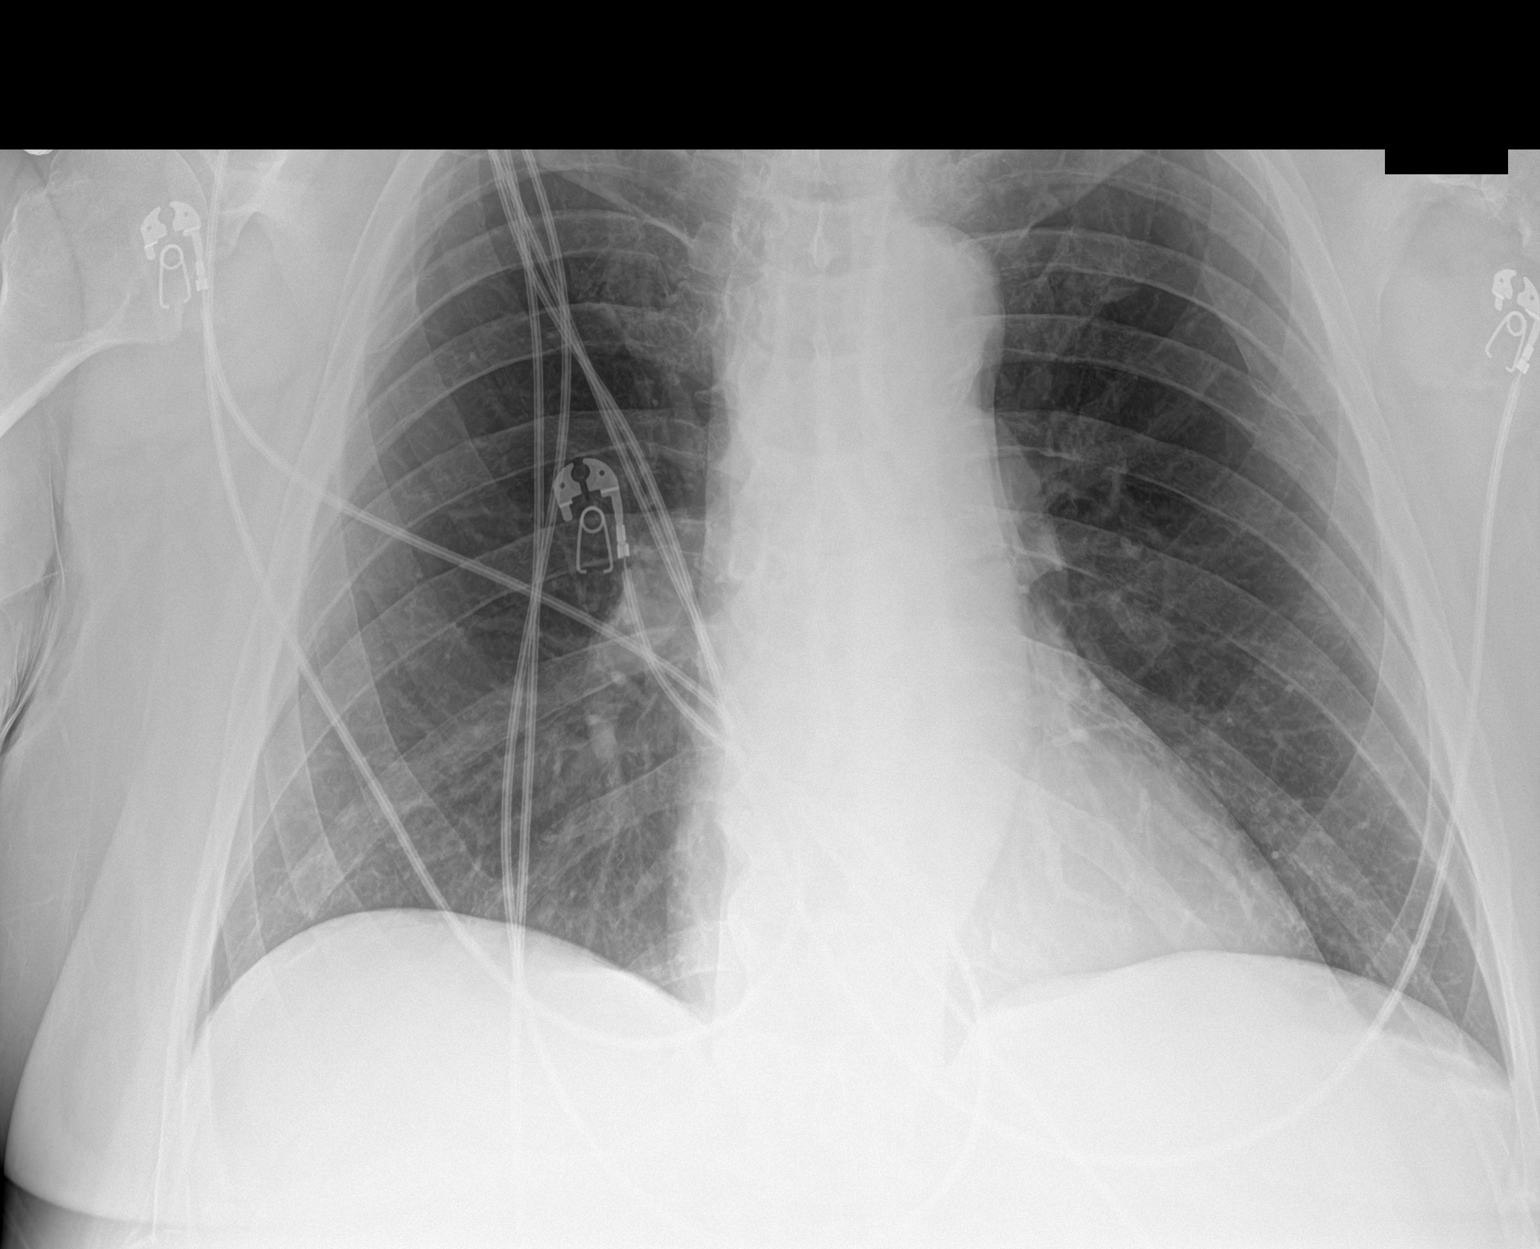

[2 of 2 positions shown; findings below may reference images not displayed]

FINDINGS: The heart size and mediastinal contours are within normal limits.
Both lungs are clear. The visualized skeletal structures are
unremarkable.
IMPRESSION: No active disease.

## 2022-03-19 IMAGING — US US ABDOMEN LIMITED
1 series · 14 of 25 positions shown · non-contrast
Comparison: CT from earlier in the same day.

CLINICAL DATA: Follow-up cholelithiasis and abdominal pain

EXAM:
ULTRASOUND ABDOMEN LIMITED RIGHT UPPER QUADRANT

[Series 1: us abdomen limited ruq · 14 of 57 slices shown]
[im 1/57]
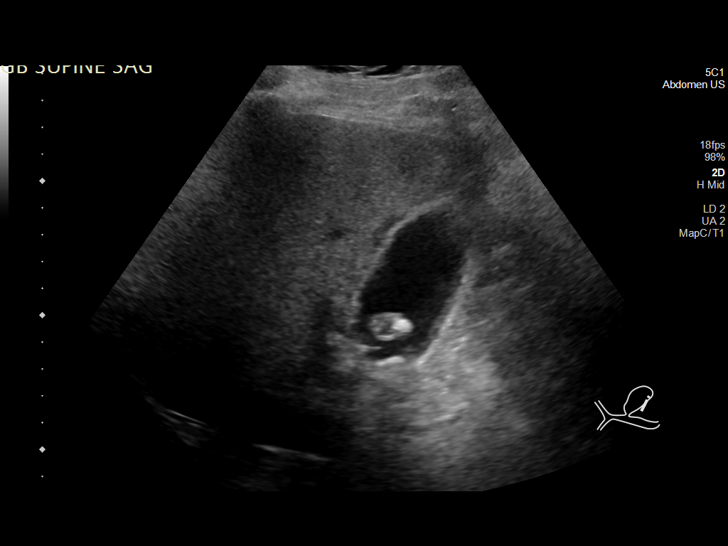
[im 5/57]
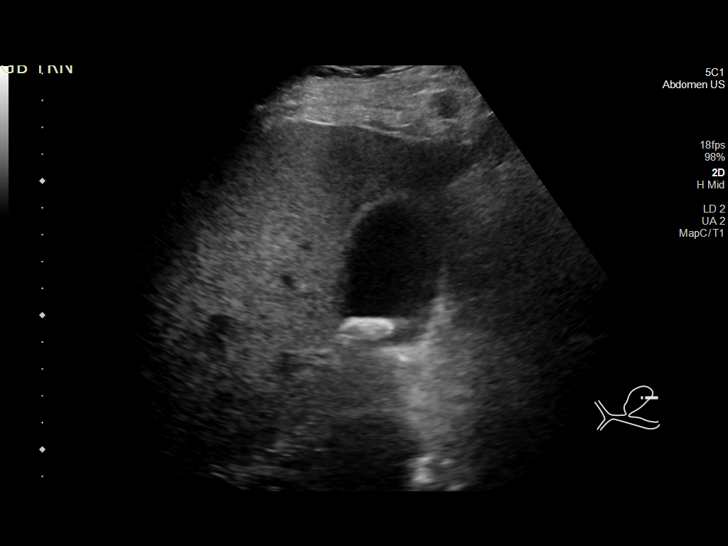
[im 10/57]
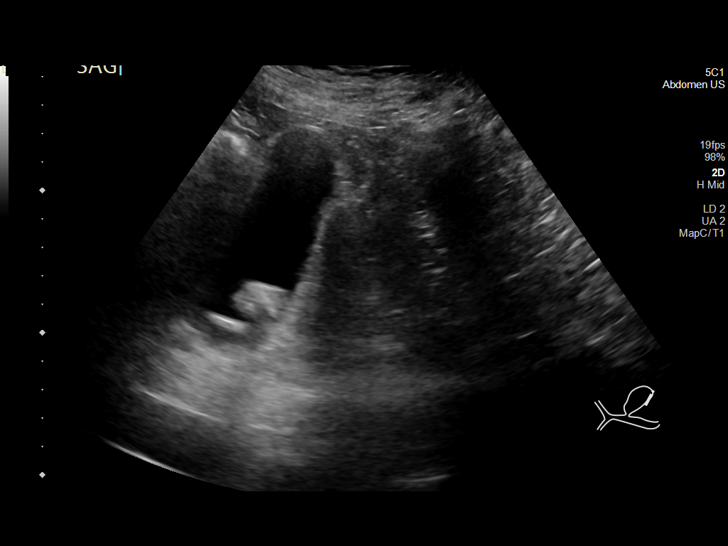
[im 15/57]
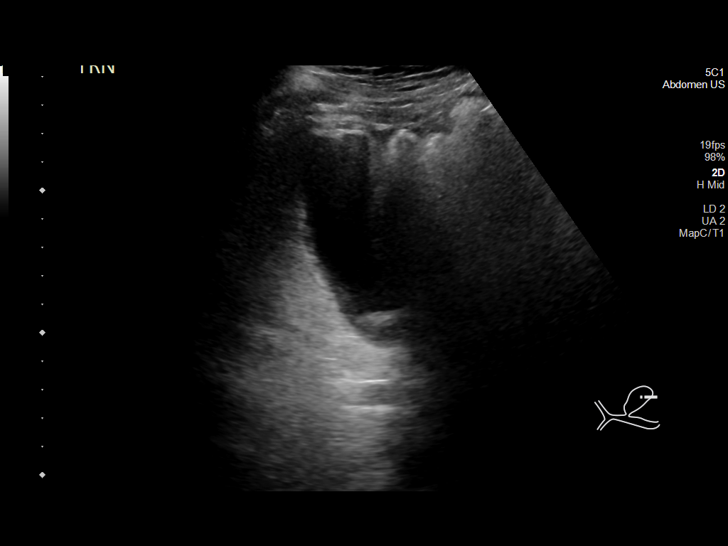
[im 19/57]
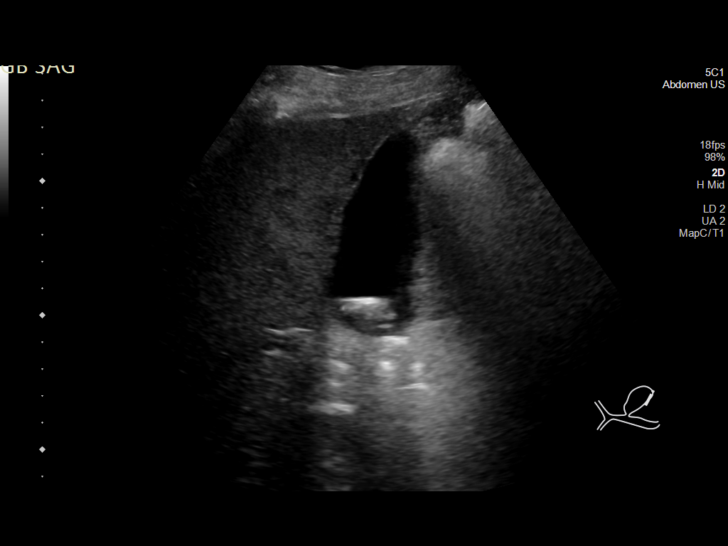
[im 22/57]
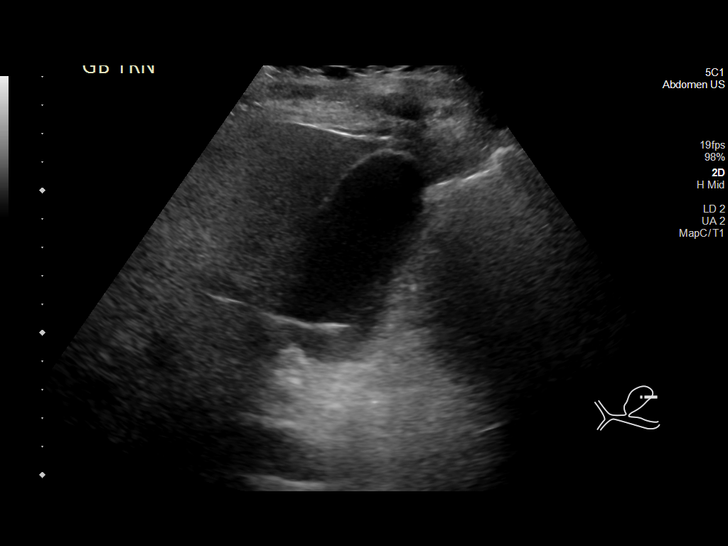
[im 26/57]
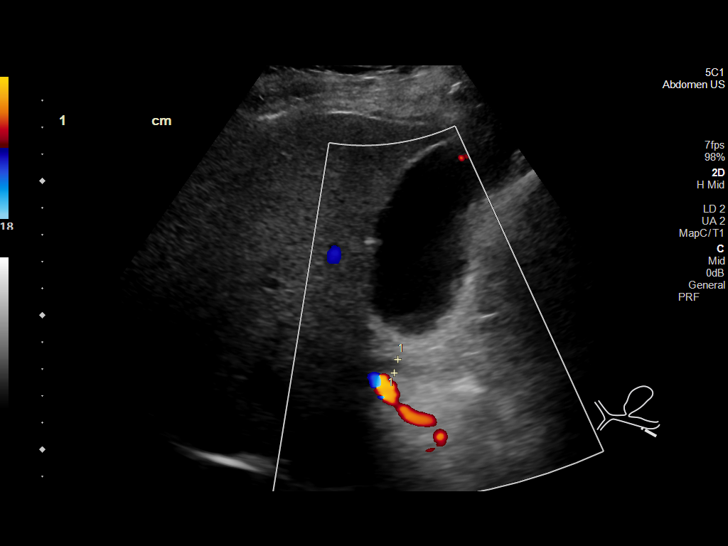
[im 31/57]
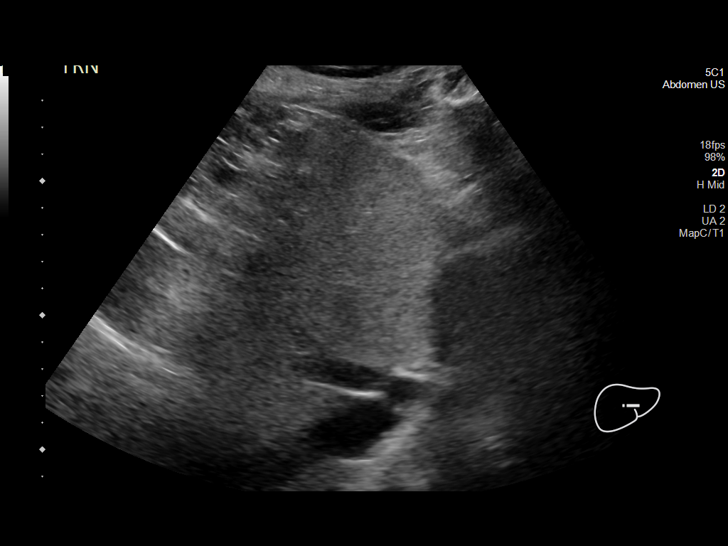
[im 36/57]
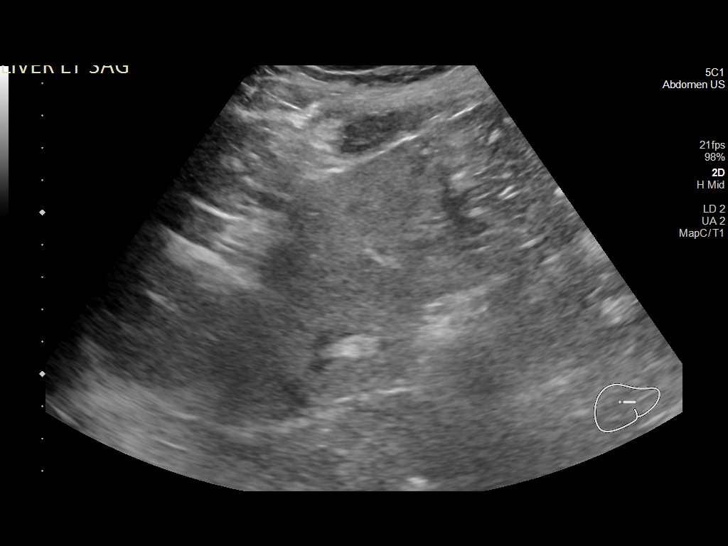
[im 38/57]
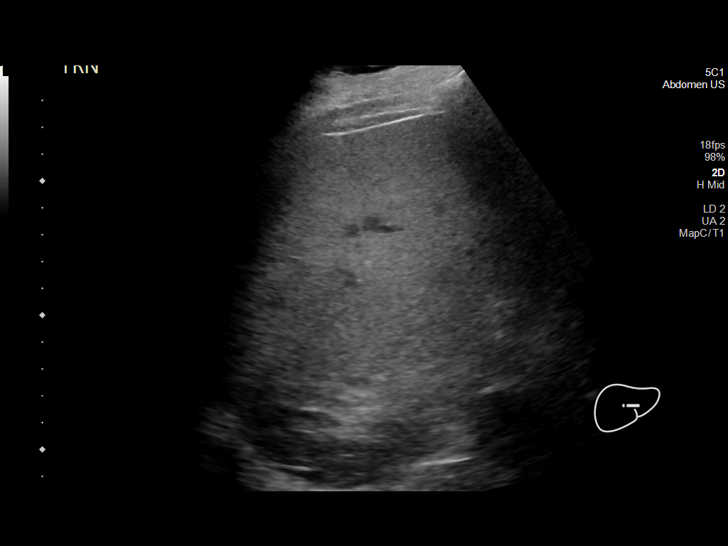
[im 43/57]
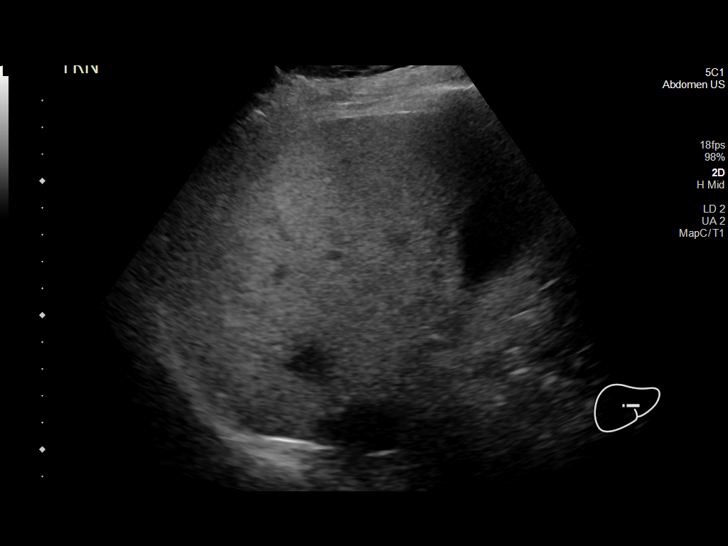
[im 47/57]
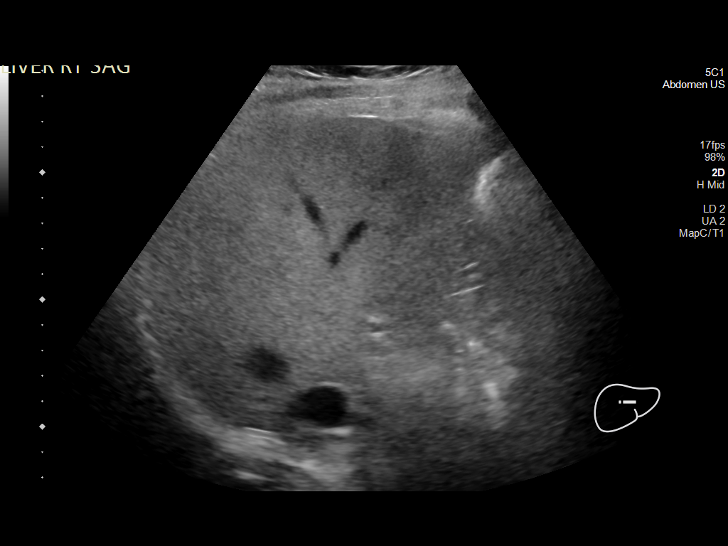
[im 52/57]
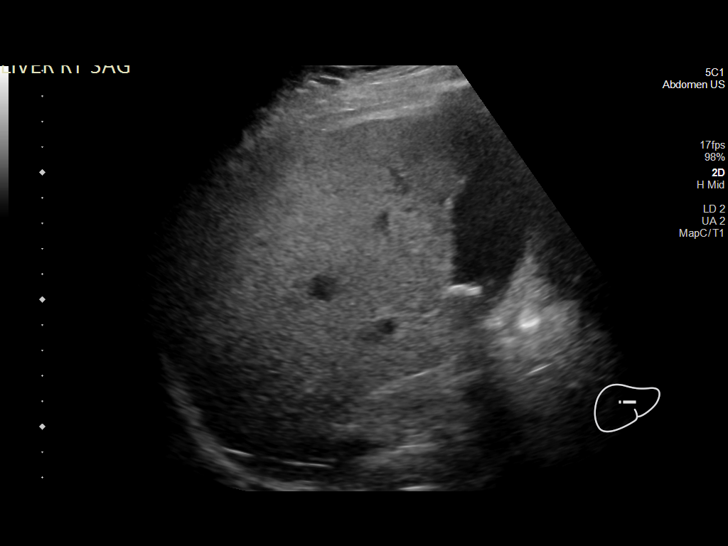
[im 57/57]
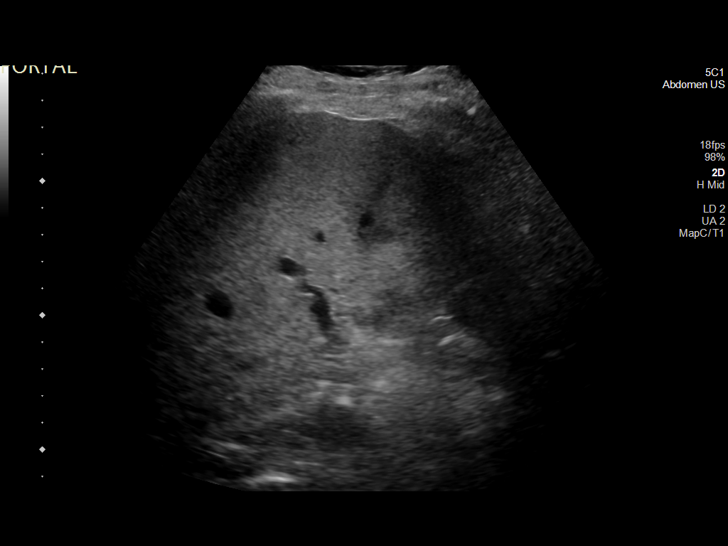

[14 of 25 positions shown; findings below may reference images not displayed]

FINDINGS: Gallbladder:

Gallbladder is well distended with multiple gallstones within. No
wall thickening or pericholecystic fluid is noted. Negative
sonographic Murphy's sign is elicited.

Common bile duct:

Diameter: 5.2 mm.

Liver:

Diffusely increased in echogenicity consistent with fatty
infiltration. Portal vein is patent on color Doppler imaging with
normal direction of blood flow towards the liver.

Other: None.
IMPRESSION: Cholelithiasis without complicating factors.

Fatty infiltration of the liver.

## 2022-05-07 DIAGNOSIS — G4733 Obstructive sleep apnea (adult) (pediatric): Secondary | ICD-10-CM | POA: Diagnosis not present

## 2022-05-25 NOTE — Progress Notes (Incomplete)
  SUBJECTIVE:   CHIEF COMPLAINT / HPI:   Hypertension: BP: (!) 150/82 today. Home medications include: Losartan 50 mg daily, Lopressor 100 mg twice daily, amlodipine 5 mg daily. He endorses taking these medications as prescribed. Does not check blood pressure at home. Exercise: States he is going to start working on losing weight on October 1 with the cardiac rehab group where he has been previously because he is allowed to go there for free. Patient has had a BMP in the past 1 year.  Lower extremity swelling: Notable lower extremity swelling, states he was told by his cardiologist that he can take his Lasix when needed.  He has not been taking it because it is frustrating to urinate when he is at the [facility] and has to run across the street to go to the bathroom.  PERTINENT  PMH / PSH: HTN, HLD, CAD, paroxysmal A-fib, GERD  OBJECTIVE:  BP (!) 150/82   Pulse (!) 49   Ht 5\' 6"  (1.676 m)   Wt 256 lb 9.6 oz (116.4 kg)   SpO2 98%   BMI 41.42 kg/m   General: NAD, pleasant, able to participate in exam Cardiac: bradycardic, regular rhythm, no murmurs auscultated Respiratory: CTAB, normal WOB Abdomen: soft, non-tender, non-distended, normoactive bowel sounds Extremities: warm and well perfused, +2 pitting edema of BLEs Psych: Normal affect and mood  ASSESSMENT/PLAN:  Essential hypertension Assessment & Plan: BP: (!) 150/82 today. Poorly controlled. Goal of <130/80. Continue to work on healthy dietary habits and exercise. Follow up in 1 month.  Recheck BMP in 1 week. Medication regimen: Amlodipine 5 mg daily, Lopressor 100 mg twice daily, increase losartan to 100 mg daily  Orders: -     Basic metabolic panel; Future -     Losartan Potassium; Take 1 tablet (100 mg total) by mouth at bedtime.  Dispense: 30 tablet; Refill: 3  Chronic diastolic heart failure [facility]) Assessment & Plan: Per chart review, was euvolemic when he saw cardiology on 02/26/2022.  Significant pitting edema without  pulmonary side effects at this time.  Advised taking Lasix daily until swelling goes down and then returning back to as needed usage.  Discussed repercussions of not managing fluid status appropriately.  Patient amenable to plan.   Need for immunization against influenza -     Flu Vaccine QUAD High Dose(Fluad)   Return in about 1 week (around 06/04/2022) for Lab appointment. 06/06/2022, DO 05/28/2022, 3:33 PM PGY-2, [facility]

## 2022-05-28 ENCOUNTER — Encounter: Payer: Self-pay | Admitting: Student

## 2022-05-28 ENCOUNTER — Ambulatory Visit (INDEPENDENT_AMBULATORY_CARE_PROVIDER_SITE_OTHER): Payer: Medicare HMO | Admitting: Student

## 2022-05-28 VITALS — BP 150/82 | HR 49 | Ht 66.0 in | Wt 256.6 lb

## 2022-05-28 DIAGNOSIS — I5032 Chronic diastolic (congestive) heart failure: Secondary | ICD-10-CM

## 2022-05-28 DIAGNOSIS — Z23 Encounter for immunization: Secondary | ICD-10-CM

## 2022-05-28 DIAGNOSIS — I10 Essential (primary) hypertension: Secondary | ICD-10-CM

## 2022-05-28 MED ORDER — LOSARTAN POTASSIUM 100 MG PO TABS: 100.0000 mg | ORAL_TABLET | Freq: Every day | ORAL | 3 refills | Status: DC

## 2022-05-28 NOTE — Patient Instructions (Signed)
It was great to see you today! Thank you for choosing [facility] for your primary care. Dean Morris was seen for follow-up.  Today we addressed: Hypertension: Blood pressure is elevated today.  We are increasing your losartan.  I will need you to come back for a lab visit in 1 to 2 weeks to make sure this increase in dosage does not alter your electrolyte levels. Heart failure: Your legs are holding fluid.  This is a result of you not taking your Lasix medication.  If you notice leg swelling like this, you need to take a dose of your Lasix until the swelling goes down.  Please restart this daily until you notice the fluid in your legs decreasing.  If you haven't already, sign up for My Chart to have easy access to your labs results, and communication with your primary care physician.  I recommend that you always bring your medications to each appointment as this makes it easy to ensure you are on the correct medications and helps [place] not miss refills when you need them. Call the clinic at [phone] if your symptoms worsen or you have any concerns.  You should return to our clinic Return in about 1 week (around 06/04/2022) for Lab appointment. Please arrive 15 minutes before your appointment to ensure smooth check in process.  We appreciate your efforts in making this happen.  Thank you for allowing me to participate in your care, [name], DO 05/28/2022, 2:30 PM PGY-2, [place]

## 2022-05-28 NOTE — Assessment & Plan Note (Signed)
Per chart review, was euvolemic when he saw cardiology on 02/26/2022.  Significant pitting edema without pulmonary side effects at this time.  Advised taking Lasix daily until swelling goes down and then returning back to as needed usage.  Discussed repercussions of not managing fluid status appropriately.  Patient amenable to plan.

## 2022-05-28 NOTE — Assessment & Plan Note (Addendum)
BP: (!) 150/82 today. Poorly controlled. Goal of <130/80. Continue to work on healthy dietary habits and exercise. Follow up in 1 month.  Recheck BMP in 1 week. Medication regimen: Amlodipine 5 mg daily, Lopressor 100 mg twice daily, increase losartan to 100 mg daily

## 2022-05-30 ENCOUNTER — Other Ambulatory Visit: Payer: Self-pay | Admitting: Student

## 2022-06-04 ENCOUNTER — Other Ambulatory Visit: Payer: Medicare HMO

## 2022-06-13 ENCOUNTER — Telehealth: Payer: Self-pay | Admitting: Cardiology

## 2022-06-13 NOTE — Telephone Encounter (Signed)
Pt is requesting call back to discuss how he had to return the sleep apnea machine due to claustrophobia and would like a call back to discuss options.

## 2022-06-15 NOTE — Telephone Encounter (Signed)
Called patient to schedule a sooner appt to discuss his option for oxygen and sleep apnea machine. He states he needs to call his insurance first, but will call us back to schedule a sooner appt.

## 2022-07-12 ENCOUNTER — Telehealth (HOSPITAL_BASED_OUTPATIENT_CLINIC_OR_DEPARTMENT_OTHER): Payer: Self-pay | Admitting: Cardiology

## 2022-07-12 NOTE — Telephone Encounter (Signed)
Spoke with patient who stated last time he tried to come in for lab prior to appointment it wouldn't be paid for  Did not see where any lab ordered so advised to just have water or black coffee morning of his 12/15 visit with Dr Harrell Gave  Patient verbalized understanding

## 2022-07-12 NOTE — Telephone Encounter (Signed)
New Message:     Patient wants to know when is he supposed to come in for lab work?

## 2022-08-24 ENCOUNTER — Ambulatory Visit (INDEPENDENT_AMBULATORY_CARE_PROVIDER_SITE_OTHER): Payer: Medicare HMO | Admitting: Cardiology

## 2022-08-24 ENCOUNTER — Other Ambulatory Visit: Payer: Self-pay | Admitting: Family

## 2022-08-24 ENCOUNTER — Encounter (HOSPITAL_BASED_OUTPATIENT_CLINIC_OR_DEPARTMENT_OTHER): Payer: Self-pay | Admitting: Cardiology

## 2022-08-24 VITALS — BP 118/80 | HR 52 | Ht 66.0 in | Wt 260.4 lb

## 2022-08-24 DIAGNOSIS — I10 Essential (primary) hypertension: Secondary | ICD-10-CM

## 2022-08-24 DIAGNOSIS — I480 Paroxysmal atrial fibrillation: Secondary | ICD-10-CM

## 2022-08-24 DIAGNOSIS — I5032 Chronic diastolic (congestive) heart failure: Secondary | ICD-10-CM

## 2022-08-24 DIAGNOSIS — Z7901 Long term (current) use of anticoagulants: Secondary | ICD-10-CM

## 2022-08-24 DIAGNOSIS — D6859 Other primary thrombophilia: Secondary | ICD-10-CM

## 2022-08-24 DIAGNOSIS — Z955 Presence of coronary angioplasty implant and graft: Secondary | ICD-10-CM

## 2022-08-24 DIAGNOSIS — E785 Hyperlipidemia, unspecified: Secondary | ICD-10-CM

## 2022-08-24 DIAGNOSIS — G4733 Obstructive sleep apnea (adult) (pediatric): Secondary | ICD-10-CM

## 2022-08-24 NOTE — Telephone Encounter (Signed)
Rx request sent to pharmacy.  

## 2022-08-24 NOTE — Progress Notes (Signed)
Cardiology Office Note:    Date:  08/24/2022   ID:  Dean Morris, DOB 1956/01/11, MRN [id number]  PCP:  [name], DO  Cardiologist:  Buford Dresser, MD  Referring MD: [name], DO   CC: follow-up  History of Present Illness:    Dean Morris is a 66 y.o. male with a hx of paroxysmal atrial fibrillation, coronary artery disease s/p stent in distant past, hypertension, hyperlipidemia who is seen for follow-up. I initially met him 08/17/2020 as a new consult at the request of [name], DO for the evaluation and management of atrial fibrillation, coronary artery disease.  CV history: Last seen 10/09/2016 at [facility] cardiology. Noted to have OM stent in 2013. Was in NSR but noted prior history of afib, only on aspirin. Per Care Everywhere, had afib RVR initially diagnosed 09/2016 when he was very ill.   At a prior appointment he complained of chest pressure resolved with sitting upright. He described the feeling as a "band-like tightness."  At his last visit, he continued to struggle with nocturia, orthopnea, and PND, also resulting in insomnia. He completed a sleep study and was waiting on the CPAP. He felt like he was in atrial fibrillation a couple times, once while sitting that lasted for 5 seconds. He complained of minor weight gain and stated his abdomen appeared more distended on one side.  Since his last visit he received a BiPAP machine but had to return it due to claustrophobia.  Today, he is struggling with sinus drainage making it difficult to breathe at night. One morning he woke up with blood on his pillow. Additionally he complains of tinnitus and a minor productive cough, but no fevers or chills. His symptoms have been worse since he previously broke his nose. Currently he is using Flonase.  Also he complains of chest tightness across his central chest with activity. His more strenuous activities lately have been getting in and out of vans at his clinic visits.  He will walk to the mailbox, but becomes short of breath. While walking around the park he begins to breathe heavily if he walks too far. Occasionally he is aware of wheezing.   Lately he is regularly drinking nutritional drinks such as Ensure. He also takes a vitamin D supplement.  He denies any recent arrhythmias.  He denies any palpitations, or peripheral edema. No lightheadedness, headaches, syncope, or PND.    Past Medical History:  Diagnosis Date   Allergy    seasonal allergies   Anxiety    Arthritis    bilateral hands   Asthma    uses inhaler   CAD (coronary artery disease)    stent   Cholelithiasis    Chronic kidney disease    CKD-   Depression    GERD (gastroesophageal reflux disease)    not on meds at this time   Gout    Hyperlipidemia    on meds   Hypertension    not on meds at this time   Myocardial infarction [facility]) 2011   hx of   Paroxysmal A-fib [facility])     Past Surgical History:  Procedure Laterality Date   BLADDER REPAIR  1990's   unknown procedure   CARDIOVERSION N/A 11/03/2021   Procedure: CARDIOVERSION;  Surgeon: Jerline Pain, MD;  Location: [facility];  Service: Cardiovascular;  Laterality: N/A;   CORONARY ANGIOPLASTY WITH STENT PLACEMENT     TONSILLECTOMY      Current Medications: Current Outpatient Medications on File Prior to  Visit  Medication Sig   acetaminophen (TYLENOL) 500 MG tablet Take 1,000 mg by mouth every 8 (eight) hours as needed for moderate pain.   allopurinol (ZYLOPRIM) 100 MG tablet TAKE 1 TABLET BY MOUTH DAILY   amiodarone (PACERONE) 200 MG tablet Take 1 tablet (200 mg total) by mouth daily.   amLODipine (NORVASC) 5 MG tablet Take 1 tablet (5 mg total) by mouth at bedtime.   apixaban (ELIQUIS) 5 MG TABS tablet Take 1 tablet (5 mg total) by mouth 2 (two) times daily.   atorvastatin (LIPITOR) 40 MG tablet TAKE 1 TABLET (40 MG TOTAL) BY MOUTH DAILY.   ePHEDrine-guaiFENesin (PRIMATENE ASTHMA) 12.5-200 MG TABS Take 1 spray by  mouth in the morning and at bedtime.   fluticasone (FLONASE) 50 MCG/ACT nasal spray Place 2 sprays into both nostrils daily.   furosemide (LASIX) 40 MG tablet Take 40 mg by mouth daily.   loratadine (CLARITIN) 10 MG tablet Take 1 tablet (10 mg total) by mouth daily.   losartan (COZAAR) 100 MG tablet Take 1 tablet (100 mg total) by mouth at bedtime.   metoprolol tartrate (LOPRESSOR) 100 MG tablet Take 1 tablet (100 mg total) by mouth 2 (two) times daily.   nitroGLYCERIN (NITROSTAT) 0.4 MG SL tablet Place 1 tablet (0.4 mg total) under the tongue every 5 (five) minutes as needed for chest pain.   Saline (ARY NASAL MIST ALLERGY/SINUS) 2.65 % SOLN Place 1 spray into the nose daily as needed (allergies).   No current facility-administered medications on file prior to visit.     Allergies:   Patient has no known allergies.   Social History   Tobacco Use   Smoking status: Former    Packs/day: 0.10    Years: 30.00    Total pack years: 3.00    Types: Cigarettes    Quit date: 09/11/2011    Years since quitting: 10.9   Smokeless tobacco: Former    Quit date: 2021  Vaping Use   Vaping Use: Never used  Substance Use Topics   Alcohol use: Yes    Comment: 2-3 x per month   Drug use: No    Family History: family history includes Colon cancer (age of onset: 68) in his maternal grandmother; Heart failure in his brother and mother. There is no history of Esophageal cancer, Rectal cancer, or Stomach cancer.  ROS:   Please see the history of present illness. (+) Sinus drainage (+) Shortness of breath (+) Chest tightness (+) Productive cough (+) Wheezing (+) Tinnitus All other systems are reviewed and negative.    EKGs/Labs/Other Studies Reviewed:    The following studies were reviewed today:  Echo 12/11/2021: IMPRESSIONS    1. Left ventricular ejection fraction, by estimation, is 60 to 65%. The  left ventricle has normal function. The left ventricle has no regional  wall motion  abnormalities. There is moderate left ventricular hypertrophy.  Left ventricular diastolic  parameters are indeterminate.   2. Right ventricular systolic function is normal. The right ventricular  size is normal. There is normal pulmonary artery systolic pressure. The  estimated right ventricular systolic pressure is 66.0 mmHg.   3. Left atrial size was mildly dilated.   4. The mitral valve is normal in structure. No evidence of mitral valve  regurgitation. No evidence of mitral stenosis.   5. The aortic valve is normal in structure. Aortic valve regurgitation is  trivial. No aortic stenosis is present.   6. Aortic dilatation noted. There is mild dilatation of  the aortic root,  measuring 40 mm. There is mild dilatation of the ascending aorta,  measuring 41 mm.   7. The inferior vena cava is dilated in size with >50% respiratory  variability, suggesting right atrial pressure of 8 mmHg.   Comparison(s): No significant change from prior study. Prior images  reviewed side by side. EF 60%.   Bilateral LE Venous DVT 07/27/2021: Summary:  RIGHT:  - There is no evidence of deep vein thrombosis in the lower extremity.  - No cystic structure found in the popliteal fossa.     LEFT:  - No evidence of common femoral vein obstruction.   Echo 04/10/20 1. Left ventricular ejection fraction, by estimation, is 60 to 65%. The  left ventricle has normal function. The left ventricle has no regional  wall motion abnormalities. There is severe left ventricular hypertrophy.  Left ventricular diastolic parameters   are indeterminate.   2. Right ventricular systolic function is normal. The right ventricular  size is normal. There is normal pulmonary artery systolic pressure.   3. The mitral valve is normal in structure. No evidence of mitral valve  regurgitation. No evidence of mitral stenosis.   4. The aortic valve is tricuspid. Aortic valve regurgitation is not  visualized. No aortic stenosis is  present.   5. Aortic dilatation noted. 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   6. The inferior vena cava is normal in size with greater than 50%  respiratory variability, suggesting right atrial pressure of 3 mmHg.   EKG:  EKG is personally reviewed.  08/24/2022: sinus bradycardia at 52 bpm, RBBB, nonspecific st pattern  02/26/2022:  sinus bradycardia at 48 bpm, RBBB 12/13/2021: afib at 77 bpm, RBBB 09/29/2021: atrial fibrillation RVR at 118 bpm. IVCD 08/17/2020: NSR at 75 bpm, IVCD, nonspecific ST-T wave pattern. Similar to ECG from 04/11/20. Of note, the ECG from 04/11/20 is labeled atrial fibrillation, but it is NSR with PVC  Recent Labs: 10/30/2021: Hemoglobin 15.2; Platelets 296 11/24/2021: ALT 20; BNP 289.5; BUN 24; Creatinine, Ser 1.31; Potassium 5.1; Sodium 143; TSH 2.290   Recent Lipid Panel    Component Value Date/Time   CHOL 132 08/31/2021 1505   TRIG 149 08/31/2021 1505   HDL 27 (L) 08/31/2021 1505   CHOLHDL 4.9 08/31/2021 1505   LDLCALC 79 08/31/2021 1505    Physical Exam:    VS:  BP 118/80 (BP Location: Right Arm, Patient Position: Sitting, Cuff Size: Large)   Pulse (!) 52   Ht '5\' 6"'  (1.676 m)   Wt 260 lb 6.4 oz (118.1 kg)   BMI 42.03 kg/m     Wt Readings from Last 3 Encounters:  08/24/22 260 lb 6.4 oz (118.1 kg)  05/28/22 256 lb 9.6 oz (116.4 kg)  02/26/22 241 lb 14.4 oz (109.7 kg)    GEN: Well nourished, well developed in no acute distress HEENT: Normal, moist mucous membranes NECK: No JVD appreciated CARDIAC: slow, regular rhythm, normal S1 and S2, no rubs or gallops. No murmur.  VASCULAR: Radial and DP pulses 2+ bilaterally. No carotid bruits RESPIRATORY:  clear bilaterally, no rales, wheezing or rhonchi  ABDOMEN: Soft, non-tender, non-distended MUSCULOSKELETAL:  Ambulates independently SKIN: Warm and dry, no significant LE edema NEUROLOGIC:  Alert and oriented x 3. No focal neuro deficits noted. PSYCHIATRIC:  Normal affect     ASSESSMENT:    1. Paroxysmal A-fib ([place])   2. Hypercoagulable state ([place])   3. Long term current use of anticoagulant  4. History of coronary angioplasty with insertion of stent   5. Hyperlipidemia LDL goal <70   6. Essential hypertension   7. Chronic diastolic heart failure ([place])   8. Obstructive sleep apnea (adult) (pediatric)    PLAN:    Chest pain, congestion, URI: suspect these are all related today, but if chest tightness persists beyond the URI he will contact me.  Chronic diastolic heart failure -appears euvolemic today  paroxysmal atrial fibrillation Secondary hypercoagulable state CHA2DS2/VAS Stroke Risk Points=3 -continue long term anticoagulation with apixaban -did not maintain sinus rhythm after cardioversion 10/2021 beyond a week -started on amiodarone 11/24/21 -continued on metoprolol tartrate 100 mg BID. He is asymptomatic at current heart rate, but if he has lightheadedness would cut this back to 50 mg BID -given age, would like to avoid amiodarone long term, but will continue in the short term for now.  History of CAD s/p OM stent in 2013 Mixed hyperlpidemia -continue aspirin 81 mg, if bleeding issues in the future could drop aspirin and continue DOAC alone -continue atorvastatin 40 mg daily. -normal EF as per echo -reviewed red flag warning signs that need immediate medical attention  Hypertension: -goal <130/80, near goal today -continue amlodipine (watch for worsening gout, had to stop previously) and losartan -metoprolol as above  Cardiac risk counseling and prevention recommendations: -recommend heart healthy/Mediterranean diet, with whole grains, fruits, vegetable, fish, lean meats, nuts, and olive oil. Limit salt. -recommend moderate walking, 3-5 times/week for 30-50 minutes each session. Aim for at least 150 minutes.week. Goal should be pace of 3 miles/hours, or walking 1.5 miles in 30 minutes -recommend avoidance of tobacco products. Avoid  excess alcohol.  Plan for follow up: 6 months or sooner as needed  Buford Dresser, MD, PhD, [facility] at [facility] at [facility] [address] [place], [place] [address] [phone]   Medication Adjustments/Labs and Tests Ordered: Current medicines are reviewed at length with the patient today.  Concerns regarding medicines are outlined above.   Orders Placed This Encounter  Procedures   EKG 12-Lead   No orders of the defined types were placed in this encounter.  Patient Instructions  Medication Instructions:  Continue current medications  *If you need a refill on your cardiac medications before your next appointment, please call your pharmacy*   Lab Work: None Ordered   Testing/Procedures: None Ordered   Follow-Up: At [facility], you and your health needs are our priority.  As part of our continuing mission to provide you with exceptional heart care, we have created designated Provider Care Teams.  These Care Teams include your primary Cardiologist (physician) and Advanced Practice Providers (APPs -  Physician Assistants and Nurse Practitioners) who all work together to provide you with the care you need, when you need it.  We recommend signing up for the patient portal called "MyChart".  Sign up information is provided on this After Visit Summary.  MyChart is used to connect with patients for Virtual Visits (Telemedicine).  Patients are able to view lab/test results, encounter notes, upcoming appointments, etc.  Non-urgent messages can be sent to your provider as well.   To learn more about what you can do with MyChart, go to [email or web address].    Your next appointment:   6 month(s)  The format for your next appointment:   In Person  Provider:   Buford Dresser, MD    Other Instructions  I,Mathew Stumpf,acting as a Education administrator  for [employer], MD.,have documented all relevant documentation on the behalf of Buford Dresser, MD,as directed by  Buford Dresser, MD while in the presence of Buford Dresser, MD.  I, Buford Dresser, MD, have reviewed all documentation for this visit. The documentation on 08/24/22 for the exam, diagnosis, procedures, and orders are all accurate and complete.   Signed, Buford Dresser, MD PhD 08/24/2022     [place]

## 2022-08-24 NOTE — Patient Instructions (Signed)
Medication Instructions:  Continue current medications  *If you need a refill on your cardiac medications before your next appointment, please call your pharmacy*   Lab Work: None Ordered   Testing/Procedures: None Ordered   Follow-Up: At [facility], you and your health needs are our priority.  As part of our continuing mission to provide you with exceptional heart care, we have created designated Provider Care Teams.  These Care Teams include your primary Cardiologist (physician) and Advanced Practice Providers (APPs -  Physician Assistants and Nurse Practitioners) who all work together to provide you with the care you need, when you need it.  We recommend signing up for the patient portal called "MyChart".  Sign up information is provided on this After Visit Summary.  MyChart is used to connect with patients for Virtual Visits (Telemedicine).  Patients are able to view lab/test results, encounter notes, upcoming appointments, etc.  Non-urgent messages can be sent to your provider as well.   To learn more about what you can do with MyChart, go to [email or web address].    Your next appointment:   6 month(s)  The format for your next appointment:   In Person  Provider:   Bridgette Christopher, MD    Other Instructions          

## 2022-10-09 ENCOUNTER — Encounter (HOSPITAL_BASED_OUTPATIENT_CLINIC_OR_DEPARTMENT_OTHER): Payer: Self-pay | Admitting: Cardiology

## 2022-10-14 ENCOUNTER — Encounter (HOSPITAL_BASED_OUTPATIENT_CLINIC_OR_DEPARTMENT_OTHER): Payer: Self-pay | Admitting: Cardiology

## 2022-11-01 ENCOUNTER — Other Ambulatory Visit (HOSPITAL_BASED_OUTPATIENT_CLINIC_OR_DEPARTMENT_OTHER): Payer: Self-pay | Admitting: Family

## 2022-11-01 DIAGNOSIS — E785 Hyperlipidemia, unspecified: Secondary | ICD-10-CM

## 2022-11-01 DIAGNOSIS — I25118 Atherosclerotic heart disease of native coronary artery with other forms of angina pectoris: Secondary | ICD-10-CM

## 2022-11-01 NOTE — Telephone Encounter (Signed)
Rx request sent to pharmacy.  

## 2022-11-06 ENCOUNTER — Other Ambulatory Visit: Payer: Self-pay | Admitting: *Deleted

## 2022-11-06 DIAGNOSIS — I10 Essential (primary) hypertension: Secondary | ICD-10-CM

## 2022-11-06 MED ORDER — AMLODIPINE BESYLATE 5 MG PO TABS: 5.0000 mg | ORAL_TABLET | Freq: Every evening | ORAL | 1 refills | Status: DC

## 2022-11-07 ENCOUNTER — Telehealth: Payer: Self-pay | Admitting: Cardiology

## 2022-11-07 DIAGNOSIS — I480 Paroxysmal atrial fibrillation: Secondary | ICD-10-CM

## 2022-11-07 MED ORDER — APIXABAN 5 MG PO TABS: 5.0000 mg | ORAL_TABLET | Freq: Two times a day (BID) | ORAL | 1 refills | Status: DC

## 2022-11-07 NOTE — Telephone Encounter (Signed)
*  STAT* If patient is at the pharmacy, call can be transferred to refill team.   1. Which medications need to be refilled? (please list name of each medication and dose if known)   apixaban (ELIQUIS) 5 MG TABS tablet    2. Which pharmacy/location (including street and city if local pharmacy) is medication to be sent to?    [id number] - [place], [place] [address] Phone: [phone]  Fax: [phone]      3. Do they need a 30 day or 90 day supply? [place]

## 2022-11-07 NOTE — Telephone Encounter (Signed)
Eliquis '5mg'$  refill request received. Patient is 67 years old, weight-118.1kg, Crea-1.31 on 11/24/21, Diagnosis-Afib, and last seen by Dr. Harrell Gave on 08/24/22. Dose is appropriate based on dosing criteria. Will send in refill to requested pharmacy.    Called pt to confirm where to send refill and he confirmed [address]

## 2022-12-10 ENCOUNTER — Telehealth: Payer: Self-pay | Admitting: Cardiology

## 2022-12-10 NOTE — Telephone Encounter (Signed)
Pt states he had an echo done at [address] in [place], on 11/29/22 and they told him his heart is enlarged. Pt unsure if they have sent those results over to Dr. Harrell Gave yet. Pt is scheduled for another echo 12/24/22, unsure if he needs to keep this one as well. He also wants to know is his heart being enlarged something to be concerned about. Please advise.

## 2022-12-10 NOTE — Telephone Encounter (Signed)
Returned call to patient, he had this done at [facility] doctors office. Will send fax request for records for review by Dr. Harrell Gave.

## 2022-12-11 NOTE — Telephone Encounter (Signed)
Confirmation message received.

## 2022-12-14 ENCOUNTER — Other Ambulatory Visit (HOSPITAL_BASED_OUTPATIENT_CLINIC_OR_DEPARTMENT_OTHER): Payer: Self-pay

## 2022-12-15 ENCOUNTER — Other Ambulatory Visit: Payer: Self-pay | Admitting: Student

## 2022-12-15 DIAGNOSIS — I10 Essential (primary) hypertension: Secondary | ICD-10-CM

## 2022-12-16 ENCOUNTER — Other Ambulatory Visit: Payer: Self-pay | Admitting: Student

## 2022-12-17 NOTE — Telephone Encounter (Signed)
Called patient primary care office where he states he had his testing done,    Despite fax confirmation they say they did not receive a recods request. Confirmed fax number, will refax request. Asked that they go ahead and sent Echo report. Gave fax number. Echo report should be coming today.

## 2022-12-18 NOTE — Telephone Encounter (Signed)
Fax received, given to Dr. Cristal Deer for review.

## 2022-12-18 NOTE — Telephone Encounter (Signed)
FYI printed and on your desk

## 2022-12-24 ENCOUNTER — Ambulatory Visit (HOSPITAL_BASED_OUTPATIENT_CLINIC_OR_DEPARTMENT_OTHER): Payer: Medicare PPO

## 2022-12-24 DIAGNOSIS — I480 Paroxysmal atrial fibrillation: Secondary | ICD-10-CM

## 2022-12-25 ENCOUNTER — Telehealth (HOSPITAL_BASED_OUTPATIENT_CLINIC_OR_DEPARTMENT_OTHER): Payer: Self-pay

## 2022-12-25 ENCOUNTER — Other Ambulatory Visit: Payer: Self-pay | Admitting: *Deleted

## 2022-12-25 DIAGNOSIS — I480 Paroxysmal atrial fibrillation: Secondary | ICD-10-CM

## 2022-12-25 LAB — ECHOCARDIOGRAM COMPLETE
Area-P 1/2: 3.48 cm2
Calc EF: 61 %
Est EF: >75
S' Lateral: 2.22 cm
Single Plane A2C EF: 63.5 %
Single Plane A4C EF: 54.3 %

## 2022-12-25 MED ORDER — AMIODARONE HCL 200 MG PO TABS: 200.0000 mg | ORAL_TABLET | Freq: Every day | ORAL | 3 refills | Status: DC

## 2022-12-25 NOTE — Telephone Encounter (Addendum)
Results called to patient who verbalizes understanding!      ----- Message from Alver Sorrow, NP sent at 12/25/2022  1:37 PM EDT ----- Echocardiogram with hyperdynamic pumping function -he may have been a bit dehydrated at the time of his echocardiogram.  Mild dilation of ascending aorta previous 41mm now 42mm. We prevent this from worsening by keeping blood pressure controlled.  Continue current medications. Repeat echocardiogram in one year for monitoring.

## 2022-12-28 ENCOUNTER — Other Ambulatory Visit: Payer: Self-pay

## 2022-12-28 DIAGNOSIS — I480 Paroxysmal atrial fibrillation: Secondary | ICD-10-CM

## 2022-12-28 MED ORDER — AMIODARONE HCL 200 MG PO TABS: 200.0000 mg | ORAL_TABLET | Freq: Every day | ORAL | 0 refills | Status: DC

## 2023-03-01 ENCOUNTER — Ambulatory Visit (HOSPITAL_BASED_OUTPATIENT_CLINIC_OR_DEPARTMENT_OTHER): Payer: Medicare PPO | Admitting: Cardiology

## 2023-03-01 ENCOUNTER — Encounter (HOSPITAL_BASED_OUTPATIENT_CLINIC_OR_DEPARTMENT_OTHER): Payer: Self-pay | Admitting: Cardiology

## 2023-03-01 VITALS — BP 128/78 | HR 51 | Ht 66.0 in | Wt 259.7 lb

## 2023-03-01 DIAGNOSIS — I10 Essential (primary) hypertension: Secondary | ICD-10-CM

## 2023-03-01 DIAGNOSIS — I480 Paroxysmal atrial fibrillation: Secondary | ICD-10-CM

## 2023-03-01 DIAGNOSIS — D6859 Other primary thrombophilia: Secondary | ICD-10-CM

## 2023-03-01 DIAGNOSIS — Z7901 Long term (current) use of anticoagulants: Secondary | ICD-10-CM | POA: Diagnosis not present

## 2023-03-01 DIAGNOSIS — Z955 Presence of coronary angioplasty implant and graft: Secondary | ICD-10-CM | POA: Diagnosis not present

## 2023-03-01 DIAGNOSIS — E785 Hyperlipidemia, unspecified: Secondary | ICD-10-CM

## 2023-03-01 DIAGNOSIS — I5032 Chronic diastolic (congestive) heart failure: Secondary | ICD-10-CM

## 2023-03-01 NOTE — Patient Instructions (Addendum)
Medication Instructions:  Stop amiodarone. See if this helps with your stomach symptoms. If you feel that the afib comes back, call us.  *If you need a refill on your cardiac medications before your next appointment, please call your pharmacy*  Lab Work: NONE  Testing/Procedures: NONE  Follow-Up: 05/28/2023 10:30 am with Ronn Melena NP

## 2023-03-01 NOTE — Progress Notes (Signed)
Cardiology Office Note:  .   Date:  03/01/2023  ID:  Dean Morris, DOB May 04, 1956, MRN [id number] PCP: Dean Heir, DO  [facility] Providers Cardiologist:  Dean Red, MD {  History of Present Illness: .   Dean Morris is a 67 y.o. male  with a hx of paroxysmal atrial fibrillation, coronary artery disease s/p stent in distant past, hypertension, hyperlipidemia who is seen for follow-up. I initially met him 08/17/2020 as a new consult at the request of Dean Mattocks, DO for the evaluation and management of atrial fibrillation, coronary artery disease.   CV history: Last seen 10/09/2016 at [facility] cardiology. Noted to have OM stent in 2013. Was in NSR but noted prior history of afib, only on aspirin. Per Care Everywhere, had afib RVR initially diagnosed 09/2016 when he was very ill.   Today: Overall feeling well. Reviewed recent echo, ascending aorta measured at 42 mm (was 41 mm), reviewed how we watch this and how blood pressure control can help with this. Will repeat echo in 1 year.   ROS positive for stomach always feeling tight. Has occasional hemorrhoidal blood but no significant hematochezia. No melena. Had a colonoscopy about a year ago.  ROS: Denies chest pain, shortness of breath at rest or with normal exertion. No PND, orthopnea, LE edema or unexpected weight gain. No syncope or palpitations. ROS otherwise negative except as noted.   Studies Reviewed: Marland Kitchen    EKG:     not ordered today  Physical Exam:   VS:  BP 128/78   Pulse (!) 51   Ht 5\' 6"  (1.676 m)   Wt 259 lb 11.2 oz (117.8 kg)   SpO2 96%   BMI 41.92 kg/m    Wt Readings from Last 3 Encounters:  03/01/23 259 lb 11.2 oz (117.8 kg)  08/24/22 260 lb 6.4 oz (118.1 kg)  05/28/22 256 lb 9.6 oz (116.4 kg)    GEN: Well nourished, well developed in no acute distress HEENT: Normal, moist mucous membranes NECK: No JVD CARDIAC: regular rhythm, normal S1 and S2, no rubs or gallops. No murmur. VASCULAR: Radial  and DP pulses 2+ bilaterally. No carotid bruits RESPIRATORY:  Clear to auscultation without rales, wheezing or rhonchi  ABDOMEN: Soft, non-tender, non-distended MUSCULOSKELETAL:  Ambulates independently SKIN: Warm and dry, no edema NEUROLOGIC:  Alert and oriented x 3. No focal neuro deficits noted. PSYCHIATRIC:  Normal affect    ASSESSMENT AND PLAN: .   Chronic diastolic heart failure -appears euvolemic today   paroxysmal atrial fibrillation Secondary hypercoagulable state CHA2DS2/VAS Stroke Risk Points=3 -continue long term anticoagulation with apixaban -did not maintain sinus rhythm after cardioversion 10/2021 beyond a week -started on amiodarone 11/24/21. Given age, would like to avoid amiodarone long term. We discussed options today. After shared decision making, will stop amiodarone to see if GI symptoms improve. If back in afib on follow up, need to discuss ablation vs. Antiarrhythmic. -continued on metoprolol tartrate 100 mg BID. He is asymptomatic at current heart rate, but if he has lightheadedness would cut this back to 50 mg BID   History of CAD s/p OM stent in 2013 Mixed hyperlpidemia -has some hemorrhoidal bleeding, on apixaban but not aspirin -continue atorvastatin 40 mg daily. -normal EF as per echo -reviewed Morris flag warning signs that need immediate medical attention   Hypertension: -goal <130/80 -continue amlodipine (watch for worsening gout, had to stop previously) and losartan -metoprolol as above  Ascending aortic aneurysm -measured 42 mm on echo (was 41 mm  prior), repeat 1 year.  CV risk counseling and prevention -recommend heart healthy/Mediterranean diet, with whole grains, fruits, vegetable, fish, lean meats, nuts, and olive oil. Limit salt. -recommend moderate walking, 3-5 times/week for 30-50 minutes each session. Aim for at least 150 minutes.week. Goal should be pace of 3 miles/hours, or walking 1.5 miles in 30 minutes -recommend avoidance of tobacco  products. Avoid excess alcohol.  Dispo: 3 mos with me or Dean Shields, NP  Signed, Dean Red, MD   Dean Red, MD, PhD, [facility]  [facility]  [place]  [facility] at [facility] at [facility] [address], [address] [place], [place] [address] [phone]

## 2023-03-20 ENCOUNTER — Telehealth: Payer: Self-pay | Admitting: Cardiology

## 2023-03-20 DIAGNOSIS — I10 Essential (primary) hypertension: Secondary | ICD-10-CM

## 2023-03-20 MED ORDER — LOSARTAN POTASSIUM 100 MG PO TABS: ORAL_TABLET | ORAL | 3 refills | Status: DC

## 2023-03-20 NOTE — Telephone Encounter (Signed)
*  STAT* If patient is at the pharmacy, call can be transferred to refill team.   1. Which medications need to be refilled? (please list name of each medication and dose if known) losartan (COZAAR) 100 MG tablet   2. Which pharmacy/location (including street and city if local pharmacy) is medication to be sent to? [id number] - [place], [place] - [address]   3. Do they need a 30 day or 90 day supply? 90

## 2023-03-22 ENCOUNTER — Encounter: Payer: Self-pay | Admitting: Cardiology

## 2023-03-22 NOTE — Telephone Encounter (Signed)
Error

## 2023-04-21 ENCOUNTER — Other Ambulatory Visit: Payer: Self-pay | Admitting: Cardiology

## 2023-04-21 ENCOUNTER — Other Ambulatory Visit: Payer: Self-pay | Admitting: Student

## 2023-04-21 DIAGNOSIS — I10 Essential (primary) hypertension: Secondary | ICD-10-CM

## 2023-04-21 DIAGNOSIS — I480 Paroxysmal atrial fibrillation: Secondary | ICD-10-CM

## 2023-04-22 NOTE — Telephone Encounter (Signed)
Prescription refill request for Eliquis received. Indication:afib Last office visit:6/24 ZOX:WRUEA labs Age: 67 Weight:117.8  kg  Prescription refilled

## 2023-05-06 NOTE — Telephone Encounter (Signed)
*  STAT* If patient is at the pharmacy, call can be transferred to refill team.   1. Which medications need to be refilled? (please list name of each medication and dose if known) Atorvastatin   2. Would you like to learn more about the convenience, safety, & potential cost savings by using the [facility]?   3. Are you open to using the [facility] (Type [facility].   4. Which pharmacy/location (including street and city if local pharmacy) is medication to be sent to?     [facility]   5. Do they need a 30 day or 90 day supply? 90 days and refills- need this called in today- out of medicine

## 2023-05-15 ENCOUNTER — Other Ambulatory Visit (HOSPITAL_BASED_OUTPATIENT_CLINIC_OR_DEPARTMENT_OTHER): Payer: Self-pay

## 2023-05-21 ENCOUNTER — Other Ambulatory Visit: Payer: Self-pay | Admitting: Cardiology

## 2023-05-21 DIAGNOSIS — I480 Paroxysmal atrial fibrillation: Secondary | ICD-10-CM

## 2023-05-21 NOTE — Telephone Encounter (Signed)
Rx request sent to pharmacy.  

## 2023-05-22 ENCOUNTER — Ambulatory Visit (INDEPENDENT_AMBULATORY_CARE_PROVIDER_SITE_OTHER): Payer: Medicare HMO

## 2023-05-22 VITALS — Ht 66.0 in | Wt 259.0 lb

## 2023-05-22 DIAGNOSIS — Z0000 Encounter for general adult medical examination without abnormal findings: Secondary | ICD-10-CM | POA: Diagnosis not present

## 2023-05-22 NOTE — Patient Instructions (Signed)
Mr. Dean Morris , Thank you for taking time to come for your Medicare Wellness Visit. I appreciate your ongoing commitment to your health goals. Please review the following plan we discussed and let me know if I can assist you in the future.   Referrals/Orders/Follow-Ups/Clinician Recommendations: Aim for 30 minutes of exercise or brisk walking, 6-8 glasses of water, and 5 servings of fruits and vegetables each day.  This is a list of the screening recommended for you and due dates:  Health Maintenance  Topic Date Due   COVID-19 Vaccine (1) Never done   DTaP/Tdap/Td vaccine (1 - Tdap) Never done   Zoster (Shingles) Vaccine (1 of 2) Never done   Colon Cancer Screening  Never done   Pneumonia Vaccine (2 of 2 - PCV) 03/09/2020   Flu Shot  04/11/2023   Medicare Annual Wellness Visit  05/21/2024   Hepatitis C Screening  Completed   HPV Vaccine  Aged Out    Advanced directives: (ACP Link)Information on Advanced Care Planning can be found at [facility] Advance Health Care Directives Advance Health Care Directives ([email or web address]   Next Medicare Annual Wellness Visit scheduled for next year: Yes

## 2023-05-22 NOTE — Progress Notes (Signed)
Subjective:   Dean Morris is a 67 y.o. male who presents for an Initial Medicare Annual Wellness Visit.  Visit Complete: Virtual  I connected with  Dean Morris on 05/22/23 by a audio enabled telemedicine application and verified that I am speaking with the correct person using two identifiers.  Patient Location: Home  Provider Location: Home Office  I discussed the limitations of evaluation and management by telemedicine. The patient expressed understanding and agreed to proceed.  Vital Signs: Because this visit was a virtual/telehealth visit, some criteria may be missing or patient reported. Any vitals not documented were not able to be obtained and vitals that have been documented are patient reported.   Review of Systems     Cardiac Risk Factors include: advanced age (>74men, >35 women);hypertension;male gender;dyslipidemia     Objective:    Today's Vitals   05/22/23 1420  Weight: 259 lb (117.5 kg)  Height: 5\' 6"  (1.676 m)   Body mass index is 41.8 kg/m.     05/22/2023    2:26 PM 05/28/2022    1:34 PM 02/21/2022    9:19 PM 02/15/2022   10:37 AM 11/03/2021    8:37 AM 09/14/2021    2:51 PM 07/22/2020    1:57 PM  Advanced Directives  Does Patient Have a Medical Advance Directive? No No No No No No No  Would patient like information on creating a medical advance directive? Yes (MAU/Ambulatory/Procedural Areas - Information given)  No - Patient declined No - Patient declined  No - Patient declined No - Patient declined    Current Medications (verified) Outpatient Encounter Medications as of 05/22/2023  Medication Sig   acetaminophen (TYLENOL) 500 MG tablet Take 1,000 mg by mouth every 8 (eight) hours as needed for moderate pain.   allopurinol (ZYLOPRIM) 100 MG tablet TAKE 1 TABLET BY MOUTH DAILY   amLODipine (NORVASC) 5 MG tablet TAKE 1 TABLET(5 MG) BY MOUTH AT BEDTIME   apixaban (ELIQUIS) 5 MG TABS tablet TAKE 1 TABLET(5 MG) BY MOUTH TWICE DAILY   atorvastatin (LIPITOR)  40 MG tablet TAKE 1 TABLET(40 MG) BY MOUTH DAILY   ePHEDrine-guaiFENesin (PRIMATENE ASTHMA) 12.5-200 MG TABS Take 1 spray by mouth in the morning and at bedtime.   fluticasone (FLONASE) 50 MCG/ACT nasal spray Place 2 sprays into both nostrils daily.   furosemide (LASIX) 40 MG tablet Take 40 mg by mouth daily.   loratadine (CLARITIN) 10 MG tablet Take 1 tablet (10 mg total) by mouth daily.   losartan (COZAAR) 100 MG tablet TAKE 1 TABLET(100 MG) BY MOUTH AT BEDTIME   metoprolol tartrate (LOPRESSOR) 100 MG tablet TAKE 1 TABLET(100 MG) BY MOUTH TWICE DAILY   nitroGLYCERIN (NITROSTAT) 0.4 MG SL tablet Place 1 tablet (0.4 mg total) under the tongue every 5 (five) minutes as needed for chest pain.   Saline (ARY NASAL MIST ALLERGY/SINUS) 2.65 % SOLN Place 1 spray into the nose daily as needed (allergies).   No facility-administered encounter medications on file as of 05/22/2023.    Allergies (verified) Patient has no known allergies.   History: Past Medical History:  Diagnosis Date   Allergy    seasonal allergies   Anxiety    Arthritis    bilateral hands   Asthma    uses inhaler   CAD (coronary artery disease)    stent   Cholelithiasis    Chronic kidney disease    CKD-   Depression    GERD (gastroesophageal reflux disease)    not on meds  at this time   Gout    Hyperlipidemia    on meds   Hypertension    not on meds at this time   Myocardial infarction [facility]) 2011   hx of   Paroxysmal A-fib [facility])    Past Surgical History:  Procedure Laterality Date   BLADDER REPAIR  1990's   unknown procedure   CARDIOVERSION N/A 11/03/2021   Procedure: CARDIOVERSION;  Surgeon: Jake Bathe, MD;  Location: [facility];  Service: Cardiovascular;  Laterality: N/A;   CORONARY ANGIOPLASTY WITH STENT PLACEMENT     TONSILLECTOMY     Family History  Problem Relation Age of Onset   Heart failure Mother    Heart failure Brother    Colon cancer Maternal Grandmother 39   Esophageal cancer Neg Hx     Rectal cancer Neg Hx    Stomach cancer Neg Hx    Social History   Socioeconomic History   Marital status: Single    Spouse name: Not on file   Number of children: Not on file   Years of education: Not on file   Highest education level: Not on file  Occupational History   Not on file  Tobacco Use   Smoking status: Former    Current packs/day: 0.00    Average packs/day: 0.1 packs/day for 30.0 years (3.0 ttl pk-yrs)    Types: Cigarettes    Start date: 09/10/1981    Quit date: 09/11/2011    Years since quitting: 11.7   Smokeless tobacco: Former    Quit date: 2021  Vaping Use   Vaping status: Never Used  Substance and Sexual Activity   Alcohol use: Yes    Comment: 2-3 x per month   Drug use: No   Sexual activity: Not on file  Other Topics Concern   Not on file  Social History Narrative   Not on file   Social Determinants of Health   Financial Resource Strain: Low Risk  (05/22/2023)   Overall Financial Resource Strain (CARDIA)    Difficulty of Paying Living Expenses: Not hard at all  Food Insecurity: No Food Insecurity (05/22/2023)   Hunger Vital Sign    Worried About Running Out of Food in the Last Year: Never true    Ran Out of Food in the Last Year: Never true  Transportation Needs: No Transportation Needs (05/22/2023)   PRAPARE - Administrator, Civil Service (Medical): No    Lack of Transportation (Non-Medical): No  Physical Activity: Inactive (05/22/2023)   Exercise Vital Sign    Days of Exercise per Week: 0 days    Minutes of Exercise per Session: 0 min  Stress: No Stress Concern Present (05/22/2023)   [employer] of Occupational Health - Occupational Stress Questionnaire    Feeling of Stress : Not at all  Social Connections: Socially Isolated (05/22/2023)   Social Connection and Isolation Panel [NHANES]    Frequency of Communication with Friends and Family: More than three times a week    Frequency of Social Gatherings with Friends and Family:  Twice a week    Attends Religious Services: Never    Database administrator or Organizations: No    Attends Engineer, structural: Never    Marital Status: Never married    Tobacco Counseling Counseling given: Not Answered   Clinical Intake:  Pre-visit preparation completed: Yes  Pain : No/denies pain     Diabetes: No  How often do you need to have someone  help you when you read instructions, pamphlets, or other written materials from your doctor or pharmacy?: 1 - Never  Interpreter Needed?: No  Information entered by :: Kandis Fantasia LPN   Activities of Daily Living    05/22/2023    2:23 PM  In your present state of health, do you have any difficulty performing the following activities:  Hearing? 0  Vision? 0  Difficulty concentrating or making decisions? 0  Walking or climbing stairs? 0  Dressing or bathing? 0  Doing errands, shopping? 0  Preparing Food and eating ? N  Using the Toilet? N  In the past six months, have you accidently leaked urine? N  Do you have problems with loss of bowel control? N  Managing your Medications? N  Managing your Finances? N  Housekeeping or managing your Housekeeping? N    Patient Care Team: [name], DO as PCP - General (Family Medicine) Jodelle Red, MD as PCP - Cardiology (Cardiology)  Indicate any recent Medical Services you may have received from other than [facility] providers in the past year (date may be approximate).     Assessment:   This is a routine wellness examination for Dean Morris.  Hearing/Vision screen Hearing Screening - Comments:: Hard of hearing; would like for pcp to check ears  Vision Screening - Comments:: No vision problems; will schedule routine eye exam soon     Goals Addressed             This Visit's Progress    Increase physical activity        Depression Screen    05/22/2023    2:25 PM 05/28/2022    1:33 PM 02/15/2022   10:37 AM 12/01/2021    2:57 PM 09/28/2021    10:37 AM 09/14/2021    2:51 PM 08/31/2021    1:58 PM  PHQ 2/9 Scores  PHQ - 2 Score 2 2 2 1 2 2 3   PHQ- 9 Score 6 9 13 5 9 9 8     Fall Risk    05/22/2023    2:27 PM 05/28/2022    1:33 PM 02/15/2022   10:37 AM 09/14/2021    2:51 PM 08/22/2020    2:12 PM  Fall Risk   Falls in the past year? 0 0 0 0 0  Number falls in past yr: 0 0 0 0 0  Injury with Fall? 0  0 0   Risk for fall due to : No Fall Risks      Follow up Falls prevention discussed;Education provided;Falls evaluation completed Falls evaluation completed   Falls evaluation completed    MEDICARE RISK AT HOME: Medicare Risk at Home Any stairs in or around the home?: No If so, are there any without handrails?: No Home free of loose throw rugs in walkways, pet beds, electrical cords, etc?: Yes Adequate lighting in your home to reduce risk of falls?: Yes Life alert?: No Use of a cane, walker or w/c?: No Grab bars in the bathroom?: Yes Shower chair or bench in shower?: No Elevated toilet seat or a handicapped toilet?: No  TIMED UP AND GO:  Was the test performed? No    Cognitive Function:        05/22/2023    2:27 PM  6CIT Screen  What Year? 0 points  What month? 0 points  What time? 0 points  Count back from 20 2 points  Months in reverse 2 points  Repeat phrase 2 points  Total Score 6 points  Immunizations Immunization History  Administered Date(s) Administered   Fluad Quad(high Dose 65+) 07/22/2020, 07/27/2021, 05/28/2022   Influenza,inj,Quad PF,6+ Mos 09/15/2016   Pneumococcal Polysaccharide-23 09/15/2016    TDAP status: Due, Education has been provided regarding the importance of this vaccine. Advised may receive this vaccine at local pharmacy or Health Dept. Aware to provide a copy of the vaccination record if obtained from local pharmacy or Health Dept. Verbalized acceptance and understanding.  Flu Vaccine status: Due, Education has been provided regarding the importance of this vaccine. Advised may  receive this vaccine at local pharmacy or Health Dept. Aware to provide a copy of the vaccination record if obtained from local pharmacy or Health Dept. Verbalized acceptance and understanding.  Pneumococcal vaccine status: Due, Education has been provided regarding the importance of this vaccine. Advised may receive this vaccine at local pharmacy or Health Dept. Aware to provide a copy of the vaccination record if obtained from local pharmacy or Health Dept. Verbalized acceptance and understanding.  Covid-19 vaccine status: Declined, Education has been provided regarding the importance of this vaccine but patient still declined. Advised may receive this vaccine at local pharmacy or Health Dept.or vaccine clinic. Aware to provide a copy of the vaccination record if obtained from local pharmacy or Health Dept. Verbalized acceptance and understanding.  Qualifies for Shingles Vaccine? Yes   Zostavax completed No   Shingrix Completed?: No.    Education has been provided regarding the importance of this vaccine. Patient has been advised to call insurance company to determine out of pocket expense if they have not yet received this vaccine. Advised may also receive vaccine at local pharmacy or Health Dept. Verbalized acceptance and understanding.  Screening Tests Health Maintenance  Topic Date Due   COVID-19 Vaccine (1) Never done   DTaP/Tdap/Td (1 - Tdap) Never done   Zoster Vaccines- Shingrix (1 of 2) Never done   Colonoscopy  Never done   Pneumonia Vaccine 95+ Years old (2 of 2 - PCV) 03/09/2020   INFLUENZA VACCINE  04/11/2023   Medicare Annual Wellness (AWV)  05/21/2024   Hepatitis C Screening  Completed   HPV VACCINES  Aged Out    Health Maintenance  Health Maintenance Due  Topic Date Due   COVID-19 Vaccine (1) Never done   DTaP/Tdap/Td (1 - Tdap) Never done   Zoster Vaccines- Shingrix (1 of 2) Never done   Colonoscopy  Never done   Pneumonia Vaccine 54+ Years old (2 of 2 - PCV)  03/09/2020   INFLUENZA VACCINE  04/11/2023    Colorectal cancer screening:  Declines at this time   Lung Cancer Screening: (Low Dose CT Chest recommended if Age 46-80 years, 20 pack-year currently smoking OR have quit w/in 15years.) does not qualify.   Lung Cancer Screening Referral: n/a  Additional Screening:  Hepatitis C Screening: does qualify; Completed 07/27/21  Vision Screening: Recommended annual ophthalmology exams for early detection of glaucoma and other disorders of the eye. Is the patient up to date with their annual eye exam?  No  Who is the provider or what is the name of the office in which the patient attends annual eye exams? none If pt is not established with a provider, would they like to be referred to a provider to establish care? No .   Dental Screening: Recommended annual dental exams for proper oral hygiene  Community Resource Referral / Chronic Care Management: CRR required this visit?  No   CCM required this visit?  No  Plan:     I have personally reviewed and noted the following in the patient's chart:   Medical and social history Use of alcohol, tobacco or illicit drugs  Current medications and supplements including opioid prescriptions. Patient is not currently taking opioid prescriptions. Functional ability and status Nutritional status Physical activity Advanced directives List of other physicians Hospitalizations, surgeries, and ER visits in previous 12 months Vitals Screenings to include cognitive, depression, and falls Referrals and appointments  In addition, I have reviewed and discussed with patient certain preventive protocols, quality metrics, and best practice recommendations. A written personalized care plan for preventive services as well as general preventive health recommendations were provided to patient.     Kandis Fantasia [place], [place]   [id number]   After Visit Summary: (Pick Up) Due to this being a telephonic visit,  with patients personalized plan was offered to patient and patient has requested to Pick up at office.  Nurse Notes: Would like to have ears checked at upcoming appointment

## 2023-05-23 ENCOUNTER — Ambulatory Visit: Payer: Medicare HMO | Admitting: Student

## 2023-05-23 ENCOUNTER — Encounter: Payer: Self-pay | Admitting: Student

## 2023-05-23 VITALS — BP 148/89 | HR 51 | Ht 66.0 in | Wt 250.4 lb

## 2023-05-23 DIAGNOSIS — I10 Essential (primary) hypertension: Secondary | ICD-10-CM | POA: Diagnosis not present

## 2023-05-23 DIAGNOSIS — E782 Mixed hyperlipidemia: Secondary | ICD-10-CM

## 2023-05-23 DIAGNOSIS — H9193 Unspecified hearing loss, bilateral: Secondary | ICD-10-CM | POA: Diagnosis not present

## 2023-05-23 DIAGNOSIS — Z23 Encounter for immunization: Secondary | ICD-10-CM | POA: Diagnosis not present

## 2023-05-23 MED ORDER — ATORVASTATIN CALCIUM 40 MG PO TABS
40.0000 mg | ORAL_TABLET | Freq: Every day | ORAL | 1 refills | Status: DC
Start: 2023-05-23 — End: 2023-11-14

## 2023-05-23 MED ORDER — AMLODIPINE BESYLATE 10 MG PO TABS
10.0000 mg | ORAL_TABLET | Freq: Every day | ORAL | 3 refills | Status: DC
Start: 2023-05-23 — End: 2024-05-04

## 2023-05-23 NOTE — Assessment & Plan Note (Addendum)
Unfortunately he thought he was supposed to discontinue this after seeing cardiology.  I have corrected him on this.  Restart atorvastatin 40 mg daily.

## 2023-05-23 NOTE — Patient Instructions (Signed)
It was great to see you today! Thank you for choosing [facility] for your primary care.  Today we addressed: I have increased your blood pressure medication for amlodipine to 10 mg.  I have sent in atorvastatin 40 mg for you to restart.  You should always be on this medication.  Please follow-up with me in 1 month for your blood pressure. I have sent a referral to audiology for you to get your hearing tested. You were given the flu and pneumonia vaccine today.  If you haven't already, sign up for My Chart to have easy access to your labs results, and communication with your primary care physician. We are checking some labs today. If they are abnormal, I will call you. If they are normal, I will send you a MyChart message (if it is active) or a letter in the mail. If you do not hear about your labs in the next 2 weeks, please call the office. Return in about 1 month (around 06/22/2023). Please arrive 15 minutes before your appointment to ensure smooth check in process.  We appreciate your efforts in making this happen.  Thank you for allowing me to participate in your care, [name], DO 05/23/2023, 2:54 PM PGY-3, [facility]

## 2023-05-23 NOTE — Assessment & Plan Note (Addendum)
Ears are clear, he does state he is having some difficulty hearing voices in particular male voices despite listening.  I have placed referral to audiology for consideration of hearing aids.  He is amenable to getting hearing aids if he needs them.

## 2023-05-23 NOTE — Addendum Note (Signed)
Addended by: Drusilla Kanner A on: 05/23/2023 05:26 PM   Modules accepted: Orders

## 2023-05-23 NOTE — Progress Notes (Addendum)
  SUBJECTIVE:   CHIEF COMPLAINT / HPI:   Presents today for follow-up.  Has not been seen by me for the last year.  He notes his cardiologist took him off amiodarone and atorvastatin.  Per note review, she did take him off amiodarone however did not take him off atorvastatin.  He endorses some hearing trouble bilaterally and is asked me to check his ears to see if they need to be flushed.  PERTINENT  PMH / PSH: HTN, HLD, CAD s/p stent, paroxysmal A-fib on Eliquis, GERD  OBJECTIVE:  BP (!) 148/89   Pulse (!) 51   Ht 5\' 6"  (1.676 m)   Wt 250 lb 6.4 oz (113.6 kg)   SpO2 98%   BMI 40.42 kg/m  General: Well-appearing, NAD HEENT: Normal TMs without cerumen impaction present bilaterally CV: Normal rhythm, bradycardic, no murmurs appreciated Pulm: CTAB, normal WOB  ASSESSMENT/PLAN:   Assessment & Plan Essential hypertension BP: (!) 148/89 today. Poorly controlled. Goal of <130/80. Continue to work on healthy dietary habits and exercise. Follow up in 1 month.  Recheck BMP today.   Medication regimen: Lopressor 100 mg twice daily, losartan 100 mg daily, increase amlodipine to 10 mg daily Hearing trouble, bilateral Ears are clear, he does state he is having some difficulty hearing voices in particular male voices despite listening.  I have placed referral to audiology for consideration of hearing aids.  He is amenable to getting hearing aids if he needs them. Mixed hyperlipidemia Unfortunately he thought he was supposed to discontinue this after seeing cardiology.  I have corrected him on this.  Restart atorvastatin 40 mg daily. Flu and pneumonia vaccine provided today.  He would like to discuss anxiety at next visit.  Return in about 1 month (around 06/22/2023) for HTN follow-up. [name], DO 05/23/2023, 3:10 PM PGY-3, [facility]

## 2023-05-23 NOTE — Assessment & Plan Note (Addendum)
BP: (!) 148/89 today. Poorly controlled. Goal of <130/80. Continue to work on healthy dietary habits and exercise. Follow up in 1 month.  Recheck BMP today.   Medication regimen: Lopressor 100 mg twice daily, losartan 100 mg daily, increase amlodipine to 10 mg daily

## 2023-05-24 LAB — BASIC METABOLIC PANEL
BUN/Creatinine Ratio: 14 (ref 10–24)
BUN: 20 mg/dL (ref 8–27)
CO2: 21 mmol/L (ref 20–29)
Calcium: 9.1 mg/dL (ref 8.6–10.2)
Chloride: 106 mmol/L (ref 96–106)
Creatinine, Ser: 1.46 mg/dL — ABNORMAL HIGH (ref 0.76–1.27)
Glucose: 88 mg/dL (ref 70–99)
Potassium: 4.4 mmol/L (ref 3.5–5.2)
Sodium: 143 mmol/L (ref 134–144)
eGFR: 52 mL/min/{1.73_m2} — ABNORMAL LOW (ref >59)

## 2023-05-27 ENCOUNTER — Encounter: Payer: Self-pay | Admitting: Student

## 2023-05-28 ENCOUNTER — Encounter (HOSPITAL_BASED_OUTPATIENT_CLINIC_OR_DEPARTMENT_OTHER): Payer: Self-pay | Admitting: Family

## 2023-05-28 ENCOUNTER — Ambulatory Visit (HOSPITAL_BASED_OUTPATIENT_CLINIC_OR_DEPARTMENT_OTHER): Payer: Medicare HMO | Admitting: Family

## 2023-05-28 VITALS — BP 138/94 | HR 49 | Ht 66.0 in | Wt 264.3 lb

## 2023-05-28 DIAGNOSIS — I480 Paroxysmal atrial fibrillation: Secondary | ICD-10-CM

## 2023-05-28 DIAGNOSIS — E785 Hyperlipidemia, unspecified: Secondary | ICD-10-CM

## 2023-05-28 DIAGNOSIS — D6859 Other primary thrombophilia: Secondary | ICD-10-CM

## 2023-05-28 DIAGNOSIS — I10 Essential (primary) hypertension: Secondary | ICD-10-CM | POA: Diagnosis not present

## 2023-05-28 DIAGNOSIS — I25118 Atherosclerotic heart disease of native coronary artery with other forms of angina pectoris: Secondary | ICD-10-CM

## 2023-05-28 DIAGNOSIS — I5032 Chronic diastolic (congestive) heart failure: Secondary | ICD-10-CM

## 2023-05-28 LAB — CBC
Hematocrit: 41.7 % (ref 37.5–51.0)
Hemoglobin: 13.7 g/dL (ref 13.0–17.7)
MCH: 30.8 pg (ref 26.6–33.0)
MCHC: 32.9 g/dL (ref 31.5–35.7)
MCV: 94 fL (ref 79–97)
Platelets: 208 10*3/uL (ref 150–450)
RBC: 4.45 x10E6/uL (ref 4.14–5.80)
RDW: 12.8 % (ref 11.6–15.4)
WBC: 8.9 10*3/uL (ref 3.4–10.8)

## 2023-05-28 NOTE — Progress Notes (Signed)
Cardiology Office Note:  .   Date:  05/31/2023  ID:  Dean Morris, DOB May 02, 1956, MRN [id number] PCP: [name], DO  [facility] Providers Cardiologist:  Jodelle Red, MD    History of Present Illness: .   Dean Morris is a 67 y.o. male with hx of PAF, CAD s/p OM stent 2013, HTN, HLD.   Seen 03/01/23 euvolemic, amiodarone stopped to see if GI symptoms improved.   Presents today for follow up independently. Maintaining NSR without Amiodarone.  Patient notes despite discontinuation of amiodarone stomach still feels tight most of the time. No constipation, diarrhea, nausea. Reports no chest pain, pressure, tightness. No shortness of breath at rest. HE does note exertional dyspnea which is somewhat improved from previous. No orthopnea, PND.   Inquires  about small oxygen machine which he is using for 10-20 minutes at a time that he got after his next door neighbor's sister passed.  unclear whether he is describing CPAP or oxygen.  Advised against using medical equipment that was not prescribed to him. PCP has increased Amldoipine to 10mg . Has not been checking BP at home.   ROS: Please see the history of present illness.    All other systems reviewed and are negative.   Studies Reviewed: Marland Kitchen   EKG Interpretation Date/Time:  Tuesday May 28 2023 10:19:45 EDT Ventricular Rate:  49 PR Interval:  174 QRS Duration:  110 QT Interval:  468 QTC Calculation: 422 R Axis:   -30  Text Interpretation: Sinus bradycardia Left axis deviation Low voltage QRS Right bundle branch block  Confirmed by Gillian Shields ([id number]) on 05/28/2023 10:31:57 AM    Cardiac Studies & Procedures       ECHOCARDIOGRAM  ECHOCARDIOGRAM COMPLETE 12/24/2022  Narrative ECHOCARDIOGRAM REPORT    Patient Name:   Dean Morris Date of Exam: 12/24/2022 Medical Rec #:  [id number]    Height:       66.0 in Accession #:    [id number]   Weight:       260.4 lb Date of Birth:  22-Sep-1955    BSA:           2.237 m Patient Age:    66 years     BP:           140/80 mmHg Patient Gender: M            HR:           53 bpm. Exam Location:  Outpatient  Procedure: 2D Echo, 3D Echo, Cardiac Doppler, Color Doppler and Strain Analysis  Indications:    Atrial Fibrillation  History:        Patient has prior history of Echocardiogram examinations. CAD, Arrythmias:Atrial Fibrillation; Risk Factors:Former Smoker, Dyslipidemia and Hypertension. GERD.  Sonographer:    Jeryl Columbia RDCS Referring Phys: [id number] Elam Ellis S Kadisha Goodine  IMPRESSIONS   1. Left ventricular ejection fraction, by estimation, is >75%. The left ventricle has hyperdynamic function. The left ventricle has no regional wall motion abnormalities. There is mild left ventricular hypertrophy. Left ventricular diastolic parameters were normal. 2. Right ventricular systolic function is normal. The right ventricular size is normal. 3. Left atrial size was mildly dilated. 4. Trivial mitral valve regurgitation. 5. The aortic valve is tricuspid. Aortic valve regurgitation is not visualized. Aortic valve sclerosis/calcification is present, without any evidence of aortic stenosis. 6. Aortic dilatation noted. There is mild dilatation of the ascending aorta, measuring 42 mm. 7. The inferior vena cava is normal in size with greater than 50%  respiratory variability, suggesting right atrial pressure of 3 mmHg.  FINDINGS Left Ventricle: Left ventricular ejection fraction, by estimation, is >75%. The left ventricle has hyperdynamic function. The left ventricle has no regional wall motion abnormalities. The left ventricular internal cavity size was normal in size. There is mild left ventricular hypertrophy. Left ventricular diastolic parameters were normal.  Right Ventricle: The right ventricular size is normal. Right vetricular wall thickness was not assessed. Right ventricular systolic function is normal.  Left Atrium: Left atrial size was mildly  dilated.  Right Atrium: Right atrial size was normal in size.  Pericardium: There is no evidence of pericardial effusion.  Mitral Valve: There is mild thickening of the mitral valve leaflet(s). Trivial mitral valve regurgitation.  Tricuspid Valve: The tricuspid valve is normal in structure. Tricuspid valve regurgitation is trivial.  Aortic Valve: The aortic valve is tricuspid. Aortic valve regurgitation is not visualized. Aortic valve sclerosis/calcification is present, without any evidence of aortic stenosis.  Pulmonic Valve: The pulmonic valve was grossly normal. Pulmonic valve regurgitation is trivial.  Aorta: The aortic root is normal in size and structure and aortic dilatation noted. There is mild dilatation of the ascending aorta, measuring 42 mm.  Venous: The inferior vena cava is normal in size with greater than 50% respiratory variability, suggesting right atrial pressure of 3 mmHg.  IAS/Shunts: No atrial level shunt detected by color flow Doppler.   LEFT VENTRICLE PLAX 2D LVIDd:         4.76 cm      Diastology LVIDs:         2.22 cm      LV e' medial:    6.31 cm/s LV PW:         1.38 cm      LV E/e' medial:  12.8 LV IVS:        1.09 cm      LV e' lateral:   7.72 cm/s LVOT diam:     2.10 cm      LV E/e' lateral: 10.5 LV SV:         73 LV SV Index:   33 LVOT Area:     3.46 cm  3D Volume EF: LV Volumes (MOD)            3D EF:        57 % LV vol d, MOD A2C: 125.0 ml LV EDV:       151 ml LV vol d, MOD A4C: 125.0 ml LV ESV:       65 ml LV vol s, MOD A2C: 45.6 ml  LV SV:        86 ml LV vol s, MOD A4C: 57.1 ml LV SV MOD A2C:     79.4 ml LV SV MOD A4C:     125.0 ml LV SV MOD BP:      80.6 ml  RIGHT VENTRICLE RV Basal diam:  4.05 cm RV Mid diam:    3.61 cm RV S prime:     13.20 cm/s TAPSE (M-mode): 3.0 cm  LEFT ATRIUM              Index        RIGHT ATRIUM           Index LA diam:        4.40 cm  1.97 cm/m   RA Area:     18.60 cm LA Vol (A2C):   52.7 ml  23.56  ml/m  RA Volume:  57.40 ml  25.66 ml/m LA Vol (A4C):   106.0 ml 47.38 ml/m LA Biplane Vol: 82.8 ml  37.01 ml/m AORTIC VALVE LVOT Vmax:   84.40 cm/s LVOT Vmean:  54.500 cm/s LVOT VTI:    0.210 m  AORTA Ao Root diam: 3.70 cm Ao Asc diam:  4.20 cm  MITRAL VALVE               TRICUSPID VALVE MV Area (PHT): 3.48 cm    TR Peak grad:   16.2 mmHg MV Decel Time: 218 msec    TR Vmax:        201.00 cm/s MV E velocity: 80.70 cm/s MV A velocity: 60.80 cm/s  SHUNTS MV E/A ratio:  1.33        Systemic VTI:  0.21 m Systemic Diam: 2.10 cm  Dietrich Pates MD Electronically signed by Dietrich Pates MD Signature Date/Time: 12/25/2022/1:00:48 PM    Final             Risk Assessment/Calculations:    CHA2DS2-VASc Score = 3   This indicates a 3.2% annual risk of stroke. The patient's score is based upon: CHF History: 0 HTN History: 1 Diabetes History: 0 Stroke History: 0 Vascular Disease History: 1 Age Score: 1 Gender Score: 0         Physical Exam:   VS:  BP (!) 138/94 Comment: left arm  Pulse (!) 49   Ht 5\' 6"  (1.676 m)   Wt 264 lb 4.8 oz (119.9 kg)   BMI 42.66 kg/m    Wt Readings from Last 3 Encounters:  05/28/23 264 lb 4.8 oz (119.9 kg)  05/23/23 250 lb 6.4 oz (113.6 kg)  05/22/23 259 lb (117.5 kg)    GEN: Well nourished, well developed in no acute distress NECK: No JVD; No carotid bruits CARDIAC: bradycardic, RRR, no murmurs, rubs, gallops RESPIRATORY:  Clear to auscultation without rales, wheezing or rhonchi  ABDOMEN: Soft, non-tender, non-distended EXTREMITIES:  No edema; No deformity   ASSESSMENT AND PLAN: .    Chronic diastolic HF - Euvolemic and well compensated on exam. GDMT Metoprolol tartrate 100mg  BID, Lasix 40mg  daily. Low sodium diet, fluid restriction <2L, and daily weights encouraged. Educated to contact our office for weight gain of 2 lbs overnight or 5 lbs in one week.   PAF / Hypercoagulable state -maintaining sinus rhythm not on amiodarone.  Stomach  symptoms have not improved since discontinuation of amiodarone, recommend further follow-up with PCP.  Continue Eliquis 5 mg twice daily, metoprolol tartrate 100 mg twice daily. CHA2DS2-VASc Score = 3 [CHF History: 0, HTN History: 1, Diabetes History: 0, Stroke History: 0, Vascular Disease History: 1, Age Score: 1, Gender Score: 0].  Therefore, the patient's annual risk of stroke is 3.2 %.    Update CBC for monitoring on OAC.  CAD s/p LM stent 2013/hyperlipidemia, LDL less than 70- Stable with no anginal symptoms. No indication for ischemic evaluation.  GDMT atorvastatin, metoprolol.  No aspirin due to [facility].  HTN- BP not at goal less than 130/80.  PCP increased amlodipine 10 mg/week. Discussed to monitor BP at home at least 2 hours after medications and sitting for 5-10 minutes. If BP routinely elevated at follow up plan to transition Losartan to Valsartan.        Dispo: follow up in 6 months  Signed, Alver Sorrow, NP

## 2023-05-28 NOTE — Patient Instructions (Addendum)
Medication Instructions:  Continue your current medications.   Remain OFF Amiodarone.   Continue Amlodipine 10mg  daily and Atorvastatin 40mg  daily.   If your blood pressure is still high when you see Dr. Royal Piedra at the end of October we may consider switching your Losartan to a different blood pressure medication.   *If you need a refill on your cardiac medications before your next appointment, please call your pharmacy*  Labs: Your physician recommends that you return for lab work today for CBC for monitoring of your blood counts on Eliquis  Please return for Lab work. You may come to the...   [facility] (3rd floor) [address] [place], [place] [address]  Open: 8am-Noon and 1pm-4:30pm  Please ring the doorbell on the small table when you exit the elevator and the Lab Tech will come get you  [facility] at [facility] [address], [place], [place] [address] Open: 8am-1pm, then 2pm-4:30pm   [facility]- Please see attached locations sheet stapled to your lab work with address and hours.    Testing/Procedures: Your EKG today shows normal sinus rhythm which is a good result!  Follow-Up: At [facility], you and your health needs are our priority.  As part of our continuing mission to provide you with exceptional heart care, we have created designated Provider Care Teams.  These Care Teams include your primary Cardiologist (physician) and Advanced Practice Providers (APPs -  Physician Assistants and Nurse Practitioners) who all work together to provide you with the care you need, when you need it.  We recommend signing up for the patient portal called "MyChart".  Sign up information is provided on this After Visit Summary.  MyChart is used to connect with patients for Virtual Visits (Telemedicine).  Patients are able to view lab/test results, encounter notes, upcoming appointments, etc.  Non-urgent messages can be sent to your  provider as well.   To learn more about what you can do with MyChart, go to [email or web address].    Your next appointment:   6 month(s)  Provider:   Jodelle Red, MD    Other Instructions  Heart Healthy Diet Recommendations: A low-salt diet is recommended. Meats should be grilled, baked, or boiled. Avoid fried foods. Focus on lean protein sources like fish or chicken with vegetables and fruits. The American Heart Association is a Chief Technology Officer!  American Heart Association Diet and Lifeystyle Recommendations   Exercise recommendations: The American Heart Association recommends 150 minutes of moderate intensity exercise weekly. Try 30 minutes of moderate intensity exercise 4-5 times per week. This could include walking, jogging, or swimming.

## 2023-05-31 ENCOUNTER — Encounter (HOSPITAL_BASED_OUTPATIENT_CLINIC_OR_DEPARTMENT_OTHER): Payer: Self-pay | Admitting: Family

## 2023-06-07 ENCOUNTER — Other Ambulatory Visit: Payer: Self-pay | Admitting: Student

## 2023-07-01 ENCOUNTER — Ambulatory Visit: Payer: Medicare PPO | Admitting: Audiologist

## 2023-07-04 ENCOUNTER — Other Ambulatory Visit: Payer: Self-pay | Admitting: Cardiology

## 2023-07-04 ENCOUNTER — Ambulatory Visit: Payer: Medicare HMO | Admitting: Student

## 2023-07-04 DIAGNOSIS — I480 Paroxysmal atrial fibrillation: Secondary | ICD-10-CM

## 2023-07-04 NOTE — Telephone Encounter (Signed)
Eliquis refill

## 2023-07-04 NOTE — Telephone Encounter (Signed)
Prescription refill request for Eliquis received. Indication: Afib  Last office visit: 05/28/23 Dan Humphreys)  Scr: 1.46 (05/23/23)  Age: 67 Weight: 119.9kg  Appropriate dose. Refill sent.

## 2023-09-23 ENCOUNTER — Telehealth: Payer: Self-pay
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
## 2023-09-27 ENCOUNTER — Ambulatory Visit (INDEPENDENT_AMBULATORY_CARE_PROVIDER_SITE_OTHER): Payer: 59 | Admitting: Student

## 2023-09-27 VITALS — BP 118/80 | HR 63 | Ht 66.5 in | Wt 247.0 lb

## 2023-09-27 DIAGNOSIS — R6889 Other general symptoms and signs: Secondary | ICD-10-CM

## 2023-09-27 NOTE — Progress Notes (Unsigned)
  SUBJECTIVE:   CHIEF COMPLAINT / HPI:   He has insurance concerns today. Presents with folders of paperwork from insurance. All paperwork is stamped [employer] per Americans with Disabilities Act (ADA). Prior, he had [employer]. Now he has [employer]. He was told he owes $4000 but cannot clarify any information to me. States he is capable of taking care of himself physically however he is very concerned and confused with financial information. He continues to state he talked to some lady but she would not give him her information, name, or other identifiable measures.   PERTINENT  PMH / PSH: HTN, HLD, CAD s/p stent, paroxysmal A-fib on Eliquis, GERD   OBJECTIVE:  BP 118/80   Pulse 63   Ht 5' 6.5" (1.689 m)   Wt 247 lb (112 kg)   SpO2 97%   BMI 39.27 kg/m  Gen: appears as stated age, NAD CV: RRR, no murmurs auscultated  ASSESSMENT/PLAN:   Assessment & Plan Forgetfulness Concern for financial matters, most of his paperwork appears to be general rights and privacy information from insurance company. Although he does not present physically concerning today, given his lack of family support, recommend workup regarding potential neurological decline. He has limited support via his neighbor. Recommend returning with neighbor in 2 weeks for extended visit. Will perform MOCA then and obtain further details. Additionally, placed referral to care management with hopes there is someone who can help him with financial resources.  Return in about 2 weeks (around 10/11/2023) for Forgetfulness follow-up. [name], DO 09/29/2023, 11:50 AM PGY-3, [facility]

## 2023-09-27 NOTE — Patient Instructions (Addendum)
It was great to see you today! Thank you for choosing [facility] for your primary care.  Today we addressed: I would like you to return for an extended visit with me and please bring your neighbor.  We will be obtaining a neurological assessment.  I have placed a referral for community care management and I am hoping they will have someone that can assist you.  If you haven't already, sign up for My Chart to have easy access to your labs results, and communication with your primary care physician.  Return in about 2 weeks (around 10/11/2023) for Forgetfulness follow-up. Please arrive 15 minutes before your appointment to ensure smooth check in process.  We appreciate your efforts in making this happen.  Thank you for allowing me to participate in your care, [name], DO 09/27/2023, 3:06 PM PGY-3, [facility]

## 2023-09-30 ENCOUNTER — Telehealth: Payer: Self-pay | Admitting: *Deleted

## 2023-09-30 NOTE — Progress Notes (Addendum)
Complex Care Management Note  Care Guide Note 09/30/2023 Name: Dean Morris MRN: [id number] DOB: Nov 22, 1955  Dean Morris is a 68 y.o. year old male who sees [name], DO for primary care. I reached out to [name] by phone today to offer complex care management services.  Mr. Tureaud was given information about Complex Care Management services today including:   The Complex Care Management services include support from the care team which includes your Nurse Coordinator, Clinical Social Worker, or Pharmacist.  The Complex Care Management team is here to help remove barriers to the health concerns and goals most important to you. Complex Care Management services are voluntary, and the patient may decline or stop services at any time by request to their care team member.   Complex Care Management Consent Status: Patient agreed to services and verbal consent obtained.   Follow up plan:  Telephone appointment with complex care management team member scheduled for:  1/22  Encounter Outcome:  Patient Scheduled   Patient given Non Emergent [facility] Police Department phone number [phone] option 3 for for financial Claims Department.    Gwenevere Ghazi  [facility]  Value-Based Care Institute, [facility] Guide  Direct Dial: [phone]  Fax [phone]

## 2023-10-02 ENCOUNTER — Ambulatory Visit: Payer: 59 | Admitting: Licensed Clinical Social Worker
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
## 2023-10-09 ENCOUNTER — Telehealth: Payer: Self-pay | Admitting: *Deleted

## 2023-10-09 DIAGNOSIS — E782 Mixed hyperlipidemia: Secondary | ICD-10-CM

## 2023-10-09 DIAGNOSIS — I5032 Chronic diastolic (congestive) heart failure: Secondary | ICD-10-CM

## 2023-10-09 DIAGNOSIS — I480 Paroxysmal atrial fibrillation: Secondary | ICD-10-CM

## 2023-10-09 DIAGNOSIS — I10 Essential (primary) hypertension: Secondary | ICD-10-CM

## 2023-10-09 NOTE — Progress Notes (Signed)
Complex Care Management Care Guide Note  10/09/2023 Name: Dean Morris MRN: [id number] DOB: April 21, 1956  Dean Morris is a 68 y.o. year old male who is a primary care patient of [name], DO and is actively engaged with the care management team. I reached out to [name] by phone today to assist with scheduling  with the RN Case Manager.  Follow up plan: Telephone appointment with complex care management team member scheduled for:  10/14/23  Gwenevere Ghazi  [facility]  [facility], [facility] Guide  Direct Dial: [phone]  Fax [phone]

## 2023-10-11 ENCOUNTER — Ambulatory Visit: Payer: 59 | Admitting: Student

## 2023-10-14 ENCOUNTER — Telehealth: Payer: Self-pay | Admitting: *Deleted

## 2023-10-14 ENCOUNTER — Other Ambulatory Visit: Payer: Self-pay | Admitting: *Deleted

## 2023-10-14 NOTE — Patient Outreach (Signed)
  Care Management   Follow Up Note   10/14/2023 Name: Dean Morris MRN: [id number] DOB: 01-Oct-1955   Referred by: [name], DO Reason for referral : Care Management (RNCM: ATTEMPT #1 Initial Outreach For Chronic Disease Management & Care Coordination Needs)   An unsuccessful telephone outreach was attempted today. The patient was referred to the case management team for assistance with care management and care coordination.   Follow Up Plan: The care management team will reach out to the patient again over the next 30 days.   Larey Brick, BSN RN [facility], [facility] Health RN Care Manager Direct Dial: [phone]  Fax: [phone]

## 2023-10-16 ENCOUNTER — Ambulatory Visit: Payer: Self-pay | Admitting: Licensed Clinical Social Worker

## 2023-10-16 NOTE — Patient Outreach (Signed)
  Care Coordination   10/16/2023 Name: Dean Morris MRN: [id number] DOB: [birth year]   Care Coordination Outreach Attempts:  An unsuccessful telephone outreach was attempted today to offer the patient information about available complex care management services.  LCSW A. Clement called Mr .Hayduk twice unable to leave voicemail.   Follow Up Plan:  Additional outreach attempts will be made to offer the patient complex care management information and services.   Encounter Outcome:  No Answer   Care Coordination Interventions:  No, not indicated    Alfonso Clement MSW, LCSW Licensed Clinical Social Worker  [facility], Population Health Direct Dial: [phone]  Fax: [phone]

## 2023-10-18 ENCOUNTER — Telehealth: Payer: Self-pay | Admitting: *Deleted

## 2023-10-18 NOTE — Progress Notes (Signed)
 Complex Care Management Care Guide Note  10/18/2023 Name: Renner Sebald MRN: [id number] DOB: 01-23-1956  Quintel Mccalla is a 68 y.o. year old male who is a primary care patient of Orlando Pond, DO and is actively engaged with the care management team. I reached out to Fairy Senters by phone today to assist with re-scheduling  with the RN Case Manager Licensed Clinical Social Worker.  Follow up plan: Unsuccessful telephone outreach attempt made.  Harlene Satterfield  [facility]  Value-Based Care Institute, [facility] Guide  Direct Dial: [phone]  Fax [phone]

## 2023-10-21 ENCOUNTER — Other Ambulatory Visit: Payer: Self-pay | Admitting: Student

## 2023-10-21 DIAGNOSIS — I10 Essential (primary) hypertension: Secondary | ICD-10-CM

## 2023-10-21 NOTE — Telephone Encounter (Signed)
 Medication was changed to amlodipine  10mg  at prior visit.

## 2023-10-25 NOTE — Progress Notes (Signed)
Complex Care Management Care Guide Note  10/25/2023 Name: Dean Morris MRN: [id number] DOB: 05/31/1956  Dean Morris is a 68 y.o. year old male who is a primary care patient of [name], DO and is actively engaged with the care management team. I reached out to [name] by phone today to assist with re-scheduling  with the RN Case Manager Licensed Clinical Social Worker.  Follow up plan: Unsuccessful telephone outreach attempt made. No further outreach attempts will be made at this time. We have been unable to contact the patient.  Gwenevere Ghazi  [facility] Health  Value-Based Care Institute, [facility] Guide  Direct Dial: [phone]  Fax [phone]

## 2023-11-05 ENCOUNTER — Other Ambulatory Visit: Payer: Self-pay | Admitting: *Deleted

## 2023-11-05 ENCOUNTER — Encounter: Payer: Self-pay | Admitting: *Deleted

## 2023-11-05 ENCOUNTER — Other Ambulatory Visit: Payer: 59 | Admitting: *Deleted

## 2023-11-05 NOTE — Patient Outreach (Signed)
 Care Management   Visit Note  11/05/2023 Name: Dean Morris MRN: [id number] DOB: Mar 26, 1956  Subjective: Dean Morris is a 68 y.o. year old male who is a primary care patient of [name], DO. The Care Management team was consulted for assistance.      Engaged with patient spoke with patient by telephone.    Goals Addressed             This Visit's Progress    RNCM Care Management Expected Outcomes: Monitor, Self-Manage and Reduce Symptoms of: HTN, HLD, CHF, Afib       Current Barriers:  Chronic Disease Management support and education needs related to Atrial Fibrillation, CHF, HTN, and HLD   RNCM Clinical Goal(s):  Patient will verbalize basic understanding of  Atrial Fibrillation, CHF, HTN, and HLD disease process and self health management plan as evidenced by verbal explanation, recognizing symptoms, lifestyle modifications attend all scheduled medical appointments: with primary care provider and specialist as evidenced by keeping all scheduled appointments demonstrate Improved and Ongoing adherence to prescribed treatment plan for Atrial Fibrillation, CHF, HTN, and HLD as evidenced by consistent medication compliance, symptom monitoring and continued lifestyle changes continue to work with RN Care Manager to address care management and care coordination needs related to  Atrial Fibrillation, CHF, HTN, and HLD as evidenced by adherence to CM Team Scheduled appointments through collaboration with RN Care manager, provider, and care team.    Patient reports that he has been having some lower back pain as well as trouble starting his urine. He reports that he thinks he is having an issue with his prostate. He states when his stream does start, it is pulsating and when he thinks he is finished he starts to dribble. He is wanting a referral to Urology to be assessed and requesting RNCM to send a message to the provider.  Interventions: Evaluation of current treatment plan related  to  self management and patient's adherence to plan as established by provider   AFIB Interventions: (Status:  Goal on track:  Yes.) Long Term Goal Counseled on increased risk of stroke due to Afib and benefits of anticoagulation for stroke prevention Reviewed importance of adherence to anticoagulant exactly as prescribed Advised patient to discuss new changes or symptoms with provider Counseled on bleeding risk associated with Eliquis and importance of self-monitoring for signs/symptoms of bleeding. Denies any signs or symptoms of bleeding Counseled on avoidance of NSAIDs due to increased bleeding risk with anticoagulants Counseled on importance of regular laboratory monitoring as prescribed Counseled on seeking medical attention after a head injury or if there is blood in the urine/stool Afib action plan reviewed Screening for signs and symptoms of depression related to chronic disease state  Assessed social determinant of health barriers   Heart Failure Interventions:  (Status:  Goal on track:  Yes.) Long Term Goal Basic overview and discussion of pathophysiology of Heart Failure reviewed. Does not weigh daily nor does he take his Lasix as prescribed. Denies any swelling or shortness of breath at this time Provided education on low sodium diet. Reports not following a specific diet. Reviewed Heart Failure Action Plan in depth and provided written copy Assessed need for readable accurate scales in home Provided education about placing scale on hard, flat surface Advised patient to weigh each morning after emptying bladder Discussed importance of daily weight and advised patient to weigh and record daily. Patient stated he will start weighing daily. RNCM reviewed with patient when to call provider: gaining more than  2 lbs in 24 hr or 5 lbs in a week. Patient verbalized full understanding. Reviewed role of diuretics in prevention of fluid overload and management of heart failure; Discussed  the importance of keeping all appointments with provider Provided patient with education about the role of exercise in the management of heart failure Advised patient to discuss new symptoms or increase in weigh with provider Screening for signs and symptoms of depression related to chronic disease state  Assessed social determinant of health barriers   Hyperlipidemia Interventions:  (Status:  Goal on track:  Yes.) Long Term Goal Medication review performed; medication list updated in electronic medical record.  Provider established cholesterol goals reviewed Counseled on importance of regular laboratory monitoring as prescribed Provided HLD educational materials Reviewed role and benefits of statin for ASCVD risk reduction. Reports compliance with his Lipitor. Discussed strategies to manage statin-induced myalgias. Denies myalgias Reviewed importance of limiting foods high in cholesterol. Does not follow any specific diet but states he does not eat a lot of fried or high cholesterol foods Reviewed exercise goals and target of 150 minutes per week. Reports walking daily for about 30 minutes Screening for signs and symptoms of depression related to chronic disease state Assessed social determinant of health barriers  Lab Results  Component Value Date   CHOL 132 08/31/2021   HDL 27 (L) 08/31/2021   LDLCALC 79 08/31/2021   TRIG 149 08/31/2021   CHOLHDL 4.9 08/31/2021     Hypertension Interventions:  (Status:  Goal on track:  Yes.) Long Term Goal Last practice recorded BP readings:  BP Readings from Last 3 Encounters:  09/27/23 118/80  05/28/23 (!) 138/94  05/23/23 (!) 148/89   Most recent eGFR/CrCl:  Lab Results  Component Value Date   EGFR 52 (L) 05/23/2023    No components found for: "CRCL"  Evaluation of current treatment plan related to hypertension self management and patient's adherence to plan as established by provider. Does not check BP daily and reports that he has had a  mild headache for several days. Denies any swelling, chest pain or dizziness at this time. RNCM discussed in detail with patient about checking blood pressure regularly and keeping a log. Patient verbalized full understanding. Provided education to patient re: stroke prevention, s/s of heart attack and stroke. Education and support provided. Reviewed medications with patient and discussed importance of compliance. Reports compliance with all medications Counseled on adverse effects of illicit drug and excessive alcohol use in patients with high blood pressure. Denies illicit drug or excessive alcohol use Counseled on the importance of exercise goals with target of 150 minutes per week. Reports walking 7 days a week for 30 minutes Discussed plans with patient for ongoing care management follow up and provided patient with direct contact information for care management team Advised patient, providing education and rationale, to monitor blood pressure daily and record, calling PCP for findings outside established parameters. RNCM reviewed with patient goal SBP<140 DBP<90 Reviewed scheduled/upcoming provider appointments including:  Advised patient to discuss new changes or continued elevated home BP readings with symptoms with provider Discussed complications of poorly controlled blood pressure such as heart disease, stroke, circulatory complications, vision complications, kidney impairment, sexual dysfunction Screening for signs and symptoms of depression related to chronic disease state  Assessed social determinant of health barriers  Patient Goals/Self-Care Activities: Take all medications as prescribed Attend all scheduled provider appointments Call pharmacy for medication refills 3-7 days in advance of running out of medications Attend church or other  social activities Perform all self care activities independently  Perform IADL's (shopping, preparing meals, housekeeping, managing finances)  independently Call provider office for new concerns or questions  Work with the social worker to address care coordination needs and will continue to work with the clinical team to address health care and disease management related needs call office if I gain more than 2 pounds in one day or 5 pounds in one week watch for swelling in feet, ankles and legs every day weigh myself daily eat more whole grains, fruits and vegetables, lean meats and healthy fats make a plan to eat healthy take medicine as prescribed check blood pressure daily write blood pressure results in a log or diary call doctor for signs and symptoms of high blood pressure take all medications exactly as prescribed  Follow Up Plan:  Telephone follow up appointment with care management team member scheduled for:  11-19-2023 at 3:45 pm           Consent to Services:  Patient was given information about care management services, agreed to services, and gave verbal consent to participate.   Plan: Telephone follow up appointment with care management team member scheduled for:11-19-2023 at 3:45 pm  Larey Brick, BSN RN [facility], [facility] RN Care Manager Direct Dial: [phone]  Fax: [phone]

## 2023-11-05 NOTE — Patient Instructions (Signed)
 Care Management   Initial Visit Note  11/05/2023 Name: Dean Morris MRN: [id number] DOB: 1955-11-06  Dean Morris is enrolled in a Managed Medicaid plan: No. Outreach attempt today was successful.   Subjective:   Objective:  Assessment: [name] is a 68 y.o. year old male who sees [name], DO for primary care. The care management team was consulted for assistance with care management and care coordination needs related to Disease Management and Care Coordination.   Review of patient status, including review of consultants reports, relevant laboratory and other test results, and collaboration with appropriate care team members and the patient's provider was performed as part of comprehensive patient evaluation and provision of care management services.    SDOH (Social Determinants of Health) screening performed today. See Care Plan Entry related to challenges with: Social Connections   Goals Addressed             This Visit's Progress    RNCM Care Management Expected Outcomes: Monitor, Self-Manage and Reduce Symptoms of: HTN, HLD, CHF, Afib       Current Barriers:  Chronic Disease Management support and education needs related to Atrial Fibrillation, CHF, HTN, and HLD   RNCM Clinical Goal(s):  Patient will verbalize basic understanding of  Atrial Fibrillation, CHF, HTN, and HLD disease process and self health management plan as evidenced by verbal explanation, recognizing symptoms, lifestyle modifications attend all scheduled medical appointments: with primary care provider and specialist as evidenced by keeping all scheduled appointments demonstrate Improved and Ongoing adherence to prescribed treatment plan for Atrial Fibrillation, CHF, HTN, and HLD as evidenced by consistent medication compliance, symptom monitoring and continued lifestyle changes continue to work with RN Care Manager to address care management and care coordination needs related to  Atrial  Fibrillation, CHF, HTN, and HLD as evidenced by adherence to CM Team Scheduled appointments through collaboration with RN Care manager, provider, and care team.    Patient reports that he has been having some lower back pain as well as trouble starting his urine. He reports that he thinks he is having an issue with his prostate. He states when his stream does start, it is pulsating and when he thinks he is finished he starts to dribble. He is wanting a referral to Urology to be assessed and requesting RNCM to send a message to the provider.  Interventions: Evaluation of current treatment plan related to  self management and patient's adherence to plan as established by provider   AFIB Interventions: (Status:  Goal on track:  Yes.) Long Term Goal Counseled on increased risk of stroke due to Afib and benefits of anticoagulation for stroke prevention Reviewed importance of adherence to anticoagulant exactly as prescribed Advised patient to discuss new changes or symptoms with provider Counseled on bleeding risk associated with Eliquis and importance of self-monitoring for signs/symptoms of bleeding. Denies any signs or symptoms of bleeding Counseled on avoidance of NSAIDs due to increased bleeding risk with anticoagulants Counseled on importance of regular laboratory monitoring as prescribed Counseled on seeking medical attention after a head injury or if there is blood in the urine/stool Afib action plan reviewed Screening for signs and symptoms of depression related to chronic disease state  Assessed social determinant of health barriers   Heart Failure Interventions:  (Status:  Goal on track:  Yes.) Long Term Goal Basic overview and discussion of pathophysiology of Heart Failure reviewed. Does not weigh daily nor does he take his Lasix as prescribed. Denies any swelling or shortness of  breath at this time Provided education on low sodium diet. Reports not following a specific diet. Reviewed  Heart Failure Action Plan in depth and provided written copy Assessed need for readable accurate scales in home Provided education about placing scale on hard, flat surface Advised patient to weigh each morning after emptying bladder Discussed importance of daily weight and advised patient to weigh and record daily. Patient stated he will start weighing daily. RNCM reviewed with patient when to call provider: gaining more than 2 lbs in 24 hr or 5 lbs in a week. Patient verbalized full understanding. Reviewed role of diuretics in prevention of fluid overload and management of heart failure; Discussed the importance of keeping all appointments with provider Provided patient with education about the role of exercise in the management of heart failure Advised patient to discuss new symptoms or increase in weigh with provider Screening for signs and symptoms of depression related to chronic disease state  Assessed social determinant of health barriers   Hyperlipidemia Interventions:  (Status:  Goal on track:  Yes.) Long Term Goal Medication review performed; medication list updated in electronic medical record.  Provider established cholesterol goals reviewed Counseled on importance of regular laboratory monitoring as prescribed Provided HLD educational materials Reviewed role and benefits of statin for ASCVD risk reduction. Reports compliance with his Lipitor. Discussed strategies to manage statin-induced myalgias. Denies myalgias Reviewed importance of limiting foods high in cholesterol. Does not follow any specific diet but states he does not eat a lot of fried or high cholesterol foods Reviewed exercise goals and target of 150 minutes per week. Reports walking daily for about 30 minutes Screening for signs and symptoms of depression related to chronic disease state Assessed social determinant of health barriers  Lab Results  Component Value Date   CHOL 132 08/31/2021   HDL 27 (L) 08/31/2021    LDLCALC 79 08/31/2021   TRIG 149 08/31/2021   CHOLHDL 4.9 08/31/2021     Hypertension Interventions:  (Status:  Goal on track:  Yes.) Long Term Goal Last practice recorded BP readings:  BP Readings from Last 3 Encounters:  09/27/23 118/80  05/28/23 (!) 138/94  05/23/23 (!) 148/89   Most recent eGFR/CrCl:  Lab Results  Component Value Date   EGFR 52 (L) 05/23/2023    No components found for: "CRCL"  Evaluation of current treatment plan related to hypertension self management and patient's adherence to plan as established by provider. Does not check BP daily and reports that he has had a mild headache for several days. Denies any swelling, chest pain or dizziness at this time. RNCM discussed in detail with patient about checking blood pressure regularly and keeping a log. Patient verbalized full understanding. Provided education to patient re: stroke prevention, s/s of heart attack and stroke. Education and support provided. Reviewed medications with patient and discussed importance of compliance. Reports compliance with all medications Counseled on adverse effects of illicit drug and excessive alcohol use in patients with high blood pressure. Denies illicit drug or excessive alcohol use Counseled on the importance of exercise goals with target of 150 minutes per week. Reports walking 7 days a week for 30 minutes Discussed plans with patient for ongoing care management follow up and provided patient with direct contact information for care management team Advised patient, providing education and rationale, to monitor blood pressure daily and record, calling PCP for findings outside established parameters. RNCM reviewed with patient goal SBP<140 DBP<90 Reviewed scheduled/upcoming provider appointments including:  Advised patient to  discuss new changes or continued elevated home BP readings with symptoms with provider Discussed complications of poorly controlled blood pressure such as heart  disease, stroke, circulatory complications, vision complications, kidney impairment, sexual dysfunction Screening for signs and symptoms of depression related to chronic disease state  Assessed social determinant of health barriers  Patient Goals/Self-Care Activities: Take all medications as prescribed Attend all scheduled provider appointments Call pharmacy for medication refills 3-7 days in advance of running out of medications Attend church or other social activities Perform all self care activities independently  Perform IADL's (shopping, preparing meals, housekeeping, managing finances) independently Call provider office for new concerns or questions  Work with the social worker to address care coordination needs and will continue to work with the clinical team to address health care and disease management related needs call office if I gain more than 2 pounds in one day or 5 pounds in one week watch for swelling in feet, ankles and legs every day weigh myself daily eat more whole grains, fruits and vegetables, lean meats and healthy fats make a plan to eat healthy take medicine as prescribed check blood pressure daily write blood pressure results in a log or diary call doctor for signs and symptoms of high blood pressure take all medications exactly as prescribed  Follow Up Plan:  Telephone follow up appointment with care management team member scheduled for:  11-19-2023 at 3:45 pm           Follow up plan:  Telephone follow up appointment with care management team member scheduled for:11-19-2023 at 3:45 pm  Mr. Mchugh was given information about Care Management services today including:  Care Management services include personalized support from designated clinical staff supervised by a physician, including individualized plan of care and coordination with other care providers 24/7 contact phone numbers for assistance for urgent and routine care needs. The patient may stop CCM  services at any time (effective at the end of the month) by phone call to the office staff.  Patient agreed to services and verbal consent obtained.  Larey Brick, BSN RN [facility], [facility] RN Care Manager Direct Dial: [phone]  Fax: [phone]

## 2023-11-06 ENCOUNTER — Ambulatory Visit: Payer: 59 | Admitting: Licensed Clinical Social Worker

## 2023-11-06 NOTE — Patient Outreach (Signed)
 Care Coordination   Follow Up Visit Note   11/06/2023 Name: Dean Morris MRN: [id number] DOB: 1956/03/31  Dean Morris is a 68 y.o. year old male who sees Dean Mattocks, DO for primary care. I spoke with  Dean Morris by phone today.  What matters to the patients health and wellness today?  Follow up visit completed with Dean Morris today. Dean Morris reports he is doing well and concerns regarding fraudulent activity is handled.     Goals Addressed             This Visit's Progress    COMPLETED: Care Coordination       Contact financial institution to advise of suspected fraudulent activity.  Contact family justice center for assistance with elder financial concerns.  Speak with corporation of guardianship for educational classes to prevent elder financial abuse.         SDOH assessments and interventions completed:  No   Completed 11/05/2023  Care Coordination Interventions:  No, not indicated   Follow up plan: No further intervention required.   Encounter Outcome:  Patient Visit Completed   Gwyndolyn Saxon MSW, LCSW Licensed Clinical Social Worker  [facility], Population Health Direct Dial: [phone]  Fax: [phone]

## 2023-11-06 NOTE — Patient Instructions (Signed)
 Visit Information  Thank you for taking time to visit with me today. Please don't hesitate to contact me if I can be of assistance to you.   Following are the goals we discussed today:   Goals Addressed             This Visit's Progress    COMPLETED: Care Coordination       Contact financial institution to advise of suspected fraudulent activity.  Contact family justice center for assistance with elder financial concerns.  Speak with corporation of guardianship for educational classes to prevent elder financial abuse.          Please call the care guide team at [phone] if you need to cancel or reschedule your appointment.   If you are experiencing a Mental Health or Behavioral Health Crisis or need someone to talk to, please call 911   Patient verbalizes understanding of instructions and care plan provided today and agrees to view in MyChart. Active MyChart status and patient understanding of how to access instructions and care plan via MyChart confirmed with patient.     The patient has been provided with contact information for the care management team and has been advised to call with any health related questions or concerns.   Gwyndolyn Saxon MSW, LCSW Licensed Clinical Social Worker  [facility], Population Health Direct Dial: [phone]  Fax: [phone]

## 2023-11-07 ENCOUNTER — Ambulatory Visit: Payer: Self-pay | Admitting: Licensed Clinical Social Worker

## 2023-11-07 NOTE — Patient Outreach (Signed)
 Care Coordination   Follow Up Visit Note   11/07/2023 Name: Dean Morris MRN: [id number] DOB: September 05, 1956  Dean Morris is a 68 y.o. year old male who sees [name], DO for primary care. I spoke with  Dean Morris by phone today.  What matters to the patients health and wellness today?  Mr. Dean Morris had specific questions regarding insurance coverage. LCSW A. Felton Clinton encouraged Mr. Dean Morris to call Dean Morris directly regarding specific coverage and benefits questions.     Goals Addressed   None     SDOH assessments and interventions completed:  No   Completed 11/05/2023  Care Coordination Interventions:  Yes, provided  Interventions Today    Flowsheet Row Most Recent Value  Education Interventions   Education Provided Provided Education  [Mr. Myer's was directed to contact insurance comp directly regarding insurance coverage questions.]       Follow up plan: No further intervention required.   Encounter Outcome:  Patient Visit Completed   Gwyndolyn Saxon MSW, LCSW Licensed Clinical Social Worker  [facility], Population Health Direct Dial: [phone]  Fax: [phone]

## 2023-11-07 NOTE — Patient Instructions (Signed)
 Visit Information  Thank you for taking time to visit with me today. Please don't hesitate to contact me if I can be of assistance to you.   Following are the goals we discussed today:   Goals Addressed   None        Please call the care guide team at [phone] if you need to cancel or reschedule your appointment.   If you are experiencing a Mental Health or Behavioral Health Crisis or need someone to talk to, please call 911   Patient verbalizes understanding of instructions and care plan provided today and agrees to view in MyChart. Active MyChart status and patient understanding of how to access instructions and care plan via MyChart confirmed with patient.     The patient has been provided with contact information for the care management team and has been advised to call with any health related questions or concerns.   Gwyndolyn Saxon MSW, LCSW Licensed Clinical Social Worker  [facility], Population Health Direct Dial: [phone]  Fax: [phone]

## 2023-11-14 ENCOUNTER — Other Ambulatory Visit: Payer: Self-pay | Admitting: Student

## 2023-11-14 DIAGNOSIS — E782 Mixed hyperlipidemia: Secondary | ICD-10-CM

## 2023-11-15 ENCOUNTER — Other Ambulatory Visit: Payer: Self-pay | Admitting: Student

## 2023-11-18 ENCOUNTER — Other Ambulatory Visit: Payer: Self-pay

## 2023-11-18 DIAGNOSIS — I480 Paroxysmal atrial fibrillation: Secondary | ICD-10-CM

## 2023-11-18 MED ORDER — METOPROLOL TARTRATE 100 MG PO TABS: 100.0000 mg | ORAL_TABLET | Freq: Two times a day (BID) | ORAL | 1 refills | Status: DC

## 2023-11-18 NOTE — Telephone Encounter (Signed)
 Called and spoke to patient.  Patient seems a bit confused.  He states that he was told not to come back to [facility] because we are not in "nations or district". I am assuming that he meant network. He states that he has to find a doctor elsewhere yet he continued to say that he wants to come back to [facility] as Dr. Royal Piedra knows his history.  Patient states that he did not request a referral for a urologist and that he does not need one.  Patient does not want to make an appointment as he was told not to come back to [facility] or he would have to "pay out of pocket."  .Glennie Hawk, CMA

## 2023-11-19 ENCOUNTER — Other Ambulatory Visit: Payer: Self-pay | Admitting: *Deleted

## 2023-11-19 NOTE — Patient Instructions (Signed)
 Visit Information  Thank you for taking time to visit with me today. Please don't hesitate to contact me if I can be of assistance to you before our next scheduled telephone appointment.  Following are the goals we discussed today:   Goals Addressed             This Visit's Progress    RNCM Care Management Expected Outcomes: Monitor, Self-Manage and Reduce Symptoms of: HTN, HLD, CHF, Afib       Current Barriers:  Chronic Disease Management support and education needs related to Atrial Fibrillation, CHF, HTN, and HLD   RNCM Clinical Goal(s):  Patient will verbalize basic understanding of  Atrial Fibrillation, CHF, HTN, and HLD disease process and self health management plan as evidenced by verbal explanation, recognizing symptoms, lifestyle modifications attend all scheduled medical appointments: with primary care provider and specialist as evidenced by keeping all scheduled appointments demonstrate Improved and Ongoing adherence to prescribed treatment plan for Atrial Fibrillation, CHF, HTN, and HLD as evidenced by consistent medication compliance, symptom monitoring and continued lifestyle changes continue to work with RN Care Manager to address care management and care coordination needs related to  Atrial Fibrillation, CHF, HTN, and HLD as evidenced by adherence to CM Team Scheduled appointments through collaboration with RN Care manager, provider, and care team.     Interventions: Evaluation of current treatment plan related to  self management and patient's adherence to plan as established by provider   AFIB Interventions: (Status:  Goal on track:  Yes.) Long Term Goal Counseled on increased risk of stroke due to Afib and benefits of anticoagulation for stroke prevention Reviewed importance of adherence to anticoagulant exactly as prescribed Advised patient to discuss new changes or symptoms with provider Counseled on bleeding risk associated with Eliquis and importance of  self-monitoring for signs/symptoms of bleeding. Denies any signs or symptoms of bleeding Counseled on avoidance of NSAIDs due to increased bleeding risk with anticoagulants Counseled on importance of regular laboratory monitoring as prescribed Counseled on seeking medical attention after a head injury or if there is blood in the urine/stool Afib action plan reviewed Screening for signs and symptoms of depression related to chronic disease state  Assessed social determinant of health barriers   Heart Failure Interventions:  (Status:  Goal on track:  Yes.) Long Term Goal Basic overview and discussion of pathophysiology of Heart Failure reviewed. Patient reports that for 5 days he was following the recommended treatment and he was weighing daily. He does state that he has fallen off but he will start back doing. Provided education on low sodium diet. Reports not following a specific diet. Reviewed Heart Failure Action Plan in depth and provided written copy Assessed need for readable accurate scales in home Provided education about placing scale on hard, flat surface Advised patient to weigh each morning after emptying bladder Discussed importance of daily weight and advised patient to weigh and record daily.  RNCM reviewed with patient when to call provider: gaining more than 2 lbs in 24 hr or 5 lbs in a week. Patient verbalized full understanding. Reports daily weight of last 5 days 243, 242, 243, 240, 241 Reviewed role of diuretics in prevention of fluid overload and management of heart failure; Discussed the importance of keeping all appointments with provider Provided patient with education about the role of exercise in the management of heart failure Advised patient to discuss new symptoms or increase in weigh with provider Screening for signs and symptoms of depression related to  chronic disease state  Assessed social determinant of health barriers   Hyperlipidemia Interventions:   (Status:  Goal on track:  Yes.) Long Term Goal Medication review performed; medication list updated in electronic medical record.  Provider established cholesterol goals reviewed Counseled on importance of regular laboratory monitoring as prescribed Provided HLD educational materials Reviewed role and benefits of statin for ASCVD risk reduction. Reports compliance with his Lipitor. Discussed strategies to manage statin-induced myalgias. Denies myalgias Reviewed importance of limiting foods high in cholesterol. Does not follow any specific diet but states he does not eat a lot of fried or high cholesterol foods Reviewed exercise goals and target of 150 minutes per week. Reports walking daily for about 30 minutes Screening for signs and symptoms of depression related to chronic disease state Assessed social determinant of health barriers  Lab Results  Component Value Date   CHOL 132 08/31/2021   HDL 27 (L) 08/31/2021   LDLCALC 79 08/31/2021   TRIG 149 08/31/2021   CHOLHDL 4.9 08/31/2021     Hypertension Interventions:  (Status:  Goal on track:  Yes.) Long Term Goal Last practice recorded BP readings:  BP Readings from Last 3 Encounters:  09/27/23 118/80  05/28/23 (!) 138/94  05/23/23 (!) 148/89   Most recent eGFR/CrCl:  Lab Results  Component Value Date   EGFR 52 (L) 05/23/2023    No components found for: "CRCL"  Evaluation of current treatment plan related to hypertension self management and patient's adherence to plan as established by provider. Checks blood pressure regularly. Last 5 readings: 118/60, 120/82, 124/80, 128/82, 133/77 Provided education to patient re: stroke prevention, s/s of heart attack and stroke. Education and support provided. Reviewed medications with patient and discussed importance of compliance. Reports compliance with all medications Counseled on adverse effects of illicit drug and excessive alcohol use in patients with high blood pressure. Denies illicit  drug or excessive alcohol use Counseled on the importance of exercise goals with target of 150 minutes per week. Reports walking 7 days a week for 30 minutes Discussed plans with patient for ongoing care management follow up and provided patient with direct contact information for care management team Advised patient, providing education and rationale, to monitor blood pressure daily and record, calling PCP for findings outside established parameters. RNCM reviewed with patient goal SBP<140 DBP<90 Reviewed scheduled/upcoming provider appointments including:  Advised patient to discuss new changes or continued elevated home BP readings with symptoms with provider Discussed complications of poorly controlled blood pressure such as heart disease, stroke, circulatory complications, vision complications, kidney impairment, sexual dysfunction Screening for signs and symptoms of depression related to chronic disease state  Assessed social determinant of health barriers  Patient Goals/Self-Care Activities: Take all medications as prescribed Attend all scheduled provider appointments Call pharmacy for medication refills 3-7 days in advance of running out of medications Attend church or other social activities Perform all self care activities independently  Perform IADL's (shopping, preparing meals, housekeeping, managing finances) independently Call provider office for new concerns or questions  Work with the social worker to address care coordination needs and will continue to work with the clinical team to address health care and disease management related needs call office if I gain more than 2 pounds in one day or 5 pounds in one week watch for swelling in feet, ankles and legs every day weigh myself daily eat more whole grains, fruits and vegetables, lean meats and healthy fats make a plan to eat healthy take medicine as prescribed  check blood pressure daily write blood pressure results in a  log or diary call doctor for signs and symptoms of high blood pressure take all medications exactly as prescribed  Follow Up Plan:  Telephone follow up appointment with care management team member scheduled for:  12-19-2023 at 11:00 am           Our next appointment is by telephone on 12-19-2023 at 11:00 am  Please call the care guide team at [phone] if you need to cancel or reschedule your appointment.   If you are experiencing a Mental Health or Behavioral Health Crisis or need someone to talk to, please call the Suicide and Crisis Lifeline: 988 call the [place] National Suicide Prevention Lifeline: 808-800-3594 or TTY: 616-391-8882 TTY 303 868 7692) to talk to a trained counselor call 1-800-273-TALK (toll free, 24 hour hotline) go to [facility] [address] [place] [phone])   Patient verbalizes understanding of instructions and care plan provided today and agrees to view in MyChart. Active MyChart status and patient understanding of how to access instructions and care plan via MyChart confirmed with patient.     Telephone follow up appointment with care management team member scheduled for:12-19-2023 at 11:00 am  Larey Brick, BSN RN [facility], [facility] RN Care Manager Direct Dial: [phone]  Fax: [phone]

## 2023-11-19 NOTE — Patient Outreach (Signed)
 Care Management   Visit Note  11/19/2023 Name: Dean Morris MRN: [id number] DOB: 1956/08/19  Subjective: Dean Morris is a 67 y.o. year old male who is a primary care patient of [name], DO. The Care Management team was consulted for assistance.      Engaged with patient spoke with patient by telephone.    Goals Addressed             This Visit's Progress    RNCM Care Management Expected Outcomes: Monitor, Self-Manage and Reduce Symptoms of: HTN, HLD, CHF, Afib       Current Barriers:  Chronic Disease Management support and education needs related to Atrial Fibrillation, CHF, HTN, and HLD   RNCM Clinical Goal(s):  Patient will verbalize basic understanding of  Atrial Fibrillation, CHF, HTN, and HLD disease process and self health management plan as evidenced by verbal explanation, recognizing symptoms, lifestyle modifications attend all scheduled medical appointments: with primary care provider and specialist as evidenced by keeping all scheduled appointments demonstrate Improved and Ongoing adherence to prescribed treatment plan for Atrial Fibrillation, CHF, HTN, and HLD as evidenced by consistent medication compliance, symptom monitoring and continued lifestyle changes continue to work with RN Care Manager to address care management and care coordination needs related to  Atrial Fibrillation, CHF, HTN, and HLD as evidenced by adherence to CM Team Scheduled appointments through collaboration with RN Care manager, provider, and care team.     Interventions: Evaluation of current treatment plan related to  self management and patient's adherence to plan as established by provider   AFIB Interventions: (Status:  Goal on track:  Yes.) Long Term Goal Counseled on increased risk of stroke due to Afib and benefits of anticoagulation for stroke prevention Reviewed importance of adherence to anticoagulant exactly as prescribed Advised patient to discuss new changes or symptoms  with provider Counseled on bleeding risk associated with Eliquis and importance of self-monitoring for signs/symptoms of bleeding. Denies any signs or symptoms of bleeding Counseled on avoidance of NSAIDs due to increased bleeding risk with anticoagulants Counseled on importance of regular laboratory monitoring as prescribed Counseled on seeking medical attention after a head injury or if there is blood in the urine/stool Afib action plan reviewed Screening for signs and symptoms of depression related to chronic disease state  Assessed social determinant of health barriers   Heart Failure Interventions:  (Status:  Goal on track:  Yes.) Long Term Goal Basic overview and discussion of pathophysiology of Heart Failure reviewed. Patient reports that for 5 days he was following the recommended treatment and he was weighing daily. He does state that he has fallen off but he will start back doing. Provided education on low sodium diet. Reports not following a specific diet. Reviewed Heart Failure Action Plan in depth and provided written copy Assessed need for readable accurate scales in home Provided education about placing scale on hard, flat surface Advised patient to weigh each morning after emptying bladder Discussed importance of daily weight and advised patient to weigh and record daily.  RNCM reviewed with patient when to call provider: gaining more than 2 lbs in 24 hr or 5 lbs in a week. Patient verbalized full understanding. Reports daily weight of last 5 days 243, 242, 243, 240, 241 Reviewed role of diuretics in prevention of fluid overload and management of heart failure; Discussed the importance of keeping all appointments with provider Provided patient with education about the role of exercise in the management of heart failure Advised patient to discuss  new symptoms or increase in weigh with provider Screening for signs and symptoms of depression related to chronic disease state   Assessed social determinant of health barriers   Hyperlipidemia Interventions:  (Status:  Goal on track:  Yes.) Long Term Goal Medication review performed; medication list updated in electronic medical record.  Provider established cholesterol goals reviewed Counseled on importance of regular laboratory monitoring as prescribed Provided HLD educational materials Reviewed role and benefits of statin for ASCVD risk reduction. Reports compliance with his Lipitor. Discussed strategies to manage statin-induced myalgias. Denies myalgias Reviewed importance of limiting foods high in cholesterol. Does not follow any specific diet but states he does not eat a lot of fried or high cholesterol foods Reviewed exercise goals and target of 150 minutes per week. Reports walking daily for about 30 minutes Screening for signs and symptoms of depression related to chronic disease state Assessed social determinant of health barriers  Lab Results  Component Value Date   CHOL 132 08/31/2021   HDL 27 (L) 08/31/2021   LDLCALC 79 08/31/2021   TRIG 149 08/31/2021   CHOLHDL 4.9 08/31/2021     Hypertension Interventions:  (Status:  Goal on track:  Yes.) Long Term Goal Last practice recorded BP readings:  BP Readings from Last 3 Encounters:  09/27/23 118/80  05/28/23 (!) 138/94  05/23/23 (!) 148/89   Most recent eGFR/CrCl:  Lab Results  Component Value Date   EGFR 52 (L) 05/23/2023    No components found for: "CRCL"  Evaluation of current treatment plan related to hypertension self management and patient's adherence to plan as established by provider. Checks blood pressure regularly. Last 5 readings: 118/60, 120/82, 124/80, 128/82, 133/77 Provided education to patient re: stroke prevention, s/s of heart attack and stroke. Education and support provided. Reviewed medications with patient and discussed importance of compliance. Reports compliance with all medications Counseled on adverse effects of illicit  drug and excessive alcohol use in patients with high blood pressure. Denies illicit drug or excessive alcohol use Counseled on the importance of exercise goals with target of 150 minutes per week. Reports walking 7 days a week for 30 minutes Discussed plans with patient for ongoing care management follow up and provided patient with direct contact information for care management team Advised patient, providing education and rationale, to monitor blood pressure daily and record, calling PCP for findings outside established parameters. RNCM reviewed with patient goal SBP<140 DBP<90 Reviewed scheduled/upcoming provider appointments including:  Advised patient to discuss new changes or continued elevated home BP readings with symptoms with provider Discussed complications of poorly controlled blood pressure such as heart disease, stroke, circulatory complications, vision complications, kidney impairment, sexual dysfunction Screening for signs and symptoms of depression related to chronic disease state  Assessed social determinant of health barriers  Patient Goals/Self-Care Activities: Take all medications as prescribed Attend all scheduled provider appointments Call pharmacy for medication refills 3-7 days in advance of running out of medications Attend church or other social activities Perform all self care activities independently  Perform IADL's (shopping, preparing meals, housekeeping, managing finances) independently Call provider office for new concerns or questions  Work with the social worker to address care coordination needs and will continue to work with the clinical team to address health care and disease management related needs call office if I gain more than 2 pounds in one day or 5 pounds in one week watch for swelling in feet, ankles and legs every day weigh myself daily eat more whole grains, fruits  and vegetables, lean meats and healthy fats make a plan to eat healthy take  medicine as prescribed check blood pressure daily write blood pressure results in a log or diary call doctor for signs and symptoms of high blood pressure take all medications exactly as prescribed  Follow Up Plan:  Telephone follow up appointment with care management team member scheduled for:  12-19-2023 at 11:00 am           Consent to Services:  Patient was given information about care management services, agreed to services, and gave verbal consent to participate.   Plan: Telephone follow up appointment with care management team member scheduled for:12-19-2023 at 11:00 am  Larey Brick, BSN RN [facility], [facility] RN Care Manager Direct Dial: [phone]  Fax: [phone]

## 2023-11-22 ENCOUNTER — Ambulatory Visit (HOSPITAL_BASED_OUTPATIENT_CLINIC_OR_DEPARTMENT_OTHER): Payer: Medicare HMO | Admitting: Cardiology

## 2023-12-19 ENCOUNTER — Ambulatory Visit: Payer: Self-pay

## 2023-12-19 NOTE — Patient Outreach (Signed)
 Complex Care Management   Visit Note  12/19/2023  Name:  Virlan Kempker MRN: [id number] DOB: [birth year]  Situation: Referral received for Complex Care Management related to Heart Failure I obtained verbal consent from the patient.  Visit completed with the patient  on the phone  Background:   Past Medical History:  Diagnosis Date   Allergy    seasonal allergies   Anxiety    Arthritis    bilateral hands   Asthma    uses inhaler   CAD (coronary artery disease)    stent   Cholelithiasis    Chronic kidney disease    CKD-   Depression    GERD (gastroesophageal reflux disease)    not on meds at this time   Gout    Hyperlipidemia    on meds   Hypertension    not on meds at this time   Myocardial infarction [facility]) 2011   hx of   Paroxysmal A-fib (HCC)     Assessment: Patient Reported Symptoms:  Cognitive Able to follow simple commands, Alert and oriented to person, place, and time  Neurological Not assessed    HEENT Not assessed    Cardiovascular No symptoms reported    Respiratory Not assesed    Endocrine Not assessed    Gastrointestinal Not assessed    Genitourinary Not assessed    Integumentary Not assessed    Musculoskeletal Not assessed    Psychosocial Not assessed     There were no vitals filed for this visit.  Medications Reviewed Today   Medications were not reviewed in this encounter     Recommendation:   PCP Follow-up  Follow Up Plan:   Telephone follow-up in 3 week  Juanell Fairly RN, BSN, [facility]  [facility], [facility]  Care Coordinator Phone: [phone]

## 2023-12-19 NOTE — Patient Instructions (Signed)
 Visit Information  Thank you for taking time to visit with me today. Please don't hesitate to contact me if I can be of assistance to you before our next scheduled appointment.  Your next care management appointment is by telephone on 01/15/24 at 130 pm  Telephone follow-up in 3 weeks  Please call the care guide team at [phone] if you need to cancel, schedule, or reschedule an appointment.   Please call 1-800-273-TALK (toll free, 24 hour hotline) if you are experiencing a Mental Health or Behavioral Health Crisis or need someone to talk to.  Juanell Fairly RN, BSN, [facility]  [facility], [facility]  Care Coordinator Phone: [phone]

## 2023-12-23 ENCOUNTER — Ambulatory Visit (INDEPENDENT_AMBULATORY_CARE_PROVIDER_SITE_OTHER): Payer: Medicare HMO | Admitting: Nurse Practitioner

## 2023-12-23 ENCOUNTER — Encounter (HOSPITAL_BASED_OUTPATIENT_CLINIC_OR_DEPARTMENT_OTHER): Payer: Self-pay | Admitting: Nurse Practitioner

## 2023-12-23 VITALS — BP 111/58 | HR 70 | Ht 66.0 in | Wt 236.0 lb

## 2023-12-23 DIAGNOSIS — E785 Hyperlipidemia, unspecified: Secondary | ICD-10-CM

## 2023-12-23 DIAGNOSIS — D6859 Other primary thrombophilia: Secondary | ICD-10-CM

## 2023-12-23 DIAGNOSIS — I25118 Atherosclerotic heart disease of native coronary artery with other forms of angina pectoris: Secondary | ICD-10-CM | POA: Diagnosis not present

## 2023-12-23 DIAGNOSIS — L853 Xerosis cutis: Secondary | ICD-10-CM

## 2023-12-23 DIAGNOSIS — K5900 Constipation, unspecified: Secondary | ICD-10-CM | POA: Diagnosis not present

## 2023-12-23 DIAGNOSIS — I5032 Chronic diastolic (congestive) heart failure: Secondary | ICD-10-CM | POA: Diagnosis not present

## 2023-12-23 DIAGNOSIS — G4730 Sleep apnea, unspecified: Secondary | ICD-10-CM

## 2023-12-23 DIAGNOSIS — I480 Paroxysmal atrial fibrillation: Secondary | ICD-10-CM

## 2023-12-23 DIAGNOSIS — R0609 Other forms of dyspnea: Secondary | ICD-10-CM

## 2023-12-23 NOTE — Patient Instructions (Addendum)
 Medication Instructions:   Your physician recommends that you continue on your current medications as directed. Please refer to the Current Medication list given to you today.   *If you need a refill on your cardiac medications before your next appointment, please call your pharmacy*  Lab Work:  TODAY!!!! LIPID/CMET  If you have labs (blood work) drawn today and your tests are completely normal, you will receive your results only by: MyChart Message (if you have MyChart) OR A paper copy in the mail If you have any lab test that is abnormal or we need to change your treatment, we will call you to review the results.  Testing/Procedures:  None ordered.  Follow-Up: At [facility], you and your health needs are our priority.  As part of our continuing mission to provide you with exceptional heart care, our providers are all part of one team.  This team includes your primary Cardiologist (physician) and Advanced Practice Providers or APPs (Physician Assistants and Nurse Practitioners) who all work together to provide you with the care you need, when you need it.  Your next appointment:   1 year(s)  Provider:   Jodelle Red, MD, Eligha Bridegroom, NP, or Gillian Shields, NP    We recommend signing up for the patient portal called "MyChart".  Sign up information is provided on this After Visit Summary.  MyChart is used to connect with patients for Virtual Visits (Telemedicine).  Patients are able to view lab/test results, encounter notes, upcoming appointments, etc.  Non-urgent messages can be sent to your provider as well.   To learn more about what you can do with MyChart, go to [email or web address].   Other Instructions  Your physician wants you to follow-up in: 1 year.  You will receive a reminder letter in the mail two months in advance. If you don't receive a letter, please call our office to schedule the follow-up appointment.  Tackling Obesity with Lifestyle  Changes  Obesity- What is it? And What can we do about it?  Obesity is a chronic complex disease defined as excessive fat deposits that can have a negative effect on our health. It can lead to many other diseases including type 2 diabetes.  Weight gain occurs when the amount of energy (calories) we consume is greater than the amount we use.  When our energy output is greater than our energy input we lose weight. The basic concept is simple, but in reality, it's much more complicated.  Unfortunately, in some people, our bodies have many ways it can compensate when we try to eat less and move more which can prevent [place] from changing our weight. This can lead to some people having a much more difficult time losing weight even when they put healthy habits into practice. This can be frustrating. We want to focus on healthy habits, physical activity and how we feel, and less the number on the scale.  Food As Energy  Calories  Calories is just a unit of measurement for energy.  Counting calories is not required to lose weight but counting for a short period of time can:   help you learn good portion sizes   Learn what your true energy needs are.   Help you be more aware of your snacking or grazing habits  To help calculate how many calories you should be eating, the NIH has a great body weight planner calculator at BeverageBuggy.si  Types of Energy Expenditure  Basal Metabolic Rate (BMR) Energy that our bodies use  to preform everyday tasks. More muscle mass through resistance training can increase this a small amount  Thermic Effect of Food The amount of energy that it takes to breakdown the food we eat. This will be highest when we eat protein and fiber rich foods  Exercise Energy Expenditure The amount of energy used during formal exercise (walking, biking, weightlifting)  Non-exercise activity thermogenesis (NEAT) The amount of energy spent on activities that are not formal  exercise (standing, fidgeting). Therefore, it is not only important to do formal exercise but also move around throughout the day.  Managing The Meal  Macro nutrients (carbohydrates, fats and protein, fiber, water)  Micronutrients (vitamins, minerals)  Dietary Fiber  Benefits Examples Cautions  Soluble fiber  Decreases cholesterol  improve blood sugar control,  Feeds our gut bacteria  Allows [place] to feel fuller for longer so we eat less  fruits  oats  barley  legumes  peas  Beans  vegetables (broccoli) and root vegetables (carrots) Add fiber into your diet slowly and be sure to drink at least 8 cups of water a day. This will help limit gas, bloating, diarrhea, or constipation.  Insoluble Fiber  Improves digestive health by making stool easier to pass  Allows [place] to feel fuller for longer so we eat less  whole grains  nuts  seeds  skin of fruit  vegetables (green beans, zucchini, cauliflower)  Tricks to add more fiber to your diet   Add beans (pinto, kidney, lima, navy and garbanzo) to salads, ground meat or brown rice   Add nuts or seeds and or fresh/frozen fruit to yogurt, cottage cheese, salads or steel cut oats   Cut up vegetables and eat with hummus   Look for unsweetened whole grain cereals with at least 5g of fiber per serving   Switch to whole grain bread. Look for bread that has whole grain flour as the first ingredient and has more fiber than carbs if you were to multiple the fiber x 10.   Try bulgar, barely, quinoa, buckwheat, brown rice wild rice instead of white rice   Keep frozen vegetables on hand to add to dishes or soups  Meal Planning:  Meal planning is the key to setting you up for success. Here are some examples of healthy meal options.  Breakfast  Option 1: Omelette with vegetables (1 egg, spinach, mushrooms, or other vegetable of your choice), 2 slices whole-grain toast, tip of thumb size butter or soft margarine,  cup low-fat milk or  yogurt  Option 2: steel-cut rolled oats (? cup dry), 1 tbsp peanut butter added to cooked oats,  cup low-fat milk.  Option 3: 2 slices whole-grain or rye toast with avocado spread ( small avocado mased with herbs and pepper to taste), 1 poached egg or [place] up (cooked to your liking)  Option 4:  cup plain 0% [place] yogurt topped with  cup berries and  cup walnuts or almonds, 2 slices whole-grain or rye toast, tip of thumb size soft margarine/butter  Lunch:  Option 1: 2 cups red lentil soup, green salad with 1 tbsp homemade vinaigrette (extra virgin olive oil and vinegar of choice plus spices)  Option 2: 3 oz. roasted chicken, 2 slices whole-grain bread, 2 tsp mayonnaise, mustard, lettuce, tomato if desired, 1 fruit (example: medium-sized apple or small pear)  Option 3: 3 oz. tuna packed in water, 1 whole-wheat pita (6 inch), 2 tsp mayonnaise, lettuce, tomato, or other non-starchy vegetable of your choice, 1 fruit (example: medium-sized  apple or small pear)  Option 4: 1 serving of garden veggie buddha bowl with lentils and tahini sauce and 1 cup berries topped with  cup plain 0% Greek yogurt  Dinner:  Option 1: 1 serving roasted cauliflower salad, 3-4 oz. grilled or baked pork loin chop, 1/2 cup mashed potato, or brown rice or quinoa  Option 2: 1 serving fish (baked, grilled or air fried), green salad, 1 tbsp homemade vinaigrette,  cup cooked couscous  Option 3: 1 cup cooked whole grained pasta (example: spaghetti, spirals, macaroni),  cup favorite pasta sauce (preferably homemade), 3-4 oz. grilled or baked chicken, green salad, 1 tbsp homemade vinaigrette  Option 4: 1 serving oven roasted salmon,  cup mashed sweet potato or couscous or brown rice or quinoa, broccoli (steamed or roasted)  Healthy snacks:   Carrots or celery with 1 tbsp of hummus   1 medium-sized fruit (apple or orange)   1 cup plain 0% [place] yogurt with  cup berries   Half apple, sliced, with 1  tbsp (15 mL) peanut or almond butter  Dining out:  Eating away from home has become a part of many people's lifestyle. Making healthy choices when you are eating out is important too. Portion size is an important part of healthy choices. Most branded fast-food places provide calories, sodium, and fat content for their menu items. www.calorieking.com would be great resource to find nutrition facts for your favorite brands and fast-food restaurants. Company specific website can be Chief Technology Officer for nutrition information for their items. (e.g. www.mcdonalds.com or www.nutritionix.com/[place]/menu/premium)  Here are some tips to help you make wise food choices when you are dining out.  Chose more often Avoid  Beverages   Choose more often: Water, low fat milk  Sugar-free/diet drinks  Unsweet tea or coffee    Avoid: Milkshakes, fruit drinks, regular pop  Alcohol, specialty drinks (e.g. iced cappuccino)  Fast food  Choose more often:  Garden salad  Mini subs, pita sandwiches ect with extra vegetables  plain burgers, grilled chicken  Vegetarian or cheese pizza with whole-grain crust    Avoid: Burgers/sandwiches with bacon, cheese, and high-fat sauces  [place] fries, fried chicken, fried fish, poutine, hash browns  Pizza with processed meats  Starters   Choose more often: Raw vegetables, salads (garden, spinach, fruit)  clear or vegetable soups  Seafood cocktail  Whole-grain breads and rolls    Avoid: Salads with high-fat dressings or toppings  Creamy soups  Wings, egg rolls  onion rings, nachos  White or garlic bread  Main courses Grains & Starches (amount equal to  of your plate)  Choose more often:  Oatmeal, high-fiber/lower-sugar cereals  Whole-grain breads, rice, pasta, barley, couscous  Sweet potatoes    Avoid: Sugary, low-fiber cereals  Large bagels, muffins, croissants, white bread  [place] fries, hash browns, fried rice   Meat and alternative (amount  equal to  of your plate)  Choose more often:  Lean meats, poultry, fish, eggs, low-fat cheese  Tofu, vegetable protein Legumes (e.g. lentils, chickpeas, beans)    Avoid: High-salt and/or high-fat meats (e.g. ribs, wings, sausages, wieners, processed lunch meats, imposter meats)   Vegetables (amount equal to  of your plate)  Choose more often:  Salads ([place], garden, spinach), plain vegetables   Avoid:  Salads with creamy, high-fat dressings and   Vegetables on sandwiches ect toppings like bacon bits, croutons, cheese  Desserts  Choose more often:  Fresh fruit, frozen yogourt, skim milk latte    Avoid: Cakes, pies,  pastries, ice cream, cheesecake   Adopting a Healthy Lifestyle.   Weight: Know what a healthy weight is for you (roughly BMI <25) and aim to maintain this. You can calculate your body mass index on your smart phone. Unfortunately, this is not the most accurate measure of healthy weight, but it is the simplest measurement to use. A more accurate measurement involves body scanning which measures lean muscle, fat tissue and bony density. We do not have this equipment at [facility].    Diet: Aim for 7+ servings of fruits and vegetables daily Limit animal fats in diet for cholesterol and heart health - choose grass fed whenever available Avoid highly processed foods (fast food burgers, tacos, fried chicken, pizza, hot dogs, french fries)  Saturated fat comes in the form of butter, lard, coconut oil, margarine, partially hydrogenated oils, and fat in meat. These increase your risk of cardiovascular disease.  Use healthy plant oils, such as olive, canola, soy, corn, sunflower and peanut.  Whole foods such as fruits, vegetables and whole grains have fiber  Men need > 38 grams of fiber per day Women need > 25 grams of fiber per day  Load up on vegetables and fruits - one-half of your plate: Aim for color and variety, and remember that potatoes dont count. Go for whole grains  - one-quarter of your plate: Whole wheat, barley, wheat berries, quinoa, oats, brown rice, and foods made with them. If you want pasta, go with whole wheat pasta. Protein power - one-quarter of your plate: Fish, chicken, beans, and nuts are all healthy, versatile protein sources. Limit red meat. You need carbohydrates for energy! The type of carbohydrate is more important than the amount. Choose carbohydrates such as vegetables, fruits, whole grains, beans, and nuts in the place of white rice, white pasta, potatoes (baked or fried), macaroni and cheese, cakes, cookies, and donuts.  If youre thirsty, drink water. Coffee and tea are good in moderation, but skip sugary drinks and limit milk and dairy products to one or two daily servings. Keep sugar intake at 6 teaspoons or 24 grams or LESS       Exercise: Aim for 150 min of moderate intensity exercise weekly for heart health, and weights twice weekly for bone health Stay active - any steps are better than no steps! Aim for 7-9 hours of sleep daily      Adopting a Healthy Lifestyle.   Weight: Know what a healthy weight is for you (roughly BMI <25) and aim to maintain this. You can calculate your body mass index on your smart phone. Unfortunately, this is not the most accurate measure of healthy weight, but it is the simplest measurement to use. A more accurate measurement involves body scanning which measures lean muscle, fat tissue and bony density. We do not have this equipment at [facility].    Diet: Aim for 7+ servings of fruits and vegetables daily Limit animal fats in diet for cholesterol and heart health - choose grass fed whenever available Avoid highly processed foods (fast food burgers, tacos, fried chicken, pizza, hot dogs, french fries)  Saturated fat comes in the form of butter, lard, coconut oil, margarine, partially hydrogenated oils, and fat in meat. These increase your risk of cardiovascular disease.  Use healthy plant oils, such  as olive, canola, soy, corn, sunflower and peanut.  Whole foods such as fruits, vegetables and whole grains have fiber  Men need > 38 grams of fiber per day Women need > 25 grams of  fiber per day  Load up on vegetables and fruits - one-half of your plate: Aim for color and variety, and remember that potatoes dont count. Go for whole grains - one-quarter of your plate: Whole wheat, barley, wheat berries, quinoa, oats, brown rice, and foods made with them. If you want pasta, go with whole wheat pasta. Protein power - one-quarter of your plate: Fish, chicken, beans, and nuts are all healthy, versatile protein sources. Limit red meat. You need carbohydrates for energy! The type of carbohydrate is more important than the amount. Choose carbohydrates such as vegetables, fruits, whole grains, beans, and nuts in the place of white rice, white pasta, potatoes (baked or fried), macaroni and cheese, cakes, cookies, and donuts.  If youre thirsty, drink water. Coffee and tea are good in moderation, but skip sugary drinks and limit milk and dairy products to one or two daily servings. Keep sugar intake at 6 teaspoons or 24 grams or LESS       Exercise: Aim for 150 min of moderate intensity exercise weekly for heart health, and weights twice weekly for bone health Stay active - any steps are better than no steps! Aim for 7-9 hours of sleep daily

## 2023-12-23 NOTE — Progress Notes (Signed)
 Cardiology Office Note:  .   Date:  12/23/2023  ID:  Dean Morris, DOB 01-21-1956, MRN [id number] PCP: [name], DO  [facility] Providers Cardiologist:  Jodelle Red, MD    Patient Profile: .      PMH Coronary artery disease Stent to OM 2013 Hypertension Hyperlipidemia PAF on chronic anticoagulation Intolerance of amiodarone Sinus bradycardia Chronic HFpEF OSA  He established with cardiology December 2021, seen by Dr. Cristal Deer.  He was referred to establish cardiac care. History of OM stent in 2013. Per notes in Care Everywhere, he had A-fib RVR initially diagnosed January 2018 when he was very ill.  Echo 2021 revealed normal LVEF 60 to 65%, severe LVH, indeterminate diastolic parameters, mild dilatation of ascending aorta 40 mm.  He underwent cardioversion 10/2021 but did not maintain sinus rhythm beyond 1 week.  He was started on amiodarone 11/24/2021.  Last cardiology clinic visit was 05/28/2023 with Gillian Shields, NP.  He reported discontinuation of amiodarone due to stomach tightness but symptoms had not resolved.  EKG revealed sinus bradycardia at 49 bpm, RBBB.  He reported exertional dyspnea somewhat improved from previous visit.  He inquired about a small oxygen machine which belonged to a neighbor's sister which he was using 10 to 20 minutes a day.  He had not been monitoring BP at home.  BP in clinic was elevated.  PCP had increased amlodipine to 10 mg daily.  He was advised to monitor home BP and notify us with plan to transition from losartan to valsartan if BP remained elevated.  CBC was unremarkable and 6 mo follow-up was recommended.        History of Present Illness: .    History of Present Illness Dean Morris is a pleasant 68 y.o. male who is here today for follow-up of CAD. He reports shortness of breath, particularly when walking to the mailbox. This has been a longstanding issue. He denies chest pain but reports occasional discomfort in the  chest, shoulders, neck, and left arm, which is worse when sitting. He denies lightheadedness, presyncope or syncope. He has difficulty with bowel movements, describing them as hard, and occasional blood in the stool. He has stopped taking stool softeners and is considering fiber tablets. History of sleep apnea and has tried multiple masks without success; had significant anxiety with CPAP mask. He reports difficulty breathing when laying flat and sleeping on two pillows, but he has been sleeping this way for a while. He denies any recent changes in this regard. No PND or edema. Reports cooking for himself, recently he had chicken, carrots, onions, and potatoes. He reports some difficulty with dry, irritated skin.    Discussed the use of AI scribe software for clinical note transcription with the patient, who gave verbal consent to proceed.   ROS: + SOB       Studies Reviewed: Marland Kitchen         No results found for: "LIPOA"   Risk Assessment/Calculations:    CHA2DS2-VASc Score = 3   This indicates a 3.2% annual risk of stroke. The patient's score is based upon: CHF History: 0 HTN History: 1 Diabetes History: 0 Stroke History: 0 Vascular Disease History: 1 Age Score: 1 Gender Score: 0            Physical Exam:   VS:  BP (!) 111/58   Pulse 70   Ht 5\' 6"  (1.676 m)   Wt 236 lb (107 kg)   SpO2 96%   BMI 38.09  kg/m    Wt Readings from Last 3 Encounters:  12/23/23 236 lb (107 kg)  09/27/23 247 lb (112 kg)  05/28/23 264 lb 4.8 oz (119.9 kg)    GEN: Well nourished, well developed in no acute distress NECK: No JVD; No carotid bruits CARDIAC: RRR, no murmurs, rubs, gallops RESPIRATORY:  Clear to auscultation without rales, wheezing or rhonchi  ABDOMEN: Soft, non-tender, non-distended EXTREMITIES:  No edema; No deformity     ASSESSMENT AND PLAN: .    Assessment & Plan Dyspnea on exertion   He experiences shortness of breath during minimal exertion. No chest pain, orthopnea, PND, or  edema.  Admits he is likely out of shape. Hyperdynamic LV function, mild LVH, normal diastolic parameters, normal RV on echo 12/25/2022.  We are repeating echo to evaluate aortic dilatation and will assess for wall motion abnormality as well as worsening heart function. Encouraged him to gradually increase exercise for goal of at least 30 minutes five days a week and report any chest pain or worsening dyspnea with exertion. Consider stress test if symptoms persist.  CAD Remote history of stent to OM in 2013.  He reports dyspnea with minimal exertion, such as walking to the mailbox.  We are getting echo as noted above for evaluation of heart function.  Consider stress testing if symptoms persist.  Heart healthy diet and regular exercise encouraged.  Thoracic aortic dilation   Echocardiogram 12/2022 showed aortic dilatation 4.2 cm. We will repeat echo for surveillance. No acute concerns today.   PAF on chronic anticoagulation  No return of A-fib to his awareness.  Clinically appears to be in sinus rhythm today.  HR is well-controlled.  No bleeding concerns.  Continue Eliquis 5 mg twice daily which is appropriate dose for stroke prevention for CHA2DS2-VASc score of 3.  Hypertension   BP is well controlled. We are rechecking renal function to ensure stability.  Continue metoprolol, losartan, amlodipine for antihypertensive therapy.  Hyperlipidemia LDL goal < 70  No recent lipid panel to review.  We will get fasting lipid and complete metabolic panel today. Continue atorvastatin.   Sleep apnea   He had significant anxiety with CPAP mask. Does not wish to consider additional devices at this time. He feels that he sleeps pretty well.   Constipation   He reports hard bowel movements and occasional blood, likely due to straining. Recommend dietary fiber intake, including foods like broccoli, apples with skin, and pears. Suggest using Miralax or fiber tablets if needed.  Dry skin   He has dry skin on the  back, likely not shingles as it crosses the midline. Recommend using Aquaphor or similar lotion to moisturize the affected area and suggest using long Q-tips to apply lotion to hard-to-reach areas.        Disposition:1 year  Signed, Slater Duncan, NP-C

## 2023-12-24 ENCOUNTER — Telehealth (HOSPITAL_BASED_OUTPATIENT_CLINIC_OR_DEPARTMENT_OTHER): Payer: Self-pay

## 2023-12-24 DIAGNOSIS — E785 Hyperlipidemia, unspecified: Secondary | ICD-10-CM

## 2023-12-24 LAB — COMPREHENSIVE METABOLIC PANEL WITH GFR
ALT: 15 IU/L (ref 0–44)
AST: 15 IU/L (ref 0–40)
Albumin: 4.4 g/dL (ref 3.9–4.9)
Alkaline Phosphatase: 97 IU/L (ref 44–121)
BUN/Creatinine Ratio: 13 (ref 10–24)
BUN: 18 mg/dL (ref 8–27)
Bilirubin Total: 0.5 mg/dL (ref 0.0–1.2)
CO2: 22 mmol/L (ref 20–29)
Calcium: 9.3 mg/dL (ref 8.6–10.2)
Chloride: 105 mmol/L (ref 96–106)
Creatinine, Ser: 1.44 mg/dL — ABNORMAL HIGH (ref 0.76–1.27)
Globulin, Total: 2.3 g/dL (ref 1.5–4.5)
Glucose: 87 mg/dL (ref 70–99)
Potassium: 4.6 mmol/L (ref 3.5–5.2)
Sodium: 143 mmol/L (ref 134–144)
Total Protein: 6.7 g/dL (ref 6.0–8.5)
eGFR: 53 mL/min/{1.73_m2} — ABNORMAL LOW (ref >59)

## 2023-12-24 LAB — LIPID PANEL
Chol/HDL Ratio: 5.8 ratio — ABNORMAL HIGH (ref 0.0–5.0)
Cholesterol, Total: 144 mg/dL (ref 100–199)
HDL: 25 mg/dL — ABNORMAL LOW (ref >39)
LDL Chol Calc (NIH): 84 mg/dL (ref 0–99)
Triglycerides: 207 mg/dL — ABNORMAL HIGH (ref 0–149)
VLDL Cholesterol Cal: 35 mg/dL (ref 5–40)

## 2023-12-24 NOTE — Telephone Encounter (Addendum)
 Called patient, no answer, unable to leave message.    ----- Message from Gerldine Koch sent at 12/24/2023  4:32 PM EDT ----- Creatinine is elevated but kidney function is stable. Would recommend he aim to drink at least 48 ounces of water daily but do not exceed 2 liters of fluid daily (64 ounces). Liver function is stable. Triglycerides and LDL cholesterol are above goal. Would recommend we add ezetimibe 10 mg daily and recheck lipid and ALT in 2-3 months. Focus on secondary prevention including heart healthy mostly plant based diet avoiding saturated fat, processed foods, simple carbohydrates, and sugar along with aiming for at least 150 minutes of moderate intensity exercise each week.

## 2023-12-25 MED ORDER — EZETIMIBE 10 MG PO TABS: 10.0000 mg | ORAL_TABLET | Freq: Every day | ORAL | 3 refills | Status: AC

## 2023-12-25 NOTE — Telephone Encounter (Signed)
 Called and spoke to pt. Discussed lab results and APPs recommendations. He is in agreement with plan.  Labs slips mailed to pt & new rx sent to pharmacy.

## 2023-12-25 NOTE — Telephone Encounter (Signed)
Pt returning nurse call. Please advise.

## 2023-12-25 NOTE — Addendum Note (Signed)
 Addended by: Zorita Hiss on: 12/25/2023 09:24 AM   Modules accepted: Orders

## 2023-12-26 ENCOUNTER — Other Ambulatory Visit (HOSPITAL_BASED_OUTPATIENT_CLINIC_OR_DEPARTMENT_OTHER): Payer: Self-pay

## 2023-12-26 ENCOUNTER — Telehealth (HOSPITAL_BASED_OUTPATIENT_CLINIC_OR_DEPARTMENT_OTHER): Payer: Self-pay

## 2023-12-26 DIAGNOSIS — I480 Paroxysmal atrial fibrillation: Secondary | ICD-10-CM

## 2023-12-26 NOTE — Telephone Encounter (Signed)
 A user error has taken place: orders placed in error, not carried out on this patient.

## 2023-12-26 NOTE — Telephone Encounter (Signed)
 Refill request

## 2023-12-27 ENCOUNTER — Other Ambulatory Visit (HOSPITAL_BASED_OUTPATIENT_CLINIC_OR_DEPARTMENT_OTHER): Payer: Self-pay

## 2023-12-27 MED ORDER — APIXABAN 5 MG PO TABS
5.0000 mg | ORAL_TABLET | Freq: Two times a day (BID) | ORAL | 1 refills | Status: DC
  Filled 2023-12-27: qty 180, 90d supply, fill #0

## 2023-12-27 NOTE — Addendum Note (Signed)
 Addended by: Sunny English on: 12/27/2023 08:34 AM   Modules accepted: Orders

## 2023-12-27 NOTE — Telephone Encounter (Signed)
 Prescription refill request for Eliquis  received. Indication: a fib Last office visit: 12/23/23 Scr: 1.44 epic 12/23/23 Age: 68 Weight: 107kg

## 2023-12-30 ENCOUNTER — Other Ambulatory Visit: Payer: Self-pay | Admitting: Pharmacist Clinician (PhC)/ Clinical Pharmacy Specialist

## 2023-12-30 ENCOUNTER — Other Ambulatory Visit (HOSPITAL_BASED_OUTPATIENT_CLINIC_OR_DEPARTMENT_OTHER): Payer: Self-pay

## 2023-12-30 DIAGNOSIS — I480 Paroxysmal atrial fibrillation: Secondary | ICD-10-CM

## 2023-12-30 MED ORDER — APIXABAN 5 MG PO TABS: 5.0000 mg | ORAL_TABLET | Freq: Two times a day (BID) | ORAL | 1 refills | Status: DC

## 2024-01-01 ENCOUNTER — Ambulatory Visit (INDEPENDENT_AMBULATORY_CARE_PROVIDER_SITE_OTHER)

## 2024-01-01 DIAGNOSIS — I480 Paroxysmal atrial fibrillation: Secondary | ICD-10-CM

## 2024-01-01 LAB — ECHOCARDIOGRAM COMPLETE
AR max vel: 2 cm2
AV Area VTI: 2.1 cm2
AV Area mean vel: 1.96 cm2
AV Mean grad: 4 mmHg
AV Peak grad: 8.6 mmHg
AV Vena cont: 0.32 cm
Ao pk vel: 1.47 m/s
Area-P 1/2: 3.21 cm2
P 1/2 time: 736 ms
S' Lateral: 3.35 cm

## 2024-01-02 ENCOUNTER — Telehealth (HOSPITAL_BASED_OUTPATIENT_CLINIC_OR_DEPARTMENT_OTHER): Payer: Self-pay

## 2024-01-02 DIAGNOSIS — I480 Paroxysmal atrial fibrillation: Secondary | ICD-10-CM

## 2024-01-02 DIAGNOSIS — I10 Essential (primary) hypertension: Secondary | ICD-10-CM

## 2024-01-02 NOTE — Telephone Encounter (Addendum)
 Results called to patient who verbalizes understanding! Repeat testing ordered.    ----- Message from Clearnce Curia sent at 01/02/2024  7:53 AM EDT ----- Echocardiogram normal heart pumping function.  Mild dilation ascending aorta 43 mm.  Recommend optimal BP control to goal less than 130/80 to prevent progression.  Repeat echocardiogram in 1 year for monitoring.  No significant abnormalities that would contribute to dyspnea.

## 2024-01-14 NOTE — Telephone Encounter (Signed)
 This encounter was created in error - please disregard.

## 2024-01-14 NOTE — Patient Outreach (Unsigned)
 Complex Care Management   Visit Note  01/14/2024  Name:  Dean Morris MRN: [id number] DOB: 11/22/1955  Situation: Referral received for Complex Care Management related to  Insurance  I obtained verbal consent from Patient.  Visit completed with patient  on the phone  Background:   Past Medical History:  Diagnosis Date   Allergy    seasonal allergies   Anxiety    Arthritis    bilateral hands   Asthma    uses inhaler   CAD (coronary artery disease)    stent   Cholelithiasis    Chronic kidney disease    CKD-   Depression    GERD (gastroesophageal reflux disease)    not on meds at this time   Gout    Hyperlipidemia    on meds   Hypertension    not on meds at this time   Myocardial infarction [facility]) 2011   hx of   Paroxysmal A-fib (HCC)     Assessment:  This morning, I received a telephone call from Mr. Dean Morris, who reported that he has been receiving frequent calls from a woman attempting to obtain his Medicare red, white, and blue card number. He indicated that she contacted him eight times yesterday. Furthermore, he received a letter concerning this matter, but he does not comprehend its contents. I informed him that we have an appointment scheduled for tomorrow, during which we can initiate a three-way call to his insurance company to clarify the situation.      11/05/2023    2:39 PM  Depression screen PHQ 2/9  Decreased Interest 1  Down, Depressed, Hopeless 2  PHQ - 2 Score 3  Altered sleeping 0  Tired, decreased energy 3  Change in appetite 0  Feeling bad or failure about yourself  0  Trouble concentrating 0  Moving slowly or fidgety/restless 0  Suicidal thoughts 0  PHQ-9 Score 6  Difficult doing work/chores Not difficult at all    There were no vitals filed for this visit.  Medications Reviewed Today   Medications were not reviewed in this encounter     Recommendation:   Will do a 3-way call on our next visit  to the insurance company  Follow Up Plan:    Face to Face appointment date/time: 01/15/24  130 pm  Augustin Leber RN, BSN, [facility] [place]  [facility], [facility]  Care Coordinator Phone: [phone]

## 2024-01-15 ENCOUNTER — Other Ambulatory Visit: Payer: Self-pay

## 2024-01-15 NOTE — Patient Outreach (Signed)
 Complex Care Management   Visit Note  01/15/2024  Name:  Dean Morris MRN: [id number] DOB: [birth year]  Situation: Referral received for Complex Care Management related to  Insurance  I obtained verbal consent from Patient.  Visit completed with patient  on the phone  Background:   Past Medical History:  Diagnosis Date   Allergy    seasonal allergies   Anxiety    Arthritis    bilateral hands   Asthma    uses inhaler   CAD (coronary artery disease)    stent   Cholelithiasis    Chronic kidney disease    CKD-   Depression    GERD (gastroesophageal reflux disease)    not on meds at this time   Gout    Hyperlipidemia    on meds   Hypertension    not on meds at this time   Myocardial infarction [facility]) 2011   hx of   Paroxysmal A-fib (HCC)     Assessment:  Today, I had a call with Mr. Hargens to help him understand his insurance and benefits, as he was quite confused. We conducted a three-way call with [facility] and spoke with Gorman Laughter, who clarified that Mr. Heyes has dual complete insurance and that [facility] is within his network. Gorman Laughter also explained that Medicaid will cover his co-pays and assist with his medications. Additionally, Mr. Soldo receives a U-card, which can be used for food and utilities. I felt relieved to provide him with this information. Now, we can focus on his health and arrange an appointment for him to be seen in the office.   There were no vitals filed for this visit.  Medications Reviewed Today   Medications were not reviewed in this encounter     Recommendation:   PCP Follow-up  Follow Up Plan:   Telephone follow up appointment date/time:  01/31/24  215 pm  Augustin Leber RN, BSN, [facility]  [facility], [facility]  Care Coordinator Phone: [phone]

## 2024-01-15 NOTE — Telephone Encounter (Signed)
 This encounter was created in error - please disregard.

## 2024-01-27 ENCOUNTER — Other Ambulatory Visit: Payer: Self-pay | Admitting: Cardiology

## 2024-01-27 DIAGNOSIS — I480 Paroxysmal atrial fibrillation: Secondary | ICD-10-CM

## 2024-01-31 ENCOUNTER — Other Ambulatory Visit: Payer: Self-pay

## 2024-01-31 NOTE — Patient Instructions (Signed)
 Visit Information  Thank you for taking time to visit with me today. Please don't hesitate to contact me if I can be of assistance to you before our next scheduled appointment.  Your next care management appointment is scheduled for:  03/03/24  215 pm   Please call the care guide team at [phone] if you need to cancel, schedule, or reschedule an appointment.   Please call 1-800-273-TALK (toll free, 24 hour hotline) if you are experiencing a Mental Health or Behavioral Health Crisis or need someone to talk to.  Augustin Leber RN, BSN, [facility]  [facility], [facility]  Care Coordinator Phone: [phone]

## 2024-01-31 NOTE — Patient Outreach (Signed)
 Complex Care Management   Visit Note  01/31/2024  Name:  Dean Morris MRN: [id number] DOB: Nov 13, 1955  Situation: Referral received for Complex Care Management related to Heart Failure and Atrial Fibrillation I obtained verbal consent from Patient.  Visit completed with patient  on the phone  Background:   Past Medical History:  Diagnosis Date   Allergy    seasonal allergies   Anxiety    Arthritis    bilateral hands   Asthma    uses inhaler   CAD (coronary artery disease)    stent   Cholelithiasis    Chronic kidney disease    CKD-   Depression    GERD (gastroesophageal reflux disease)    not on meds at this time   Gout    Hyperlipidemia    on meds   Hypertension    not on meds at this time   Myocardial infarction [facility]) 2011   hx of   Paroxysmal A-fib (HCC)     Assessment: Patient Reported Symptoms:  Cognitive Cognitive Status: Able to follow simple commands, Alert and oriented to person, place, and time, Normal speech and language skills      Neurological Neurological Review of Symptoms: No symptoms reported    HEENT HEENT Symptoms Reported: Change or loss of hearing, No symptoms reported      Cardiovascular Cardiovascular Symptoms Reported: Chest pain or discomfort Does patient have uncontrolled Hypertension?: No Cardiovascular Conditions: Chest pain Cardiovascular Management Strategies: Medication therapy (use inhaler)  Respiratory Respiratory Symptoms Reported: Shortness of breath Additional Respiratory Details: shortof breeath from walking long distance Respiratory Conditions: Shortness of breath  Endocrine Patient reports the following symptoms related to hypoglycemia or hyperglycemia : No symptoms reported    Gastrointestinal Gastrointestinal Symptoms Reported: Constipation Gastrointestinal Conditions: Constipation Gastrointestinal Management Strategies: Medication therapy Gastrointestinal Comment: takes prune juice    Genitourinary Genitourinary  Symptoms Reported: Other Other Genitourinary Symptoms: prostate Genitourinary Conditions: Unable to void/empty Genitourinary Management Strategies: Medication therapy  Integumentary Integumentary Symptoms Reported: Bruising Additional Integumentary Details: bruise on left hand and arm and on right that comes and goes Skin Management Strategies: Adequate rest  Musculoskeletal Musculoskelatal Symptoms Reviewed: Difficulty walking Musculoskeletal Conditions: Back pain, Mobility limited Musculoskeletal Management Strategies: Coping strategies Falls in the past year?: No    Psychosocial       Quality of Family Relationships: supportive Do you feel physically threatened by others?: No      01/31/2024    2:31 PM  Depression screen PHQ 2/9  Decreased Interest 0  Down, Depressed, Hopeless 1  PHQ - 2 Score 1    There were no vitals filed for this visit.  Medications Reviewed Today     Reviewed by Augustin Leber, RN (Registered Nurse) on 01/31/24 at 1419  Med List Status: <None>   Medication Order Taking? Sig Documenting Provider Last Dose Status Informant  acetaminophen  (TYLENOL ) 500 MG tablet [id number] Yes Take 1,000 mg by mouth every 8 (eight) hours as needed for moderate pain. [provider] Taking Active Self  allopurinol  (ZYLOPRIM ) 100 MG tablet [id number] Yes TAKE 1 TABLET BY MOUTH DAILY Dahbura, Anton, DO Taking Active   amLODipine  (NORVASC ) 10 MG tablet [id number] Yes Take 1 tablet (10 mg total) by mouth at bedtime. Dahbura, Anton, DO Taking Active   apixaban  (ELIQUIS ) 5 MG TABS tablet [id number] Yes Take 1 tablet (5 mg total) by mouth 2 (two) times daily. Sheryle Donning, MD Taking Active   atorvastatin  (LIPITOR) 40 MG tablet [id number] Yes TAKE 1 TABLET(40 MG)  BY MOUTH DAILY Veronia Goon, DO Taking Active   ePHEDrine-guaiFENesin (PRIMATENE ASTHMA) 12.5-200 MG TABS [id number] Yes Take 1 spray by mouth in the morning and at bedtime. [provider] Taking  Active Self  ezetimibe  (ZETIA ) 10 MG tablet [id number] Yes Take 1 tablet (10 mg total) by mouth daily. Swinyer, Leilani Punter, NP Taking Active   furosemide  (LASIX ) 40 MG tablet [id number] Yes Take 40 mg by mouth daily. [provider] Taking Active            Med Note Sharion Davidson, MICHELE   Mon Dec 23, 2023  1:21 PM)    loratadine  (CLARITIN ) 10 MG tablet [id number] Yes Take 1 tablet (10 mg total) by mouth daily. Azell Boll, MD Taking Active   losartan  (COZAAR ) 100 MG tablet [id number] Yes TAKE 1 TABLET(100 MG) BY MOUTH AT BEDTIME Sheryle Donning, MD Taking Active   metoprolol  tartrate (LOPRESSOR ) 100 MG tablet [id number] Yes Take 1 tablet (100 mg total) by mouth 2 (two) times daily. Sheryle Donning, MD Taking Active   nitroGLYCERIN  (NITROSTAT ) 0.4 MG SL tablet [id number] Yes Place 1 tablet (0.4 mg total) under the tongue every 5 (five) minutes as needed for chest pain. Glenn Lange, DO Taking Active Self            Recommendation:   PCP Follow-up  Follow Up Plan:   Telephone follow up appointment with care management team member scheduled for:  03/03/24  215 pm    Augustin Leber RN, BSN, [facility] [place]  [facility], [facility]  Care Coordinator Phone: [phone]

## 2024-02-05 ENCOUNTER — Telehealth: Payer: Self-pay

## 2024-02-05 NOTE — Patient Outreach (Signed)
 Complex Care Management   Visit Note  02/05/2024  Name:  Dean Morris MRN: [id number] DOB: 12/24/1955  Situation: Complex Care Management related to Insurance I obtained verbal consent from patient.  Visit completed with patient  on the phone  Background:   Past Medical History:  Diagnosis Date   Allergy    seasonal allergies   Anxiety    Arthritis    bilateral hands   Asthma    uses inhaler   CAD (coronary artery disease)    stent   Cholelithiasis    Chronic kidney disease    CKD-   Depression    GERD (gastroesophageal reflux disease)    not on meds at this time   Gout    Hyperlipidemia    on meds   Hypertension    not on meds at this time   Myocardial infarction [facility]) 2011   hx of   Paroxysmal A-fib (HCC)     Assessment:  I received a correspondence from Dean Morris indicating that he believed his insurance coverage had been canceled. To verify this matter, I contacted Dean Morris at [facility], who confirmed that his insurance remains active. I subsequently informed the patient of this clarification during our call today. He expressed relief regarding the situation and indicated that he intends to schedule his appointment for Feb 07, 2024, at our office.     There were no vitals filed for this visit.  Medications Reviewed Today   Medications were not reviewed in this encounter     Recommendation:   PCP Follow-up  Follow Up Plan: RNCM will follow up at the next scheduled Interval.   Augustin Leber RN, BSN, [facility]  [facility], [facility]  Care Coordinator Phone: [phone]

## 2024-02-07 ENCOUNTER — Encounter: Payer: Self-pay | Admitting: Student

## 2024-02-07 ENCOUNTER — Ambulatory Visit (INDEPENDENT_AMBULATORY_CARE_PROVIDER_SITE_OTHER): Admitting: Student

## 2024-02-07 VITALS — BP 132/66 | HR 50 | Ht 66.0 in | Wt 234.8 lb

## 2024-02-07 DIAGNOSIS — I10 Essential (primary) hypertension: Secondary | ICD-10-CM | POA: Diagnosis not present

## 2024-02-07 DIAGNOSIS — M10071 Idiopathic gout, right ankle and foot: Secondary | ICD-10-CM | POA: Diagnosis not present

## 2024-02-07 DIAGNOSIS — Z1211 Encounter for screening for malignant neoplasm of colon: Secondary | ICD-10-CM

## 2024-02-07 NOTE — Patient Instructions (Addendum)
 It was great to see you today! Thank you for choosing [facility] for your primary care.  Today we addressed: Recheck your gout levels and screen for colon cancer.  Your blood pressure looks great, nice job.  If you haven't already, sign up for My Chart to have easy access to your labs results, and communication with your primary care physician. We are checking some labs today. If they are abnormal, I will call you. If they are normal, I will send you a MyChart message (if it is active) or a letter in the mail. If you do not hear about your labs in the next 2 weeks, please call the office. Return if symptoms worsen or fail to improve. Please arrive 15 minutes before your appointment to ensure smooth check in process.  We appreciate your efforts in making this happen.  Thank you for allowing me to participate in your care, Veronia Goon, DO 02/07/2024, 2:57 PM PGY-3, [facility]

## 2024-02-07 NOTE — Assessment & Plan Note (Signed)
 Well-managed on allopurinol  100 mg daily, uric acid goal <6.  Recheck today.

## 2024-02-07 NOTE — Assessment & Plan Note (Signed)
 BP: 132/66 today. Well controlled. Goal of <140. Medication regimen: Amlodipine  10 mg daily, losartan  100 mg daily, metoprolol  100 mg twice daily.  CAD, hyperlipidemia, A-fib otherwise managed by cardiology.

## 2024-02-07 NOTE — Progress Notes (Signed)
  SUBJECTIVE:   CHIEF COMPLAINT / HPI:   Dean Morris is a 68 year old male who presents for a routine checkup.  He is compliant with his medication regimen, including allopurinol , amlodipine , losartan , atorvastatin , Zetia , Eliquis , and metoprolol . He has not experienced recent gout flares. Recent blood work led to adjustments in his cholesterol medication.  His diet includes salads and fruits, with occasional indulgence in less healthy foods. He bruises easily and experiences sleep difficulties, particularly when watching movies late at night. He has quit smoking marijuana.  PERTINENT  PMH / PSH: HTN, HLD, CAD s/p stent, paroxysmal A-fib on Eliquis , GERD   OBJECTIVE:  BP 132/66   Pulse (!) 50   Ht 5\' 6"  (1.676 m)   Wt 234 lb 12.8 oz (106.5 kg)   SpO2 98%   BMI 37.90 kg/m  General: Well-appearing, NAD CV: RRR, no murmurs appreciable Pulm: Normal WOB  ASSESSMENT/PLAN:   Assessment & Plan Essential hypertension BP: 132/66 today. Well controlled. Goal of <140. Medication regimen: Amlodipine  10 mg daily, losartan  100 mg daily, metoprolol  100 mg twice daily.  CAD, hyperlipidemia, A-fib otherwise managed by cardiology. Acute idiopathic gout of right foot Well-managed on allopurinol  100 mg daily, uric acid goal <6.  Recheck today. Screen for colon cancer Declined colonoscopy, obtain FOBT Return if symptoms worsen or fail to improve. Veronia Goon, DO 02/07/2024, 3:19 PM PGY-3, [facility]

## 2024-02-08 LAB — URIC ACID: Uric Acid: 6.5 mg/dL (ref 3.8–8.4)

## 2024-02-09 ENCOUNTER — Ambulatory Visit: Payer: Self-pay | Admitting: Student

## 2024-02-09 ENCOUNTER — Other Ambulatory Visit: Payer: Self-pay | Admitting: Student

## 2024-02-09 DIAGNOSIS — I10 Essential (primary) hypertension: Secondary | ICD-10-CM

## 2024-02-10 ENCOUNTER — Other Ambulatory Visit: Payer: Self-pay | Admitting: Cardiology

## 2024-02-14 ENCOUNTER — Telehealth: Payer: Self-pay

## 2024-02-19 NOTE — Patient Outreach (Signed)
 Complex Care Management   Visit Note  02/14/2024 Late Entry  Name:  Dean Morris MRN: [id number] DOB: [birth year]  Situation: Referral received for Complex Care Management related to Gallbladder I obtained verbal consent from Patient.  Visit completed with patient  on the phone  Background:   Past Medical History:  Diagnosis Date   Allergy    seasonal allergies   Anxiety    Arthritis    bilateral hands   Asthma    uses inhaler   CAD (coronary artery disease)    stent   Cholelithiasis    Chronic kidney disease    CKD-   Depression    GERD (gastroesophageal reflux disease)    not on meds at this time   Gout    Hyperlipidemia    on meds   Hypertension    not on meds at this time   Myocardial infarction [facility]) 2011   hx of   Paroxysmal A-fib (HCC)     Assessment: the patient called to inform me that he felt he had a gallbladder attack.  He know that he has gallstones and wanted more information.  We talked about his gallstones and I advised him that he needs to discuss further with his physician for more in depth information and follow up.       02/07/2024    3:00 PM  Depression screen PHQ 2/9  Decreased Interest 1  Down, Depressed, Hopeless 1  PHQ - 2 Score 2  Altered sleeping 2  Tired, decreased energy 2  Change in appetite 1  Feeling bad or failure about yourself  1  Trouble concentrating 1  Moving slowly or fidgety/restless 1  Suicidal thoughts 0  PHQ-9 Score 10    There were no vitals filed for this visit.  Medications Reviewed Today   Medications were not reviewed in this encounter     Recommendation:   PCP Follow-up  Follow Up Plan: RNCM will follow up at the next scheduled Interval.   Augustin Leber RN, BSN, [facility] [place]  [facility], [facility]  Care Coordinator Phone: [phone]

## 2024-02-28 ENCOUNTER — Other Ambulatory Visit: Payer: Self-pay

## 2024-02-28 DIAGNOSIS — I10 Essential (primary) hypertension: Secondary | ICD-10-CM

## 2024-02-28 DIAGNOSIS — I480 Paroxysmal atrial fibrillation: Secondary | ICD-10-CM

## 2024-02-28 MED ORDER — LOSARTAN POTASSIUM 100 MG PO TABS: ORAL_TABLET | ORAL | 3 refills | Status: DC

## 2024-02-28 MED ORDER — METOPROLOL TARTRATE 100 MG PO TABS: 100.0000 mg | ORAL_TABLET | Freq: Two times a day (BID) | ORAL | 3 refills | Status: DC

## 2024-02-28 MED ORDER — NITROGLYCERIN 0.4 MG SL SUBL: 0.4000 mg | SUBLINGUAL_TABLET | SUBLINGUAL | 3 refills | Status: DC | PRN

## 2024-02-28 NOTE — Telephone Encounter (Signed)
 This encounter was created in error - please disregard.

## 2024-03-02 NOTE — Progress Notes (Unsigned)
  SUBJECTIVE:   CHIEF COMPLAINT / HPI:   Concern for sinus infection. Ongoing for 3 weeks which is improving. He is using nasal saline navage to help.  Denies significant nasal discharge or bleeding.  He has not used steroids or Afrin.  PERTINENT  PMH / PSH: HTN, HLD, CAD s/p stent, paroxysmal A-fib on Eliquis , GERD   OBJECTIVE:  BP 121/72   Pulse (!) 56   Ht 5' 6 (1.676 m)   Wt 236 lb 12.8 oz (107.4 kg)   SpO2 97%   BMI 38.22 kg/m  General: Well-appearing, NAD HEENT: No frontal or maxillary sinus tenderness, nasal passages clear without erythema or signs of infection  ASSESSMENT/PLAN:   Assessment & Plan Sinus congestion No evidence of sinus infection, continue nasal saline.  Avoid Afrin. Healthcare maintenance Provide another FOBT cup to submit FIT testing. Return if symptoms worsen or fail to improve. Dean Johnson, DO 03/03/2024, 11:01 AM PGY-3, [facility]

## 2024-03-03 ENCOUNTER — Other Ambulatory Visit: Payer: Self-pay

## 2024-03-03 ENCOUNTER — Encounter: Payer: Self-pay | Admitting: Student

## 2024-03-03 ENCOUNTER — Ambulatory Visit (INDEPENDENT_AMBULATORY_CARE_PROVIDER_SITE_OTHER): Admitting: Student

## 2024-03-03 VITALS — BP 121/72 | HR 56 | Ht 66.0 in | Wt 236.8 lb

## 2024-03-03 DIAGNOSIS — Z0000 Encounter for general adult medical examination without abnormal findings: Secondary | ICD-10-CM | POA: Diagnosis not present

## 2024-03-03 DIAGNOSIS — R0981 Nasal congestion: Secondary | ICD-10-CM | POA: Diagnosis not present

## 2024-03-03 NOTE — Patient Instructions (Addendum)
 It was great to see you today! Thank you for choosing [facility] for your primary care.  Today we addressed: Sinus congestion: Continue with nasal saline We will give you a new cup for stool sample.  When you submit them, please make sure to place your name on them if it is not already there.  If you haven't already, sign up for My Chart to have easy access to your labs results, and communication with your primary care physician.  Return if symptoms worsen or fail to improve. Please arrive 15 minutes before your appointment to ensure smooth check in process.  We appreciate your efforts in making this happen.  Thank you for allowing me to participate in your care, Kieth Johnson, DO 03/03/2024, 10:57 AM PGY-3, [facility]

## 2024-03-04 ENCOUNTER — Encounter: Payer: Self-pay | Admitting: Nurse Practitioner

## 2024-03-04 NOTE — Patient Outreach (Signed)
 Complex Care Management   Visit Note  03/04/2024  Name:  Dean Morris MRN: [id number] DOB: 1956/04/17  Situation: Referral received for Complex Care Management related to Heart Failure and Atrial Fibrillation I obtained verbal consent from Patient.  Visit completed with patient  on the phone  Background:   Past Medical History:  Diagnosis Date   Allergy    seasonal allergies   Anxiety    Arthritis    bilateral hands   Asthma    uses inhaler   CAD (coronary artery disease)    stent   Cholelithiasis    Chronic kidney disease    CKD-   Depression    GERD (gastroesophageal reflux disease)    not on meds at this time   Gout    Hyperlipidemia    on meds   Hypertension    not on meds at this time   Myocardial infarction [facility]) 2011   hx of   Paroxysmal A-fib (HCC)     Assessment: Patient Reported Symptoms:  Cognitive Cognitive Status: Able to follow simple commands, Alert and oriented to person, place, and time, Normal speech and language skills      Neurological Neurological Review of Symptoms: No symptoms reported    HEENT HEENT Symptoms Reported: Change or loss of hearing HEENT Comment: He does have some hearing loss but he does not wear a hearing aid.    Cardiovascular Cardiovascular Symptoms Reported: No symptoms reported Does patient have uncontrolled Hypertension?: No Cardiovascular Management Strategies: Medication therapy  Respiratory Respiratory Symptoms Reported: Shortness of breath Additional Respiratory Details: Short of breath when walking long distances Respiratory Conditions: Shortness of breath  Endocrine Patient reports the following symptoms related to hypoglycemia or hyperglycemia : No symptoms reported Is patient diabetic?: No    Gastrointestinal Gastrointestinal Symptoms Reported: Constipation Gastrointestinal Conditions: Constipation Gastrointestinal Management Strategies: Fluid modification    Genitourinary Genitourinary Symptoms Reported:  Other Other Genitourinary Symptoms: prostate Genitourinary Conditions: Unable to void/empty Genitourinary Management Strategies: Medication therapy  Integumentary Integumentary Symptoms Reported: Bruising    Musculoskeletal Musculoskelatal Symptoms Reviewed: Difficulty walking Musculoskeletal Conditions: Back pain, Mobility limited Musculoskeletal Management Strategies: Coping strategies Falls in the past year?: No    Psychosocial       Do you feel physically threatened by others?: No      03/03/2024    2:52 PM  Depression screen PHQ 2/9  Decreased Interest 0  Down, Depressed, Hopeless 1  PHQ - 2 Score 1    There were no vitals filed for this visit.  Medications Reviewed Today     Reviewed by Dean Corning, RN (Registered Nurse) on 03/03/24 at 1439  Med List Status: <None>   Medication Order Taking? Sig Documenting Provider Last Dose Status Informant  acetaminophen  (TYLENOL ) 500 MG tablet [id number] Yes Take 1,000 mg by mouth every 8 (eight) hours as needed for moderate pain. [provider]  Active Self  allopurinol  (ZYLOPRIM ) 100 MG tablet [id number] Yes TAKE 1 TABLET BY MOUTH DAILY Morris, Anton, DO  Active   amLODipine  (NORVASC ) 10 MG tablet [id number] Yes Take 1 tablet (10 mg total) by mouth at bedtime. Morris, Anton, DO  Active   apixaban  (ELIQUIS ) 5 MG TABS tablet [id number] Yes Take 1 tablet (5 mg total) by mouth 2 (two) times daily. Dean Slain, MD  Active   atorvastatin  (LIPITOR) 40 MG tablet [id number] Yes TAKE 1 TABLET(40 MG) BY MOUTH DAILY Morris, Anton, DO  Active   ezetimibe  (ZETIA ) 10 MG tablet [id number] Yes Take 1 tablet (  10 mg total) by mouth daily. Morris, Dean HERO, NP  Active   losartan  (COZAAR ) 100 MG tablet [id number] Yes TAKE 1 TABLET(100 MG) BY MOUTH AT BEDTIME Dean Slain, MD  Active   metoprolol  tartrate (LOPRESSOR ) 100 MG tablet [id number] Yes Take 1 tablet (100 mg total) by mouth 2 (two) times daily. Dean Slain, MD  Active   nitroGLYCERIN  (NITROSTAT ) 0.4 MG SL tablet [id number] Yes Place 1 tablet (0.4 mg total) under the tongue every 5 (five) minutes as needed for chest pain. Morris, Dean HERO, NP  Active             Recommendation:   Acute PCP follow-up with any concerns regarding his CHF or AFIB.  Follow Up Plan:   Telephone follow up appointment date/time:  04/02/24  230 pm  Dean Diver RN, BSN, [facility] [place]  [facility], [facility]    Care Coordinator Phone: [phone]

## 2024-03-04 NOTE — Telephone Encounter (Signed)
 Error

## 2024-03-04 NOTE — Patient Instructions (Signed)
 Visit Information  Thank you for taking time to visit with me today. Please don't hesitate to contact me if I can be of assistance to you before our next scheduled appointment.  Your next care management appointment is by telephone on 04/02/24 at 230 pm    Please call the care guide team at [phone] if you need to cancel, schedule, or reschedule an appointment.   Please call 1-800-273-TALK (toll free, 24 hour hotline) call 911 if you are experiencing a Mental Health or Behavioral Health Crisis or need someone to talk to.  Wilbert Diver RN, BSN, [facility]  [facility], [facility]    Care Coordinator Phone: [phone]

## 2024-03-09 ENCOUNTER — Telehealth: Payer: Self-pay | Admitting: Cardiology

## 2024-03-09 NOTE — Telephone Encounter (Signed)
 Pt c/o medication issue:  1. Name of Medication: apixaban  (ELIQUIS ) 5 MG TABS tablet   2. How are you currently taking this medication (dosage and times per day)? 5 mg, 2 times daily   3. Are you having a reaction (difficulty breathing--STAT)? No  4. What is your medication issue? Pt requesting cb to discuss issues with med refill and cost. Has questions

## 2024-03-09 NOTE — Telephone Encounter (Signed)
 Spoke with patient regarding Eliquis   Mail order reached out to him regarding Rx for eliquis  being mailed to him  He doesn't need at this time Advised patient to call mail order and explain does not need at this time, verbalized understanding

## 2024-03-17 ENCOUNTER — Other Ambulatory Visit: Payer: Self-pay

## 2024-03-17 DIAGNOSIS — I10 Essential (primary) hypertension: Secondary | ICD-10-CM

## 2024-03-17 MED ORDER — LOSARTAN POTASSIUM 100 MG PO TABS: ORAL_TABLET | ORAL | 2 refills | Status: AC

## 2024-03-20 ENCOUNTER — Telehealth: Payer: Self-pay | Admitting: Cardiology

## 2024-03-20 ENCOUNTER — Other Ambulatory Visit: Payer: Self-pay | Admitting: Cardiology

## 2024-03-20 ENCOUNTER — Other Ambulatory Visit: Payer: Self-pay

## 2024-03-20 DIAGNOSIS — I480 Paroxysmal atrial fibrillation: Secondary | ICD-10-CM

## 2024-03-20 DIAGNOSIS — E782 Mixed hyperlipidemia: Secondary | ICD-10-CM

## 2024-03-20 MED ORDER — METOPROLOL TARTRATE 100 MG PO TABS: 100.0000 mg | ORAL_TABLET | Freq: Two times a day (BID) | ORAL | 3 refills | Status: AC

## 2024-03-20 MED ORDER — ATORVASTATIN CALCIUM 40 MG PO TABS: 40.0000 mg | ORAL_TABLET | Freq: Every day | ORAL | 2 refills | Status: AC

## 2024-03-20 MED ORDER — NITROGLYCERIN 0.4 MG SL SUBL: 0.4000 mg | SUBLINGUAL_TABLET | SUBLINGUAL | 3 refills | Status: AC | PRN

## 2024-03-20 NOTE — Telephone Encounter (Signed)
 Refills has been sent to the pharmacy.

## 2024-03-20 NOTE — Telephone Encounter (Signed)
*  STAT* If patient is at the pharmacy, call can be transferred to refill team.   1. Which medications need to be refilled? (please list name of each medication and dose if known) metoprolol  tartrate (LOPRESSOR ) 100 MG tablet   nitroGLYCERIN  (NITROSTAT ) 0.4 MG SL tablet    NEW PHARMACY    4. Which pharmacy/location (including street and city if local pharmacy) is medication to be sent to?   [id number] - [place], [place] - [address] Phone: [phone]  Fax: [phone]       5. Do they need a 30 day or 90 day supply? 90

## 2024-03-20 NOTE — Telephone Encounter (Signed)
 Pt last saw Rosaline Bane, NP on 12/23/23, last labs 12/23/23 Creat 1.44, age 68, weight 107.4kg, based on specified criteria pt is on appropriate dosage of Eliquis  5mg  BID for afib.  Will refill rx.

## 2024-03-27 ENCOUNTER — Telehealth: Payer: Self-pay | Admitting: Cardiology

## 2024-03-27 NOTE — Telephone Encounter (Signed)
 Pt c/o swelling/edema: STAT if pt has developed SOB within 24 hours  If swelling, where is the swelling located? Bi-lat LE edema  How much weight have you gained and in what time span? 10 ilbs in last month  Have you gained 2 pounds in a day or 5 pounds in a week? -  Do you have a log of your daily weights (if so, list)? No  Are you currently taking a fluid pill? No  Are you currently SOB? No  Have you traveled recently in a car or plane for an extended period of time? No

## 2024-03-27 NOTE — Telephone Encounter (Signed)
 Contacted the office noting bilateral lower extremity edema and reported weight gain of 10 pounds over the last month.  Last seen in clinic 12/23/2023 with no reported edema. Echo 12/2023 normal LVEF 55-60%, normal diastolic parameters, mild dilation of ascending aorta 14 mm.  Called to discuss. On answering the phone he really had more questions about when to do repeat labs. Discussed repeat fasting lipids ordered for this month and to stop by for fasting labs, no appt needed.  Reports swelling a little in bilateral lower extremities. It is the same all day long. Reviewed normal echo, reassured. Notes increased sodium intake. Recommended reduce sodium, elevate legs, contact us  if no improvement.    Yuette Putnam S Emilly Lavey, NP

## 2024-03-31 ENCOUNTER — Telehealth: Payer: Self-pay

## 2024-03-31 NOTE — Telephone Encounter (Signed)
 Patient LVM on nurse line in regards to PCP.   He reports his transportation refused to pick him up for his scheduled apt.   He reports this makes him think he is no longer a patient of ours.   I do not see where the patient had an apt with us  recently.   I attempted to call him back to discuss further, however no answer or option for VM.   Patient is still a patient of ours at [facility].  PCP Dr. Janna.

## 2024-04-02 ENCOUNTER — Other Ambulatory Visit: Payer: Self-pay

## 2024-04-02 ENCOUNTER — Telehealth: Payer: Self-pay

## 2024-04-02 NOTE — Patient Outreach (Signed)
 Complex Care Management   Visit Note  04/02/2024  Name:  Dean Morris MRN: [id number] DOB: [birth year]  Situation: Referral received for Complex Care Management related to Heart Failure and Atrial Fibrillation I obtained verbal consent from Patient.  Visit completed with patient  on the phone  Background:   Past Medical History:  Diagnosis Date   Allergy    seasonal allergies   Anxiety    Arthritis    bilateral hands   Asthma    uses inhaler   CAD (coronary artery disease)    stent   Cholelithiasis    Chronic kidney disease    CKD-   Depression    GERD (gastroesophageal reflux disease)    not on meds at this time   Gout    Hyperlipidemia    on meds   Hypertension    not on meds at this time   Myocardial infarction [facility]) 2011   hx of   Paroxysmal A-fib (HCC)     Assessment: Patient Reported Symptoms:  Cognitive Cognitive Status: Able to follow simple commands, Alert and oriented to person, place, and time, Normal speech and language skills      Neurological Neurological Review of Symptoms: Dizziness Neurological Management Strategies: Adequate rest Neurological Comment: He feels that his medication makes him dizzy. He states he feels like he gets up too fast.  HEENT HEENT Symptoms Reported: Change or loss of hearing HEENT Comment: He does have some hearing loss but does not wear hearing aids.    Cardiovascular Does patient have uncontrolled Hypertension?: No Cardiovascular Management Strategies: Medication therapy  Respiratory Respiratory Symptoms Reported: Shortness of breath Additional Respiratory Details: Short of breath when laying on his back has to lay on his side Respiratory Management Strategies: Adequate rest  Endocrine Endocrine Symptoms Reported: No symptoms reported Is patient diabetic?: No    Gastrointestinal Gastrointestinal Symptoms Reported: Constipation Gastrointestinal Comment: Take prune juice    Genitourinary Genitourinary Symptoms  Reported: Difficulty initiating stream Other Genitourinary Symptoms: Prostate Genitourinary Management Strategies: Medication therapy  Integumentary Integumentary Symptoms Reported: Bruising Additional Integumentary Details: Bruise on left hand and arm and onright that comes and goes Skin Management Strategies: Adequate rest  Musculoskeletal Musculoskelatal Symptoms Reviewed: No symptoms reported   Falls in the past year?: No    Psychosocial       Quality of Family Relationships: supportive Do you feel physically threatened by others?: No      04/02/2024    9:39 AM  Depression screen PHQ 2/9  Decreased Interest 0  Down, Depressed, Hopeless 1  PHQ - 2 Score 1    There were no vitals filed for this visit.  Medications Reviewed Today     Reviewed by Weyman Corning, RN (Registered Nurse) on 04/02/24 at 0920  Med List Status: <None>   Medication Order Taking? Sig Documenting Provider Last Dose Status Informant  acetaminophen  (TYLENOL ) 500 MG tablet [id number] Yes Take 1,000 mg by mouth every 8 (eight) hours as needed for moderate pain. [provider]  Active Self  allopurinol  (ZYLOPRIM ) 100 MG tablet [id number] Yes TAKE 1 TABLET BY MOUTH DAILY Dahbura, Anton, DO  Active   amLODipine  (NORVASC ) 10 MG tablet [id number] Yes Take 1 tablet (10 mg total) by mouth at bedtime. Dahbura, Anton, DO  Active   atorvastatin  (LIPITOR) 40 MG tablet [id number] Yes Take 1 tablet (40 mg total) by mouth daily. Lonni Slain, MD  Active   ELIQUIS  5 MG TABS tablet [id number] Yes TAKE 1 TABLET(5 MG) BY MOUTH TWICE  DAILY Lonni Slain, MD  Active   ezetimibe  (ZETIA ) 10 MG tablet [id number] Yes Take 1 tablet (10 mg total) by mouth daily. Swinyer, Rosaline HERO, NP  Active   losartan  (COZAAR ) 100 MG tablet [id number] Yes TAKE 1 TABLET(100 MG) BY MOUTH AT BEDTIME Lonni Slain, MD  Active   metoprolol  tartrate (LOPRESSOR ) 100 MG tablet [id number] Yes Take 1 tablet (100 mg total) by mouth  2 (two) times daily. Swinyer, Rosaline HERO, NP  Active   nitroGLYCERIN  (NITROSTAT ) 0.4 MG SL tablet [id number] Yes Place 1 tablet (0.4 mg total) under the tongue every 5 (five) minutes as needed for chest pain. Swinyer, Rosaline HERO, NP  Active             Recommendation:   Continue Current Plan of Care  Follow Up Plan:   Telephone follow up appointment with care management team member scheduled for:  05/05/24 1030 am with Rosaline Finlay RNCM  Wilbert Diver RN, BSN, [facility]  [facility], [facility]    Care Coordinator Phone: [phone]

## 2024-04-02 NOTE — Patient Instructions (Signed)
 Visit Information  Thank you for taking time to visit with me today. Please don't hesitate to contact me if I can be of assistance to you before our next scheduled appointment.  Telephone follow up appointment with care management team member scheduled for:  05/05/24 1030 am with Rosaline Finlay [facility]  Please call the care guide team at [phone] if you need to cancel, schedule, or reschedule an appointment.   Please call the Suicide and Crisis Lifeline: 988 call the [place]  National Suicide Prevention Lifeline: (920) 680-1102 or TTY: 7853130406 TTY 9377060528) to talk to a trained counselor call 1-800-273-TALK (toll free, 24 hour hotline) call 911 if you are experiencing a Mental Health or Behavioral Health Crisis or need someone to talk to.  Wilbert Diver RN, BSN, [facility]  [facility], [facility] Health    Care Coordinator Phone: [phone]

## 2024-04-13 ENCOUNTER — Telehealth: Payer: Self-pay

## 2024-04-13 NOTE — Telephone Encounter (Signed)
 Tiffany NP with [employer] LVM on nurse line in regards to PAD score.   She reports normal PAD screening for left LE, however reports abnormal PAD screening for right LE.   She reports a score of 0.83. She reports no symptoms or complaints from patient. No swelling or shortness of breath.  Spoke with patient as he has an apt scheduled for 8/7 in [place]. He reports he feels ok and reports he would like to cancel this apt.  Offered to make a FU apt with PCP, however he declines at this time.   Precautions discussed with patient.

## 2024-04-16 ENCOUNTER — Ambulatory Visit

## 2024-05-04 ENCOUNTER — Other Ambulatory Visit: Payer: Self-pay

## 2024-05-04 DIAGNOSIS — I10 Essential (primary) hypertension: Secondary | ICD-10-CM

## 2024-05-04 MED ORDER — AMLODIPINE BESYLATE 10 MG PO TABS: 10.0000 mg | ORAL_TABLET | Freq: Every day | ORAL | 3 refills | Status: AC

## 2024-05-05 ENCOUNTER — Telehealth: Payer: Self-pay

## 2024-05-05 NOTE — Patient Outreach (Signed)
 Care Coordination   05/05/2024 Name: Dean Morris MRN: [id number] DOB: 1956-04-21   Care Coordination Outreach Attempts:  An unsuccessful outreach was attempted for an appointment today.  Follow Up Plan:  Additional outreach attempts will be made to complete CCM follow-up visit.   Encounter Outcome:  No Answer, no option to leave voicemail.   Rosaline Finlay, RN MSN [place]  [facility] Population Health RN Care Manager Direct Dial: [phone]  Fax: [phone]

## 2024-05-08 ENCOUNTER — Other Ambulatory Visit

## 2024-05-08 ENCOUNTER — Other Ambulatory Visit: Payer: Self-pay

## 2024-05-08 NOTE — Patient Outreach (Signed)
 Complex Care Management   Visit Note  05/08/2024  Name:  Dean Morris MRN: [id number] DOB: [birth year]  Situation: Referral received for Complex Care Management related to hypertension, hyperlipidemia, heart failure, and atrial fibrillation I obtained verbal consent from Patient.  Visit completed with Patient  on the phone  Background:   Past Medical History:  Diagnosis Date   Allergy    seasonal allergies   Anxiety    Arthritis    bilateral hands   Asthma    uses inhaler   CAD (coronary artery disease)    stent   Cholelithiasis    Chronic kidney disease    CKD-   Depression    GERD (gastroesophageal reflux disease)    not on meds at this time   Gout    Hyperlipidemia    on meds   Hypertension    not on meds at this time   Myocardial infarction [facility]) 2011   hx of   Paroxysmal A-fib (HCC)     Assessment: Patient Reported Symptoms:  Cognitive Cognitive Status: Able to follow simple commands, Alert and oriented to person, place, and time, Normal speech and language skills Cognitive/Intellectual Conditions Management [RPT]: None reported or documented in medical history or problem list      Neurological Neurological Review of Symptoms: No symptoms reported    HEENT HEENT Symptoms Reported: No symptoms reported      Cardiovascular Cardiovascular Symptoms Reported: No symptoms reported Does patient have uncontrolled Hypertension?: No Cardiovascular Management Strategies: Medication therapy, Exercise  Respiratory Respiratory Symptoms Reported: Productive cough Additional Respiratory Details: Cough productive of yellow phelgem about once a day    Endocrine Endocrine Symptoms Reported: No symptoms reported Is patient diabetic?: No    Gastrointestinal Gastrointestinal Symptoms Reported: No symptoms reported Additional Gastrointestinal Details: Patient reports a good appetite. Last BM today      Genitourinary Genitourinary Symptoms Reported: No symptoms reported     Integumentary Integumentary Symptoms Reported: No symptoms reported Skin Management Strategies: Coping strategies  Musculoskeletal Musculoskelatal Symptoms Reviewed: No symptoms reported Musculoskeletal Management Strategies: Exercise Falls in the past year?: No Number of falls in past year: 1 or less Was there an injury with Fall?: No Fall Risk Category Calculator: 0 Patient Fall Risk Level: Low Fall Risk Patient at Risk for Falls Due to: No Fall Risks Fall risk Follow up: Falls evaluation completed, Education provided  Psychosocial Psychosocial Symptoms Reported: No symptoms reported          05/08/2024    PHQ2-9 Depression Screening   Little interest or pleasure in doing things Not at all  Feeling down, depressed, or hopeless Not at all  PHQ-2 - Total Score 0  Trouble falling or staying asleep, or sleeping too much    Feeling tired or having little energy    Poor appetite or overeating     Feeling bad about yourself - or that you are a failure or have let yourself or your family down    Trouble concentrating on things, such as reading the newspaper or watching television    Moving or speaking so slowly that other people could have noticed.  Or the opposite - being so fidgety or restless that you have been moving around a lot more than usual    Thoughts that you would be better off dead, or hurting yourself in some way    PHQ2-9 Total Score    If you checked off any problems, how difficult have these problems made it for you to do  your work, take care of things at home, or get along with other people    Depression Interventions/Treatment      There were no vitals filed for this visit.  Medications Reviewed Today     Reviewed by Arno Rosaline SQUIBB, RN (Registered Nurse) on 05/08/24 at 1436  Med List Status: <None>   Medication Order Taking? Sig Documenting Provider Last Dose Status Informant  acetaminophen  (TYLENOL ) 500 MG tablet [id number] Yes Take 1,000 mg by mouth every  8 (eight) hours as needed for moderate pain. [provider]  Active Self  allopurinol  (ZYLOPRIM ) 100 MG tablet [id number] Yes TAKE 1 TABLET BY MOUTH DAILY Dahbura, Anton, DO  Active   amLODipine  (NORVASC ) 10 MG tablet [id number] Yes Take 1 tablet (10 mg total) by mouth at bedtime. Gomes, Adriana, DO  Active   atorvastatin  (LIPITOR) 40 MG tablet [id number] Yes Take 1 tablet (40 mg total) by mouth daily. Lonni Slain, MD  Active   ELIQUIS  5 MG TABS tablet [id number] Yes TAKE 1 TABLET(5 MG) BY MOUTH TWICE DAILY Lonni Slain, MD  Active   ezetimibe  (ZETIA ) 10 MG tablet [id number] Yes Take 1 tablet (10 mg total) by mouth daily. Swinyer, Rosaline HERO, NP  Active   losartan  (COZAAR ) 100 MG tablet [id number] Yes TAKE 1 TABLET(100 MG) BY MOUTH AT BEDTIME Lonni Slain, MD  Active   metoprolol  tartrate (LOPRESSOR ) 100 MG tablet [id number] Yes Take 1 tablet (100 mg total) by mouth 2 (two) times daily. Swinyer, Rosaline HERO, NP  Active   nitroGLYCERIN  (NITROSTAT ) 0.4 MG SL tablet [id number] Yes Place 1 tablet (0.4 mg total) under the tongue every 5 (five) minutes as needed for chest pain. Swinyer, Rosaline HERO, NP  Active             Recommendation:   Continue Current Plan of Care  Follow Up Plan:   Closing From:  Complex Care Management Patient has met all care management goals. Care Management case will be closed. Patient has been provided contact information should new needs arise.   Rosaline Arno, RN MSN [place]  [facility] Population Health RN Care Manager Direct Dial: [phone]  Fax: [phone]

## 2024-05-08 NOTE — Patient Instructions (Signed)
 Visit Information  Thank you for taking time to visit with me today. Please don't hesitate to contact me if I can be of assistance to you before our next scheduled appointment.  Your next care management appointment is no further scheduled appointments.    Closing From: Complex Care Management. Patient has met all care management goals. Care Management case will be closed. Patient has been provided contact information should new needs arise.   Please call the care guide team at [phone] if you need to cancel, schedule, or reschedule an appointment.   Please call the Suicide and Crisis Lifeline: 988 call 1-800-273-TALK (toll free, 24 hour hotline) if you are experiencing a Mental Health or Behavioral Health Crisis or need someone to talk to.  Rosaline Finlay, RN MSN [place]  [facility] Population Health RN Care Manager Direct Dial: [phone]  Fax: [phone]

## 2024-05-29 ENCOUNTER — Telehealth: Payer: Self-pay

## 2024-05-29 NOTE — Patient Outreach (Signed)
 Notified by care guide that patient called in concerned about a call he got from a woman claiming he has an overdue balance with [employer].  Called patient. Patient reports that he was told he has to renew his insurance on October 1st due to policy changing. He was told that he owes [employer] $4000 but when he calls [employer] they tell him he does not have a bill. Patient reports he had [employer] 2-3 years ago but is now with [employer]. He has not received any bills in the mail.  Contacted Medicare with patient present at [phone], number provided by patient. Spoke with representative. Per representative, patient was enrolled with [facility] October 2023-October 2024 but does not show a premium for that plan. Representative states that the call he is receiving does not appear to be legitimate. She states that if he does have a balance, he would have received a bill in the mail. Patient verbalizes his understanding and expresses gratitude for the reassurance.  Patient denies other issues or concerns at this time. Encouraged patient to contact he care team for any other needs.  Rosaline Finlay, RN MSN [place]  [facility] Population Health RN Care Manager Direct Dial: [phone]  Fax: [phone]

## 2024-07-13 ENCOUNTER — Telehealth: Payer: Self-pay | Admitting: Cardiology

## 2024-07-13 DIAGNOSIS — I480 Paroxysmal atrial fibrillation: Secondary | ICD-10-CM

## 2024-07-13 MED ORDER — APIXABAN 5 MG PO TABS: 5.0000 mg | ORAL_TABLET | Freq: Two times a day (BID) | ORAL | 1 refills | Status: AC

## 2024-07-13 NOTE — Telephone Encounter (Signed)
 Patient completely out, refilled as requested

## 2024-07-13 NOTE — Telephone Encounter (Signed)
 Pt c/o medication issue:  1. Name of Medication:   ELIQUIS  5 MG TABS tablet    2. How are you currently taking this medication (dosage and times per day)?  TAKE 1 TABLET(5 MG) BY MOUTH TWICE DAILY      3. Are you having a reaction (difficulty breathing--STAT)? No  4. What is your medication issue? Pt stated he's missed 3 days of this medication and was told if he missed 1 dose he could possibly have a stroke. Pt is very worried and concerned. Please advise

## 2024-07-13 NOTE — Telephone Encounter (Signed)
*  STAT* If patient is at the pharmacy, call can be transferred to refill team.   1. Which medications need to be refilled? (please list name of each medication and dose if known)   ELIQUIS  5 MG TABS tablet    2. Which pharmacy/location (including street and city if local pharmacy) is medication to be sent to?  [id number] - [place], [place] - [address] AT [facility] [address]      3. Do they need a 30 day or 90 day supply? 90 day    Pt is out of medication

## 2024-07-30 ENCOUNTER — Encounter

## 2024-08-10 ENCOUNTER — Telehealth: Payer: Self-pay | Admitting: Cardiology

## 2024-08-10 NOTE — Telephone Encounter (Signed)
 Pt c/o of Chest Pain: STAT if active (IN THIS MOMENT) CP, including tightness, pressure, jaw pain, shoulder/upper arm/back pain, SOB, nausea, and vomiting.  1. Are you having CP right now (tightness, pressure, or discomfort)? No   2. Are you experiencing any other symptoms (ex. SOB, nausea, vomiting, sweating)? Dizziness, SOB only with exertion   3. How long have you been experiencing CP?  2 months   4. Is your CP continuous or coming and going? Coming and going   5. Have you taken Nitroglycerin ? He has some but hasn't taken it recently   6. If CP returns before callback, please consider calling 911. ?  Pt called in stating he can't think straight anymore and he keep making mistakes. He started talking about his insurance. I asked if if he was having any cardiac symptoms. He states it has had some CP.

## 2024-08-10 NOTE — Telephone Encounter (Signed)
 Called and spoke w patient. He states that I'm about shot, for the last couple weeks I just feel different. Asked if he's seen PCP - I don't know who it is.  Then went on about having 2 different insurances and the cost monthly and that I'm really gonna be screwed next year.  Attemped phone assessment again.   Chest pain - its hard to breath You're short of breath? Well I have gained some weight, it's uncomfortable to get a breath.  I had blood in my stool 2 months ago now I've got blood in my urine. Any swelling - in my neck  -rectal bleeding- dark, cranberry colored water in toilet.  None recently -hematuria - cloudy red I thought it was discolored by my medicines, but its blood.  Confirmed he is taking Eliquis  as prescribed.  Adv importance of reporting any bleeding while on Eliquis .   I gave him the PCP name listed, as well as her phone number listed. He asked me about what we have listed as his insurance and he said he's never heard of it.  I adv to contact the number on back of his card --but first contact PCP.

## 2024-08-10 NOTE — Telephone Encounter (Signed)
 His primary care is [facility] with phone number [phone] if still needed.  Per dispense report he is picking up Amlodipine , Atorvastatin , Zetia , Losartan , Metoprolol  Tartrate as prescribed.  He picked up 90 day supply of Eliquis  on 07/14/24 however, his dispense before then was 03/20/24 leaving a 3 week gap between the fills.   Primary care on 01/31/24 ordered fecal occult blood as he declined colonoscopy, no reported bleeding at that time.   He is overdue for lipid panel after addition of Zetia  and last CBC >1 year ago on 05/2023.   Agree he needs to follow up with PCP.  Given myriad of concerns and poor historian, recommend scheduling sooner cardiology follow up.  Would also recommend labs this week for fasting lipid panel, CMP, CBC.  Milany Geck S Hunner Garcon, NP

## 2024-08-10 NOTE — Telephone Encounter (Signed)
 Called to ask patient to schedule appointment.  Could use 48 hr acute slot Wed for Dr. Lonni.    No answer, no voicemail.  Will try again.

## 2024-08-11 NOTE — Telephone Encounter (Signed)
 Scheduled appointment with Glendia ORN PA for tomorrow  Patient aware of date, time, and location

## 2024-08-11 NOTE — Progress Notes (Deleted)
{  This patient may be at risk for Amyloid. He has one or more dx on the prob list or PMH from the following list -  Abnormal EKG, HFpEF/Diastolic CHF, Aortic Stenosis, LVH, Bilateral Carpal Tunnel Syndrome, Biceps Tendon Rupture, Spinal Stenosis, Pericardial Effusion, Left Atrial Enlargement, Conduction System Disorder. See list below or review PMH.  Diagnoses From Problem List           Noted     Chronic diastolic heart failure (HCC) 05/28/2022     Paroxysmal A-fib (HCC) Unknown    Click HERE to open Cardiac Amyloid Screening SmartSet to order screening OR Click HERE to defer testing for 1 year or permanently :1}     OFFICE NOTE:    Date:  08/11/2024  ID:  Dean Morris, DOB Aug 05, 1956, MRN [id number] PCP: Janna Ferrier, DO  [facility] Providers Cardiologist:  Shelda Bruckner, MD { Click to update primary MD,subspecialty MD or APP then REFRESH:1}       Coronary artery disease S/p stent to OM in 2013  (HFpEF) heart failure with preserved ejection fraction TTE 01/01/24: EF 55-60, no RWMA, NL RVSF, trivial AI, AV sclerosis, mean AV 4 mmHg, ascending aorta 43 mm Paroxysmal atrial fibrillation  Chronic kidney disease  Hypertension  Hyperlipidemia  OSA intol of CPAP Depression Gout  GERD        Discussed the use of AI scribe software for clinical note transcription with the patient, who gave verbal consent to proceed. History of Present Illness Dean Morris is a 68 y.o. male for evaluation of chest pain. He called in recently with chest pain, shortness of breath and blood in stool.     ROS-See HPI***    Studies Reviewed:      *** Results  Risk Assessment/Calculations: {Does this patient have ATRIAL FIBRILLATION?:[phone]} No BP recorded.  {Refresh Note OR Click here to enter BP  :1}***      Physical Exam:  VS:  There were no vitals taken for this visit.       Wt Readings from Last 3 Encounters:  03/03/24 236 lb 12.8 oz (107.4 kg)  02/07/24 234 lb 12.8  oz (106.5 kg)  12/23/23 236 lb (107 kg)    Physical Exam***     Assessment and Plan:    Assessment & Plan Precordial chest pain  Coronary artery disease of native artery of native heart with stable angina pectoris  Chronic heart failure with preserved ejection fraction (HFpEF) (HCC)  Paroxysmal A-fib (HCC)  Assessment and Plan Assessment & Plan    {      :1}    {Are you ordering a CV Procedure (e.g. stress test, cath, DCCV, TEE, etc)?   Press F2        :[id number]}  Dispo:  No follow-ups on file.  Signed, Glendia Ferrier, PA-C

## 2024-08-11 NOTE — Telephone Encounter (Signed)
 Tried to call patient x 2, unable to reach our leave voicemail   Will try again later

## 2024-08-12 ENCOUNTER — Ambulatory Visit: Payer: Medicare (Managed Care) | Admitting: Physician Assistant

## 2024-08-12 DIAGNOSIS — I25118 Atherosclerotic heart disease of native coronary artery with other forms of angina pectoris: Secondary | ICD-10-CM

## 2024-08-12 DIAGNOSIS — I5032 Chronic diastolic (congestive) heart failure: Secondary | ICD-10-CM

## 2024-08-12 DIAGNOSIS — I480 Paroxysmal atrial fibrillation: Secondary | ICD-10-CM

## 2024-08-12 DIAGNOSIS — R072 Precordial pain: Secondary | ICD-10-CM

## 2024-09-09 ENCOUNTER — Other Ambulatory Visit (HOSPITAL_COMMUNITY): Payer: Self-pay
